# Patient Record
Sex: Female | Born: 1945 | Race: White | Hispanic: No | Marital: Married | Smoking: Current every day smoker
Health system: Southern US, Community
[De-identification: ages and names within clinical notes are randomized; demographics above are authoritative.]

## PROBLEM LIST (undated history)

## (undated) DIAGNOSIS — T7840XA Allergy, unspecified, initial encounter: Secondary | ICD-10-CM

## (undated) DIAGNOSIS — J45909 Unspecified asthma, uncomplicated: Secondary | ICD-10-CM

## (undated) DIAGNOSIS — E079 Disorder of thyroid, unspecified: Secondary | ICD-10-CM

## (undated) DIAGNOSIS — M1990 Unspecified osteoarthritis, unspecified site: Secondary | ICD-10-CM

## (undated) HISTORY — PX: OTHER SURGICAL HISTORY: SHX169

## (undated) HISTORY — PX: BREAST SURGERY: SHX581

## (undated) HISTORY — DX: Unspecified osteoarthritis, unspecified site: M19.90

## (undated) HISTORY — DX: Allergy, unspecified, initial encounter: T78.40XA

## (undated) HISTORY — PX: KNEE SURGERY: SHX244

## (undated) HISTORY — DX: Unspecified asthma, uncomplicated: J45.909

## (undated) HISTORY — DX: Disorder of thyroid, unspecified: E07.9

---

## 1969-06-09 HISTORY — PX: BREAST BIOPSY: SHX20

## 2011-07-24 ENCOUNTER — Ambulatory Visit: Payer: Self-pay

## 2011-09-22 ENCOUNTER — Ambulatory Visit: Payer: Self-pay | Admitting: Family Medicine

## 2011-09-22 LAB — TSH: Thyroid Stimulating Horm: 1.55 u[IU]/mL

## 2011-09-22 LAB — CBC WITH DIFFERENTIAL/PLATELET
Basophil %: 1.1 %
Eosinophil #: 0.2 10*3/uL (ref 0.0–0.7)
Eosinophil %: 3.1 %
HCT: 39.3 % (ref 35.0–47.0)
HGB: 13.1 g/dL (ref 12.0–16.0)
MCH: 31.6 pg (ref 26.0–34.0)
MCHC: 33.3 g/dL (ref 32.0–36.0)
Monocyte %: 6.6 %
Neutrophil #: 3.7 10*3/uL (ref 1.4–6.5)
Neutrophil %: 57.5 %
Platelet: 200 10*3/uL (ref 150–440)
RBC: 4.14 10*6/uL (ref 3.80–5.20)
RDW: 12.5 % (ref 11.5–14.5)
WBC: 6.4 10*3/uL (ref 3.6–11.0)

## 2011-09-22 LAB — COMPREHENSIVE METABOLIC PANEL
BUN: 12 mg/dL (ref 7–18)
Calcium, Total: 9.1 mg/dL (ref 8.5–10.1)
Chloride: 106 mmol/L (ref 98–107)
Co2: 28 mmol/L (ref 21–32)
Creatinine: 0.73 mg/dL (ref 0.60–1.30)
EGFR (African American): >60
Glucose: 100 mg/dL — ABNORMAL HIGH (ref 65–99)
Osmolality: 287 (ref 275–301)
Potassium: 3.9 mmol/L (ref 3.5–5.1)
SGOT(AST): 23 U/L (ref 15–37)
Total Protein: 6.8 g/dL (ref 6.4–8.2)

## 2011-09-22 LAB — LIPID PANEL
Cholesterol: 183 mg/dL (ref 0–200)
HDL Cholesterol: 63 mg/dL — ABNORMAL HIGH (ref 40–60)
Ldl Cholesterol, Calc: 102 mg/dL — ABNORMAL HIGH (ref 0–100)
Triglycerides: 90 mg/dL (ref 0–200)
VLDL Cholesterol, Calc: 18 mg/dL (ref 5–40)

## 2012-03-18 ENCOUNTER — Ambulatory Visit: Payer: Self-pay | Admitting: Unknown Physician Specialty

## 2012-03-18 LAB — CBC WITH DIFFERENTIAL/PLATELET
Eosinophil %: 0.6 %
HGB: 7.7 g/dL — ABNORMAL LOW (ref 12.0–16.0)
Lymphocyte %: 20.1 %
MCHC: 33.6 g/dL (ref 32.0–36.0)
MCV: 97 fL (ref 80–100)
Monocyte %: 6 %
Platelet: 387 10*3/uL (ref 150–440)
RDW: 15.7 % — ABNORMAL HIGH (ref 11.5–14.5)
WBC: 7.3 10*3/uL (ref 3.6–11.0)

## 2012-03-19 ENCOUNTER — Ambulatory Visit: Payer: Self-pay | Admitting: Unknown Physician Specialty

## 2012-03-19 HISTORY — PX: COLONOSCOPY: SHX174

## 2012-03-24 LAB — PATHOLOGY REPORT

## 2012-05-03 ENCOUNTER — Ambulatory Visit: Payer: Self-pay | Admitting: Family Medicine

## 2012-05-03 LAB — CBC WITH DIFFERENTIAL/PLATELET
Eosinophil %: 1.6 %
HCT: 36.9 % (ref 35.0–47.0)
Lymphocyte %: 18 %
MCH: 29.6 pg (ref 26.0–34.0)
MCHC: 32.8 g/dL (ref 32.0–36.0)
Monocyte #: 0.6 x10 3/mm (ref 0.2–0.9)
Monocyte %: 6.5 %
Neutrophil %: 73 %
Platelet: 225 10*3/uL (ref 150–440)
RBC: 4.09 10*6/uL (ref 3.80–5.20)
RDW: 16.4 % — ABNORMAL HIGH (ref 11.5–14.5)

## 2012-09-20 ENCOUNTER — Ambulatory Visit: Payer: Self-pay | Admitting: Family Medicine

## 2012-09-20 LAB — CBC WITH DIFFERENTIAL/PLATELET
Basophil #: 0.1 10*3/uL (ref 0.0–0.1)
Basophil %: 0.6 %
Eosinophil #: 0.1 10*3/uL (ref 0.0–0.7)
Eosinophil %: 1.1 %
HCT: 37.9 % (ref 35.0–47.0)
Lymphocyte %: 23.9 %
MCH: 32.4 pg (ref 26.0–34.0)
MCV: 96 fL (ref 80–100)
Monocyte #: 0.6 x10 3/mm (ref 0.2–0.9)
Monocyte %: 6.8 %
Neutrophil #: 6.2 10*3/uL (ref 1.4–6.5)
Neutrophil %: 67.6 %
Platelet: 233 10*3/uL (ref 150–440)
RBC: 3.95 10*6/uL (ref 3.80–5.20)

## 2012-09-20 LAB — TSH: Thyroid Stimulating Horm: 0.798 u[IU]/mL

## 2013-04-11 ENCOUNTER — Ambulatory Visit: Payer: Self-pay | Admitting: Family Medicine

## 2013-04-11 LAB — TSH: Thyroid Stimulating Horm: 1.27 u[IU]/mL

## 2013-04-13 ENCOUNTER — Ambulatory Visit: Payer: Self-pay | Admitting: Family Medicine

## 2014-03-08 ENCOUNTER — Ambulatory Visit: Payer: Self-pay | Admitting: Family Medicine

## 2014-03-08 LAB — CBC WITH DIFFERENTIAL/PLATELET
BASOS PCT: 1.4 %
Basophil #: 0.2 10*3/uL — ABNORMAL HIGH (ref 0.0–0.1)
EOS ABS: 0.1 10*3/uL (ref 0.0–0.7)
Eosinophil %: 0.8 %
HCT: 43.9 % (ref 35.0–47.0)
HGB: 14.6 g/dL (ref 12.0–16.0)
LYMPHS PCT: 26.5 %
Lymphocyte #: 3.5 10*3/uL (ref 1.0–3.6)
MCH: 32.4 pg (ref 26.0–34.0)
MCHC: 33.3 g/dL (ref 32.0–36.0)
MCV: 97 fL (ref 80–100)
Monocyte #: 1 x10 3/mm — ABNORMAL HIGH (ref 0.2–0.9)
Monocyte %: 7.6 %
Neutrophil #: 8.3 10*3/uL — ABNORMAL HIGH (ref 1.4–6.5)
Neutrophil %: 63.7 %
Platelet: 246 10*3/uL (ref 150–440)
RBC: 4.51 10*6/uL (ref 3.80–5.20)
RDW: 12.1 % (ref 11.5–14.5)
WBC: 13.1 10*3/uL — ABNORMAL HIGH (ref 3.6–11.0)

## 2014-03-08 LAB — COMPREHENSIVE METABOLIC PANEL
ALK PHOS: 69 U/L
ALT: 19 U/L
ANION GAP: 9 (ref 7–16)
AST: 20 U/L (ref 15–37)
Albumin: 3.5 g/dL (ref 3.4–5.0)
BUN: 17 mg/dL (ref 7–18)
Bilirubin,Total: 0.3 mg/dL (ref 0.2–1.0)
CALCIUM: 9.6 mg/dL (ref 8.5–10.1)
Chloride: 104 mmol/L (ref 98–107)
Co2: 26 mmol/L (ref 21–32)
Creatinine: 0.8 mg/dL (ref 0.60–1.30)
EGFR (African American): >60
Glucose: 106 mg/dL — ABNORMAL HIGH (ref 65–99)
Osmolality: 280 (ref 275–301)
Potassium: 3.9 mmol/L (ref 3.5–5.1)
Sodium: 139 mmol/L (ref 136–145)
TOTAL PROTEIN: 6.7 g/dL (ref 6.4–8.2)

## 2014-03-08 LAB — LIPID PANEL
Cholesterol: 201 mg/dL — ABNORMAL HIGH (ref 0–200)
HDL Cholesterol: 67 mg/dL — ABNORMAL HIGH (ref 40–60)
Ldl Cholesterol, Calc: 106 mg/dL — ABNORMAL HIGH (ref 0–100)
TRIGLYCERIDES: 140 mg/dL (ref 0–200)
VLDL Cholesterol, Calc: 28 mg/dL (ref 5–40)

## 2014-03-08 LAB — TSH: Thyroid Stimulating Horm: 1.11 u[IU]/mL

## 2014-03-27 ENCOUNTER — Ambulatory Visit: Payer: Self-pay | Admitting: Family Medicine

## 2014-03-27 LAB — CBC WITH DIFFERENTIAL/PLATELET
Basophil #: 0.1 10*3/uL (ref 0.0–0.1)
Basophil %: 0.5 %
Eosinophil #: 0.1 10*3/uL (ref 0.0–0.7)
Eosinophil %: 1 %
HCT: 42.4 % (ref 35.0–47.0)
HGB: 14.2 g/dL (ref 12.0–16.0)
Lymphocyte #: 2.8 10*3/uL (ref 1.0–3.6)
Lymphocyte %: 26.8 %
MCH: 32.4 pg (ref 26.0–34.0)
MCHC: 33.5 g/dL (ref 32.0–36.0)
MCV: 97 fL (ref 80–100)
MONOS PCT: 5.5 %
Monocyte #: 0.6 x10 3/mm (ref 0.2–0.9)
NEUTROS ABS: 6.8 10*3/uL — AB (ref 1.4–6.5)
NEUTROS PCT: 66.2 %
Platelet: 236 10*3/uL (ref 150–440)
RBC: 4.39 10*6/uL (ref 3.80–5.20)
RDW: 12.1 % (ref 11.5–14.5)
WBC: 10.3 10*3/uL (ref 3.6–11.0)

## 2014-03-31 ENCOUNTER — Ambulatory Visit: Payer: Self-pay | Admitting: Family Medicine

## 2014-10-03 ENCOUNTER — Ambulatory Visit
Admit: 2014-10-03 | Disposition: A | Discharge status: Home / Self Care | Payer: Self-pay | Attending: Family Medicine | Admitting: Family Medicine

## 2014-10-03 LAB — CBC WITH DIFFERENTIAL/PLATELET
BASOS PCT: 0.6 %
Basophil #: 0.1 10*3/uL (ref 0.0–0.1)
Eosinophil #: 0.1 10*3/uL (ref 0.0–0.7)
Eosinophil %: 0.7 %
HCT: 41 % (ref 35.0–47.0)
HGB: 13.7 g/dL (ref 12.0–16.0)
LYMPHS ABS: 2.7 10*3/uL (ref 1.0–3.6)
Lymphocyte %: 24.1 %
MCH: 31.7 pg (ref 26.0–34.0)
MCHC: 33.3 g/dL (ref 32.0–36.0)
MCV: 95 fL (ref 80–100)
MONOS PCT: 5.4 %
Monocyte #: 0.6 x10 3/mm (ref 0.2–0.9)
Neutrophil #: 7.8 10*3/uL — ABNORMAL HIGH (ref 1.4–6.5)
Neutrophil %: 69.2 %
Platelet: 251 10*3/uL (ref 150–440)
RBC: 4.31 10*6/uL (ref 3.80–5.20)
RDW: 12.4 % (ref 11.5–14.5)
WBC: 11.2 10*3/uL — AB (ref 3.6–11.0)

## 2014-10-03 LAB — HEMOGLOBIN A1C: HEMOGLOBIN A1C: 5.3 %

## 2014-11-08 ENCOUNTER — Telehealth: Payer: Self-pay

## 2014-11-08 NOTE — Telephone Encounter (Signed)
Go ahead and refer her to [facility] for her neck pain.  Let me know if any problems with this referral.-jh

## 2014-11-08 NOTE — Telephone Encounter (Signed)
Patient called on 11/07/2014 requesting call back regarding neck pain. Patient states that she discussed this in April with Dr. Luan Pulling and was told to call back for referral if neck pain does not improve. I asked her if it was discussed where she would be sent and she said she was not sure but is willing to go wherever we decide to send her. I advised her that I will only call back if doctor requires me to schedule appt or give medical advise otherwise she will hear from specialist after referral is sent. Patient was also advised of EPIC changes and she will call us if she hears nothing for more than a week.

## 2014-11-10 ENCOUNTER — Other Ambulatory Visit: Payer: Self-pay | Admitting: Family Medicine

## 2014-11-10 ENCOUNTER — Other Ambulatory Visit: Payer: Self-pay

## 2014-11-10 DIAGNOSIS — M542 Cervicalgia: Secondary | ICD-10-CM

## 2014-11-17 ENCOUNTER — Telehealth: Payer: Self-pay | Admitting: Family Medicine

## 2014-11-17 NOTE — Telephone Encounter (Signed)
Pt, called  Wanting to  Know  About referral  For  Neck  Pain . PT call  Back # is  [phone].  Thanks

## 2014-11-20 NOTE — Telephone Encounter (Signed)
What I treated her for in April 2016 was muscular pain in neck.  There was no mention by me of any vascular problems.  There was no vascular abnormalities on her exam.  I am unsure of what she speaks.There was no indication that she had any carotid problems. I don't have anything documented that would justify to insurance a referral to Card. Or Vascular.  If she is having more problems, I probably need to see her again to discuss and evaluate her neck again.

## 2014-11-20 NOTE — Telephone Encounter (Signed)
Patient called about wanting referral that you discussed at April visit. She then called to check on this ref that I sent for neck pain on 11/10/2014. When I explained Ortho is working on this she said you told her it is Carotid pain and this should not be ortho. Advised to come in and she refused stating this was discussed at last OV.  I can cancel Ortho if need to but do we send to Cardio?

## 2014-11-21 NOTE — Telephone Encounter (Signed)
Left detailed message to call and schedule appt if she feels this is new symptom otherwise all we can do is send her to Ortho. [facility]

## 2014-11-24 ENCOUNTER — Ambulatory Visit: Payer: Self-pay | Admitting: Family Medicine

## 2014-11-28 ENCOUNTER — Encounter: Payer: Self-pay | Admitting: Family Medicine

## 2014-11-28 ENCOUNTER — Ambulatory Visit (INDEPENDENT_AMBULATORY_CARE_PROVIDER_SITE_OTHER): Payer: Medicare Other | Admitting: Family Medicine

## 2014-11-28 VITALS — BP 97/67 | HR 76 | Resp 16 | Ht 64.0 in | Wt 119.4 lb

## 2014-11-28 DIAGNOSIS — E039 Hypothyroidism, unspecified: Secondary | ICD-10-CM

## 2014-11-28 DIAGNOSIS — J4520 Mild intermittent asthma, uncomplicated: Secondary | ICD-10-CM | POA: Diagnosis not present

## 2014-11-28 DIAGNOSIS — R591 Generalized enlarged lymph nodes: Secondary | ICD-10-CM | POA: Diagnosis not present

## 2014-11-28 DIAGNOSIS — M542 Cervicalgia: Secondary | ICD-10-CM

## 2014-11-28 DIAGNOSIS — J45909 Unspecified asthma, uncomplicated: Secondary | ICD-10-CM | POA: Insufficient documentation

## 2014-11-28 MED ORDER — ALBUTEROL SULFATE HFA 108 (90 BASE) MCG/ACT IN AERS: 2.0000 | INHALATION_SPRAY | Freq: Four times a day (QID) | RESPIRATORY_TRACT | Status: DC | PRN

## 2014-11-28 NOTE — Progress Notes (Signed)
Name: Lindsey Golden   MRN: [id number]    DOB: [birth year]   Date:11/28/2014       Progress Note  Subjective  Chief Complaint  Chief Complaint  Patient presents with  . Neck Pain    2-3 months off and on.     HPI  [name] neck pain, on and off.  Less than 2 mos. Ago, but persistent.  Pain on awakening, but resolves.  Nothing makes it worse.   Past Medical History  Diagnosis Date  . Asthma     History  Substance Use Topics  . Smoking status: Current Some Day Smoker -- 0.25 packs/day    Types: Cigarettes  . Smokeless tobacco: Never Used  . Alcohol Use: No     Current outpatient prescriptions:  .  levothyroxine (SYNTHROID, LEVOTHROID) 75 MCG tablet, Take 75 mcg by mouth daily before breakfast., Disp: , Rfl:  .  albuterol (PROVENTIL HFA;VENTOLIN HFA) 108 (90 BASE) MCG/ACT inhaler, Inhale 2 puffs into the lungs every 6 (six) hours as needed for wheezing or shortness of breath., Disp: 1 Inhaler, Rfl: 12  Allergies  Allergen Reactions  . Aspirin Hives  . Penicillins Other (See Comments)    Review of Systems  Constitutional: Negative.  Negative for fever, chills and malaise/fatigue.  HENT: Negative.  Negative for congestion and sore throat.   Eyes: Negative.  Negative for blurred vision and double vision.  Respiratory: Negative.  Negative for shortness of breath and wheezing.   Cardiovascular: Negative.  Negative for chest pain and orthopnea.  Gastrointestinal: Negative.  Negative for abdominal pain.  Skin: Negative.  Negative for rash.  Neurological: Negative for weakness and headaches.      Objective  Filed Vitals:   11/28/14 0928  BP: 97/67  Pulse: 76  Resp: 16  Height: 5\' 4"  (1.626 m)  Weight: 119 lb 6.4 oz (54.159 kg)     Physical Exam  Constitutional: She is well-developed, well-nourished, and in no distress. No distress.  HENT:  Right Ear: Hearing, tympanic membrane, external ear and ear canal normal.  Left Ear: Hearing, tympanic  membrane, external ear and ear canal normal.  Nose: Nose normal.  Mouth/Throat: Uvula is midline, oropharynx is clear and moist and mucous membranes are normal.  Lymphadenopathy:       Head (right side): Submandibular (Minimally enlarded compared to [name].  No other masses.) adenopathy present.  Vitals reviewed.     Recent Results (from the past 2160 hour(s))  Hemoglobin A1c     Status: None   Collection Time: 10/03/14  9:37 AM  Result Value Ref Range   Hemoglobin A1C 5.3 %    Comment: 4.0-6.0 NOTE: New Reference Range  08/15/14   CBC with Differential/Platelet     Status: Abnormal   Collection Time: 10/03/14  9:37 AM  Result Value Ref Range   WBC 11.2 (H) 3.6-11.0 x10 3/mm 3   RBC 4.31 3.80-5.20 X10 6/mm 3   HGB 13.7 12.0-16.0 g/dL   HCT 41.0 35.0-47.0 %   MCV 95 80-100 fL   MCH 31.7 26.0-34.0 pg   MCHC 33.3 32.0-36.0 g/dL   RDW 12.4 11.5-14.5 %   Platelet 251 150-440 x10 3/mm 3   Neutrophil % 69.2 %   Lymphocyte % 24.1 %   Monocyte % 5.4 %   Eosinophil % 0.7 %   Basophil % 0.6 %   Neutrophil # 7.8 (H) 1.4-6.5 x10 3/mm 3   Lymphocyte # 2.7 1.0-3.6 x10 3/mm 3  Monocyte # 0.6 0.2-0.9 x10 3/mm    Eosinophil # 0.1 0.0-0.7 x10 3/mm 3   Basophil # 0.1 0.0-0.1 x10 3/mm 3     Assessment & Plan  Neck pain Lymphadenopathy 2. Hypothyroidism, unspecified hypothyroidism type   3. Asthma, mild intermittent, uncomplicated  - albuterol (PROVENTIL HFA;VENTOLIN HFA) 108 (90 BASE) MCG/ACT inhaler; Inhale 2 puffs into the lungs every 6 (six) hours as needed for wheezing or shortness of breath.  Dispense: 1 Inhaler; Refill: 12

## 2014-11-28 NOTE — Patient Instructions (Signed)
Hot wet compresses to neck PRN.  Also Tylenol, 500 mg., 2 up to three times a day PRN.

## 2014-11-29 ENCOUNTER — Telehealth: Payer: Self-pay | Admitting: Family Medicine

## 2014-11-29 DIAGNOSIS — J45909 Unspecified asthma, uncomplicated: Secondary | ICD-10-CM

## 2014-11-29 NOTE — Telephone Encounter (Signed)
Pt states during her 11/28/14 appointment with Dr Luan Pulling they discussed an inhaler that would be covered by Medicare. Pt states she would like for the Medicare covered inhaler to be called into her pharmacy.  [id number] - [place], [address]

## 2014-11-30 NOTE — Telephone Encounter (Signed)
Please review and let me know if she needs appt.[facility]

## 2014-12-01 NOTE — Telephone Encounter (Signed)
She was given a Ventolin inhaler.  I guess change this to a ProAir inhaler to her pharmacy.   Ask if covered better by Medicare.-jh

## 2014-12-07 MED ORDER — ALBUTEROL SULFATE HFA 108 (90 BASE) MCG/ACT IN AERS: 2.0000 | INHALATION_SPRAY | Freq: Four times a day (QID) | RESPIRATORY_TRACT | Status: DC | PRN

## 2014-12-07 NOTE — Telephone Encounter (Signed)
Done. Covered level 8

## 2015-03-06 ENCOUNTER — Other Ambulatory Visit: Payer: Self-pay | Admitting: Family Medicine

## 2015-03-06 DIAGNOSIS — J45909 Unspecified asthma, uncomplicated: Secondary | ICD-10-CM

## 2015-03-06 MED ORDER — ALBUTEROL SULFATE HFA 108 (90 BASE) MCG/ACT IN AERS: 2.0000 | INHALATION_SPRAY | Freq: Four times a day (QID) | RESPIRATORY_TRACT | Status: DC | PRN

## 2015-03-06 NOTE — Telephone Encounter (Signed)
Received refill request from [facility] for [place]. Rx submitted.[place]

## 2015-04-05 ENCOUNTER — Ambulatory Visit: Payer: Medicare Other | Admitting: Family Medicine

## 2015-04-05 ENCOUNTER — Ambulatory Visit (INDEPENDENT_AMBULATORY_CARE_PROVIDER_SITE_OTHER): Payer: Medicare Other

## 2015-04-05 DIAGNOSIS — Z23 Encounter for immunization: Secondary | ICD-10-CM | POA: Diagnosis not present

## 2015-04-05 DIAGNOSIS — E039 Hypothyroidism, unspecified: Secondary | ICD-10-CM

## 2015-04-05 DIAGNOSIS — E785 Hyperlipidemia, unspecified: Secondary | ICD-10-CM

## 2015-04-05 DIAGNOSIS — R739 Hyperglycemia, unspecified: Secondary | ICD-10-CM

## 2015-04-05 DIAGNOSIS — J45909 Unspecified asthma, uncomplicated: Secondary | ICD-10-CM

## 2015-04-09 DIAGNOSIS — J45909 Unspecified asthma, uncomplicated: Secondary | ICD-10-CM | POA: Diagnosis not present

## 2015-04-09 DIAGNOSIS — E039 Hypothyroidism, unspecified: Secondary | ICD-10-CM | POA: Diagnosis not present

## 2015-04-09 DIAGNOSIS — R7309 Other abnormal glucose: Secondary | ICD-10-CM | POA: Diagnosis not present

## 2015-04-09 DIAGNOSIS — E785 Hyperlipidemia, unspecified: Secondary | ICD-10-CM | POA: Diagnosis not present

## 2015-04-10 LAB — CBC WITH DIFFERENTIAL/PLATELET
BASOS: 1 %
Basophils Absolute: 0 10*3/uL (ref 0.0–0.2)
EOS (ABSOLUTE): 0.2 10*3/uL (ref 0.0–0.4)
Eos: 2 %
Hematocrit: 44 % (ref 34.0–46.6)
Hemoglobin: 14.3 g/dL (ref 11.1–15.9)
Immature Grans (Abs): 0 10*3/uL (ref 0.0–0.1)
Immature Granulocytes: 0 %
Lymphocytes Absolute: 2.4 10*3/uL (ref 0.7–3.1)
Lymphs: 32 %
MCH: 31.6 pg (ref 26.6–33.0)
MCHC: 32.5 g/dL (ref 31.5–35.7)
MCV: 97 fL (ref 79–97)
MONOS ABS: 0.4 10*3/uL (ref 0.1–0.9)
Monocytes: 6 %
NEUTROS ABS: 4.4 10*3/uL (ref 1.4–7.0)
Neutrophils: 59 %
PLATELETS: 286 10*3/uL (ref 150–379)
RBC: 4.53 x10E6/uL (ref 3.77–5.28)
RDW: 12.6 % (ref 12.3–15.4)
WBC: 7.4 10*3/uL (ref 3.4–10.8)

## 2015-04-10 LAB — COMPREHENSIVE METABOLIC PANEL
A/G RATIO: 1.6 (ref 1.1–2.5)
ALT: 11 IU/L (ref 0–32)
AST: 23 IU/L (ref 0–40)
Albumin: 4 g/dL (ref 3.6–4.8)
Alkaline Phosphatase: 69 IU/L (ref 39–117)
BUN/Creatinine Ratio: 20 (ref 11–26)
BUN: 13 mg/dL (ref 8–27)
Bilirubin Total: 0.5 mg/dL (ref 0.0–1.2)
CHLORIDE: 101 mmol/L (ref 97–106)
CO2: 25 mmol/L (ref 18–29)
Calcium: 8.9 mg/dL (ref 8.7–10.3)
Creatinine, Ser: 0.65 mg/dL (ref 0.57–1.00)
GFR calc Af Amer: 106 mL/min/{1.73_m2} (ref >59)
GFR calc non Af Amer: 92 mL/min/{1.73_m2} (ref >59)
Globulin, Total: 2.5 g/dL (ref 1.5–4.5)
Glucose: 97 mg/dL (ref 65–99)
Potassium: 4.5 mmol/L (ref 3.5–5.2)
SODIUM: 141 mmol/L (ref 136–144)
TOTAL PROTEIN: 6.5 g/dL (ref 6.0–8.5)

## 2015-04-10 LAB — LIPID PANEL
CHOL/HDL RATIO: 3.2 ratio (ref 0.0–4.4)
Cholesterol, Total: 212 mg/dL — ABNORMAL HIGH (ref 100–199)
HDL: 67 mg/dL (ref >39)
LDL CALC: 115 mg/dL — AB (ref 0–99)
Triglycerides: 150 mg/dL — ABNORMAL HIGH (ref 0–149)
VLDL Cholesterol Cal: 30 mg/dL (ref 5–40)

## 2015-04-10 LAB — HEMOGLOBIN A1C: Hgb A1c MFr Bld: 5.6 % (ref 4.8–5.6)

## 2015-04-10 LAB — TSH: TSH: 1.77 u[IU]/mL (ref 0.450–4.500)

## 2015-06-07 ENCOUNTER — Other Ambulatory Visit: Payer: Self-pay | Admitting: Family Medicine

## 2015-06-08 ENCOUNTER — Telehealth: Payer: Self-pay | Admitting: Family Medicine

## 2015-06-08 MED ORDER — LEVOTHYROXINE SODIUM 75 MCG PO TABS: 75.0000 ug | ORAL_TABLET | Freq: Every day | ORAL | Status: DC

## 2015-06-08 NOTE — Telephone Encounter (Signed)
Pt needs a new prescription for lexothyroxine sent to [facility] in [place].  She will be out of medication tomorrow.  Her call back number is [phone]

## 2015-06-08 NOTE — Telephone Encounter (Signed)
Done

## 2015-08-09 ENCOUNTER — Other Ambulatory Visit: Payer: Self-pay | Admitting: Family Medicine

## 2015-08-09 MED ORDER — LEVOTHYROXINE SODIUM 75 MCG PO TABS: 75.0000 ug | ORAL_TABLET | Freq: Every day | ORAL | Status: DC

## 2015-11-26 ENCOUNTER — Encounter: Payer: Self-pay | Admitting: Family Medicine

## 2015-11-26 ENCOUNTER — Ambulatory Visit (INDEPENDENT_AMBULATORY_CARE_PROVIDER_SITE_OTHER): Payer: Medicare Other | Admitting: Family Medicine

## 2015-11-26 VITALS — BP 113/77 | HR 73 | Temp 98.1°F | Resp 16 | Ht 64.0 in | Wt 121.0 lb

## 2015-11-26 DIAGNOSIS — M1990 Unspecified osteoarthritis, unspecified site: Secondary | ICD-10-CM

## 2015-11-26 DIAGNOSIS — J45909 Unspecified asthma, uncomplicated: Secondary | ICD-10-CM

## 2015-11-26 DIAGNOSIS — E034 Atrophy of thyroid (acquired): Secondary | ICD-10-CM

## 2015-11-26 DIAGNOSIS — E038 Other specified hypothyroidism: Secondary | ICD-10-CM | POA: Diagnosis not present

## 2015-11-26 DIAGNOSIS — M170 Bilateral primary osteoarthritis of knee: Secondary | ICD-10-CM | POA: Insufficient documentation

## 2015-11-26 MED ORDER — LEVOTHYROXINE SODIUM 75 MCG PO TABS: 75.0000 ug | ORAL_TABLET | Freq: Every day | ORAL | Status: DC

## 2015-11-26 MED ORDER — ALBUTEROL SULFATE HFA 108 (90 BASE) MCG/ACT IN AERS: 2.0000 | INHALATION_SPRAY | Freq: Four times a day (QID) | RESPIRATORY_TRACT | Status: DC | PRN

## 2015-11-26 MED ORDER — MELOXICAM 7.5 MG PO TABS: 7.5000 mg | ORAL_TABLET | Freq: Every day | ORAL | Status: DC

## 2015-11-26 NOTE — Patient Instructions (Signed)
Plan CMP, CBC, TSH, Lipid panel on return

## 2015-11-26 NOTE — Progress Notes (Signed)
Name: Lindsey Golden   MRN: [id number]    DOB: 1945-12-24   Date:11/26/2015       Progress Note  Subjective  Chief Complaint  Chief Complaint  Patient presents with  . Hypothyroidism    HPI Here for f/u of hypothyroidism and Asthma.  She is a smoker and has not been able to stop.  She has to use her ProAir about once a day.  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  She has had multiple knee surgeries and needs total knee replacement.  She is having more problems with her knees.  No problem-specific assessment & plan notes found for this encounter.   Past Medical History  Diagnosis Date  . Asthma     History reviewed. No pertinent past surgical history.  Family History  Problem Relation Age of Onset  . Family history unknown: Yes    Social History   Social History  . Marital Status: Married    Spouse Name: N/A  . Number of Children: N/A  . Years of Education: N/A   Occupational History  . Not on file.   Social History Main Topics  . Smoking status: Current Some Day Smoker -- 0.50 packs/day    Types: Cigarettes  . Smokeless tobacco: Never Used  . Alcohol Use: No  . Drug Use: No  . Sexual Activity: No   Other Topics Concern  . Not on file   Social History Narrative     Current outpatient prescriptions:  .  albuterol (PROAIR HFA) 108 (90 Base) MCG/ACT inhaler, Inhale 2 puffs into the lungs every 6 (six) hours as needed for wheezing or shortness of breath. ProAir, Disp: 1 Inhaler, Rfl: 12 .  levothyroxine (SYNTHROID, LEVOTHROID) 75 MCG tablet, Take 1 tablet (75 mcg total) by mouth daily., Disp: 90 tablet, Rfl: 3 .  meloxicam (MOBIC) 7.5 MG tablet, Take 1 tablet (7.5 mg total) by mouth daily. Take with food, Disp: 30 tablet, Rfl: 6  Allergies  Allergen Reactions  . Aspirin Hives  . Penicillins Other (See Comments)     Review of Systems  Constitutional: Negative for fever, chills, weight loss and malaise/fatigue.   HENT: Negative for hearing loss.   Eyes: Negative for blurred vision and double vision.  Respiratory: Positive for cough (occ) and wheezing (with activity and heat). Negative for shortness of breath.   Cardiovascular: Negative for chest pain, palpitations and leg swelling.  Gastrointestinal: Negative for heartburn, abdominal pain and constipation.  Genitourinary: Negative for dysuria, urgency and hematuria.  Musculoskeletal: Positive for joint pain (knees).  Skin: Negative for rash.  Neurological: Negative for dizziness, tremors, weakness and headaches.      Objective  Filed Vitals:   11/26/15 1417  BP: 113/77  Pulse: 73  Temp: 98.1 F (36.7 C)  TempSrc: Oral  Resp: 16  Height: 5\' 4"  (1.626 m)  Weight: 121 lb (54.885 kg)    Physical Exam  Constitutional: She is oriented to person, place, and time and well-developed, well-nourished, and in no distress. No distress.  HENT:  Head: Normocephalic and atraumatic.  Eyes: Conjunctivae and EOM are normal. Pupils are equal, round, and reactive to light. No scleral icterus.  Neck: Normal range of motion. Neck supple. Carotid bruit is not present. No thyromegaly present.  Cardiovascular: Normal rate, regular rhythm and normal heart sounds.  Exam reveals no gallop and no friction rub.   No murmur heard. Pulmonary/Chest: Effort normal and breath sounds normal. No respiratory distress. She  has no wheezes. She has no rales.  Abdominal: Soft. Bowel sounds are normal. She exhibits no distension and no mass. There is no tenderness.  Musculoskeletal: She exhibits no edema.  Bilateral knee pain with ROM  Lymphadenopathy:    She has no cervical adenopathy.  Neurological: She is alert and oriented to person, place, and time.  Vitals reviewed.      No results found for this or any previous visit (from the past 2160 hour(s)).   Assessment & Plan  Problem List Items Addressed This Visit      Respiratory   Asthma - Primary   Relevant  Medications   albuterol (PROAIR HFA) 108 (90 Base) MCG/ACT inhaler     Endocrine   Hypothyroidism   Relevant Medications   levothyroxine (SYNTHROID, LEVOTHROID) 75 MCG tablet     Musculoskeletal and Integument   Arthritis   Relevant Medications   meloxicam (MOBIC) 7.5 MG tablet      Meds ordered this encounter  Medications  . levothyroxine (SYNTHROID, LEVOTHROID) 75 MCG tablet    Sig: Take 1 tablet (75 mcg total) by mouth daily.    Dispense:  90 tablet    Refill:  3  . albuterol (PROAIR HFA) 108 (90 Base) MCG/ACT inhaler    Sig: Inhale 2 puffs into the lungs every 6 (six) hours as needed for wheezing or shortness of breath. ProAir    Dispense:  1 Inhaler    Refill:  12  . meloxicam (MOBIC) 7.5 MG tablet    Sig: Take 1 tablet (7.5 mg total) by mouth daily. Take with food    Dispense:  30 tablet    Refill:  6   1. Asthma, unspecified asthma severity, uncomplicated  - albuterol (PROAIR HFA) 108 (90 Base) MCG/ACT inhaler; Inhale 2 puffs into the lungs every 6 (six) hours as needed for wheezing or shortness of breath. ProAir  Dispense: 1 Inhaler; Refill: 12 Discussed stopping smoking again. 2. Arthritis  - meloxicam (MOBIC) 7.5 MG tablet; Take 1 tablet (7.5 mg total) by mouth daily. Take with food  Dispense: 30 tablet; Refill: 6  3. Hypothyroidism due to acquired atrophy of thyroid  - levothyroxine (SYNTHROID, LEVOTHROID) 75 MCG tablet; Take 1 tablet (75 mcg total) by mouth daily.  Dispense: 90 tablet; Refill: 3

## 2016-02-15 ENCOUNTER — Other Ambulatory Visit: Payer: Self-pay | Admitting: Family Medicine

## 2016-02-15 ENCOUNTER — Other Ambulatory Visit: Payer: Self-pay | Admitting: *Deleted

## 2016-02-15 DIAGNOSIS — E039 Hypothyroidism, unspecified: Secondary | ICD-10-CM

## 2016-02-15 DIAGNOSIS — M542 Cervicalgia: Secondary | ICD-10-CM

## 2016-02-15 DIAGNOSIS — M1990 Unspecified osteoarthritis, unspecified site: Secondary | ICD-10-CM

## 2016-02-18 ENCOUNTER — Other Ambulatory Visit: Payer: Medicare Other

## 2016-02-18 ENCOUNTER — Other Ambulatory Visit: Payer: Self-pay | Admitting: Family Medicine

## 2016-02-18 DIAGNOSIS — M542 Cervicalgia: Secondary | ICD-10-CM | POA: Diagnosis not present

## 2016-02-18 DIAGNOSIS — E039 Hypothyroidism, unspecified: Secondary | ICD-10-CM | POA: Diagnosis not present

## 2016-02-18 DIAGNOSIS — M1990 Unspecified osteoarthritis, unspecified site: Secondary | ICD-10-CM | POA: Diagnosis not present

## 2016-02-18 LAB — CBC WITH DIFFERENTIAL/PLATELET
BASOS PCT: 0 %
Basophils Absolute: 0 cells/uL (ref 0–200)
EOS ABS: 192 {cells}/uL (ref 15–500)
Eosinophils Relative: 2 %
HEMATOCRIT: 40.7 % (ref 35.0–45.0)
HEMOGLOBIN: 13.7 g/dL (ref 11.7–15.5)
LYMPHS ABS: 2880 {cells}/uL (ref 850–3900)
LYMPHS PCT: 30 %
MCH: 31.8 pg (ref 27.0–33.0)
MCHC: 33.7 g/dL (ref 32.0–36.0)
MCV: 94.4 fL (ref 80.0–100.0)
MONO ABS: 672 {cells}/uL (ref 200–950)
MPV: 10.1 fL (ref 7.5–12.5)
Monocytes Relative: 7 %
NEUTROS ABS: 5856 {cells}/uL (ref 1500–7800)
Neutrophils Relative %: 61 %
Platelets: 264 10*3/uL (ref 140–400)
RBC: 4.31 MIL/uL (ref 3.80–5.10)
RDW: 12.3 % (ref 11.0–15.0)
WBC: 9.6 10*3/uL (ref 3.8–10.8)

## 2016-02-18 NOTE — Progress Notes (Signed)
Error

## 2016-02-19 LAB — COMPLETE METABOLIC PANEL WITH GFR
ALT: 10 U/L (ref 6–29)
AST: 19 U/L (ref 10–35)
Albumin: 3.7 g/dL (ref 3.6–5.1)
Alkaline Phosphatase: 50 U/L (ref 33–130)
BUN: 13 mg/dL (ref 7–25)
CALCIUM: 8.9 mg/dL (ref 8.6–10.4)
CHLORIDE: 106 mmol/L (ref 98–110)
CO2: 27 mmol/L (ref 20–31)
Creat: 0.6 mg/dL (ref 0.50–0.99)
GFR, Est Non African American: >89 mL/min (ref >=60)
Glucose, Bld: 108 mg/dL — ABNORMAL HIGH (ref 65–99)
POTASSIUM: 4.2 mmol/L (ref 3.5–5.3)
Sodium: 140 mmol/L (ref 135–146)
Total Bilirubin: 0.5 mg/dL (ref 0.2–1.2)
Total Protein: 5.9 g/dL — ABNORMAL LOW (ref 6.1–8.1)

## 2016-02-19 LAB — LIPID PANEL
CHOL/HDL RATIO: 2.6 ratio (ref <=5.0)
CHOLESTEROL: 180 mg/dL (ref 125–200)
HDL: 69 mg/dL (ref >=46)
LDL Cholesterol: 90 mg/dL (ref <130)
TRIGLYCERIDES: 106 mg/dL (ref <150)
VLDL: 21 mg/dL (ref <30)

## 2016-02-19 LAB — TSH: TSH: 1.06 mIU/L

## 2016-03-18 ENCOUNTER — Ambulatory Visit (INDEPENDENT_AMBULATORY_CARE_PROVIDER_SITE_OTHER): Payer: Medicare Other

## 2016-03-18 DIAGNOSIS — Z23 Encounter for immunization: Secondary | ICD-10-CM | POA: Diagnosis not present

## 2016-05-13 ENCOUNTER — Ambulatory Visit (INDEPENDENT_AMBULATORY_CARE_PROVIDER_SITE_OTHER): Payer: Medicare Other | Admitting: Family Medicine

## 2016-05-13 ENCOUNTER — Encounter: Payer: Self-pay | Admitting: Family Medicine

## 2016-05-13 VITALS — BP 124/78 | HR 81 | Temp 97.8°F | Resp 16 | Ht 64.0 in | Wt 120.0 lb

## 2016-05-13 DIAGNOSIS — J302 Other seasonal allergic rhinitis: Secondary | ICD-10-CM | POA: Diagnosis not present

## 2016-05-13 DIAGNOSIS — J4520 Mild intermittent asthma, uncomplicated: Secondary | ICD-10-CM

## 2016-05-13 DIAGNOSIS — J3089 Other allergic rhinitis: Secondary | ICD-10-CM | POA: Insufficient documentation

## 2016-05-13 MED ORDER — BUDESONIDE-FORMOTEROL FUMARATE 160-4.5 MCG/ACT IN AERO: 2.0000 | INHALATION_SPRAY | Freq: Two times a day (BID) | RESPIRATORY_TRACT | 12 refills | Status: DC

## 2016-05-13 MED ORDER — CETIRIZINE HCL 10 MG PO TABS: 10.0000 mg | ORAL_TABLET | Freq: Every day | ORAL | 11 refills | Status: DC

## 2016-05-13 NOTE — Progress Notes (Signed)
Name: Lindsey Golden   MRN: [id number]    DOB: 03-05-1946   Date:05/13/2016       Progress Note  Subjective  Chief Complaint  Chief Complaint  Patient presents with  . Asthma  . Allergies    HPI Here to discuss asthma.  She has had a little more allergic sx.  She is having to use rescue inhaler 2-3 times a day.  She has had no fever.  Cough not productive.  Rescue inhaler works well for [employer].  Some extra nasal congestion.  No problem-specific Assessment & Plan notes found for this encounter.   Past Medical History:  Diagnosis Date  . Asthma     History reviewed. No pertinent surgical history.  Family History  Problem Relation Age of Onset  . Depression Mother   . Hearing loss Mother   . Mental illness Mother   . Miscarriages / [place] Mother   . Asthma Father   . Kidney disease Father   . Stroke Father   . Varicose Veins Father     Social History   Social History  . Marital status: Married    Spouse name: N/A  . Number of children: N/A  . Years of education: N/A   Occupational History  . Not on file.   Social History Main Topics  . Smoking status: Current Some Day Smoker    Packs/day: 0.50    Types: Cigarettes  . Smokeless tobacco: Never Used  . Alcohol use No  . Drug use: No  . Sexual activity: No   Other Topics Concern  . Not on file   Social History Narrative  . No narrative on file     Current Outpatient Prescriptions:  .  acetaminophen (TYLENOL) 500 MG tablet, Take 500 mg by mouth every 6 (six) hours as needed., Disp: , Rfl:  .  albuterol (PROAIR HFA) 108 (90 Base) MCG/ACT inhaler, Inhale 2 puffs into the lungs every 6 (six) hours as needed for wheezing or shortness of breath. ProAir, Disp: 1 Inhaler, Rfl: 12 .  levothyroxine (SYNTHROID, LEVOTHROID) 75 MCG tablet, Take 1 tablet (75 mcg total) by mouth daily., Disp: 90 tablet, Rfl: 3 .  budesonide-formoterol (SYMBICORT) 160-4.5 MCG/ACT inhaler, Inhale 2 puffs into the lungs 2  (two) times daily., Disp: 1 Inhaler, Rfl: 12 .  cetirizine (ZYRTEC) 10 MG tablet, Take 1 tablet (10 mg total) by mouth daily., Disp: 30 tablet, Rfl: 11  Allergies  Allergen Reactions  . Aspirin Hives  . Penicillins Other (See Comments)     Review of Systems  Constitutional: Negative for chills, fever, malaise/fatigue and weight loss.  HENT: Negative for hearing loss and tinnitus.   Eyes: Negative for blurred vision and double vision.  Respiratory: Positive for cough (mild), shortness of breath (mild) and wheezing. Negative for sputum production.   Cardiovascular: Negative for chest pain, palpitations and leg swelling.  Gastrointestinal: Negative for abdominal pain, blood in stool and heartburn.  Genitourinary: Negative for dysuria, frequency and urgency.  Skin: Negative for rash.  Neurological: Negative for dizziness, tingling, tremors, weakness and headaches.      Objective  Vitals:   05/13/16 0810  BP: 124/78  Pulse: 81  Resp: 16  Temp: 97.8 F (36.6 C)  TempSrc: Oral  Weight: 120 lb (54.4 kg)  Height: 5' 4"  (1.626 m)    Physical Exam  Constitutional: She is oriented to person, place, and time and well-developed, well-nourished, and in no distress. No distress.  HENT:  Head: Normocephalic and  atraumatic.  Eyes: Conjunctivae and EOM are normal. Pupils are equal, round, and reactive to light. No scleral icterus.  Neck: Normal range of motion. Neck supple. Carotid bruit is not present. No thyromegaly present.  Cardiovascular: Normal rate, regular rhythm and normal heart sounds.  Exam reveals no gallop and no friction rub.   No murmur heard. Pulmonary/Chest: Effort normal and breath sounds normal. No respiratory distress. She has no wheezes. She has no rales.  Abdominal: Soft. Bowel sounds are normal. She exhibits no distension and no mass. There is no tenderness.  Musculoskeletal: She exhibits no edema.  Lymphadenopathy:    She has no cervical adenopathy.   Neurological: She is alert and oriented to person, place, and time.  Vitals reviewed.      Recent Results (from the past 2160 hour(s))  CBC with Differential     Status: None   Collection Time: 02/18/16  8:26 AM  Result Value Ref Range   WBC 9.6 3.8 - 10.8 K/uL   RBC 4.31 3.80 - 5.10 MIL/uL   Hemoglobin 13.7 11.7 - 15.5 g/dL   HCT 40.7 35.0 - 45.0 %   MCV 94.4 80.0 - 100.0 fL   MCH 31.8 27.0 - 33.0 pg   MCHC 33.7 32.0 - 36.0 g/dL   RDW 12.3 11.0 - 15.0 %   Platelets 264 140 - 400 K/uL   MPV 10.1 7.5 - 12.5 fL   Neutro Abs 5,856 1,500 - 7,800 cells/uL   Lymphs Abs 2,880 850 - 3,900 cells/uL   Monocytes Absolute 672 200 - 950 cells/uL   Eosinophils Absolute 192 15 - 500 cells/uL   Basophils Absolute 0 0 - 200 cells/uL   Neutrophils Relative % 61 %   Lymphocytes Relative 30 %   Monocytes Relative 7 %   Eosinophils Relative 2 %   Basophils Relative 0 %   Smear Review Criteria for review not met   COMPLETE METABOLIC PANEL WITH GFR     Status: Abnormal   Collection Time: 02/18/16  8:26 AM  Result Value Ref Range   Sodium 140 135 - 146 mmol/L   Potassium 4.2 3.5 - 5.3 mmol/L   Chloride 106 98 - 110 mmol/L   CO2 27 20 - 31 mmol/L   Glucose, Bld 108 (H) 65 - 99 mg/dL   BUN 13 7 - 25 mg/dL   Creat 0.60 0.50 - 0.99 mg/dL    Comment:   For patients > or = 68 years of age: The upper reference limit for Creatinine is approximately 13% higher for people identified as African-American.      Total Bilirubin 0.5 0.2 - 1.2 mg/dL   Alkaline Phosphatase 50 33 - 130 U/L   AST 19 10 - 35 U/L   ALT 10 6 - 29 U/L   Total Protein 5.9 (L) 6.1 - 8.1 g/dL   Albumin 3.7 3.6 - 5.1 g/dL   Calcium 8.9 8.6 - 10.4 mg/dL   GFR, Est African American >89 >=60 mL/min   GFR, Est Non African American >89 >=60 mL/min  Lipid Profile     Status: None   Collection Time: 02/18/16  8:26 AM  Result Value Ref Range   Cholesterol 180 125 - 200 mg/dL   Triglycerides 106 <150 mg/dL   HDL 69 >=46 mg/dL    Total CHOL/HDL Ratio 2.6 <=5.0 Ratio   VLDL 21 <30 mg/dL   LDL Cholesterol 90 <130 mg/dL    Comment:   Total Cholesterol/HDL Ratio:CHD Risk  Coronary Heart Disease Risk Table                                        Men       Women          1/2 Average Risk              3.4        3.3              Average Risk              5.0        4.4           2X Average Risk              9.6        7.1           3X Average Risk             23.4       11.0 Use the calculated Patient Ratio above and the CHD Risk table  to determine the patient's CHD Risk.   TSH     Status: None   Collection Time: 02/18/16  8:26 AM  Result Value Ref Range   TSH 1.06 mIU/L    Comment:   Reference Range   > or = 20 Years  0.40-4.50   Pregnancy Range First trimester  0.26-2.66 Second trimester 0.55-2.73 Third trimester  0.43-2.91        Assessment & Plan  Problem List Items Addressed This Visit      Respiratory   Asthma - Primary   Relevant Medications   budesonide-formoterol (SYMBICORT) 160-4.5 MCG/ACT inhaler   Seasonal allergies   Relevant Medications   cetirizine (ZYRTEC) 10 MG tablet      Meds ordered this encounter  Medications  . acetaminophen (TYLENOL) 500 MG tablet    Sig: Take 500 mg by mouth every 6 (six) hours as needed.  . budesonide-formoterol (SYMBICORT) 160-4.5 MCG/ACT inhaler    Sig: Inhale 2 puffs into the lungs 2 (two) times daily.    Dispense:  1 Inhaler    Refill:  12  . cetirizine (ZYRTEC) 10 MG tablet    Sig: Take 1 tablet (10 mg total) by mouth daily.    Dispense:  30 tablet    Refill:  11   1. Mild intermittent asthma, unspecified whether complicated  - budesonide-formoterol (SYMBICORT) 160-4.5 MCG/ACT inhaler; Inhale 2 puffs into the lungs 2 (two) times daily.  Dispense: 1 Inhaler; Refill: 12  2. Chronic seasonal allergic rhinitis, unspecified trigger  - cetirizine (ZYRTEC) 10 MG tablet; Take 1 tablet (10 mg total) by mouth daily.  Dispense:  30 tablet; Refill: 11

## 2016-09-02 ENCOUNTER — Encounter: Payer: Self-pay | Admitting: Family Medicine

## 2016-09-02 ENCOUNTER — Ambulatory Visit (INDEPENDENT_AMBULATORY_CARE_PROVIDER_SITE_OTHER): Payer: Medicare Other | Admitting: Family Medicine

## 2016-09-02 VITALS — BP 115/64 | HR 70 | Temp 98.0°F | Resp 16 | Ht 64.0 in | Wt 122.0 lb

## 2016-09-02 DIAGNOSIS — J302 Other seasonal allergic rhinitis: Secondary | ICD-10-CM

## 2016-09-02 DIAGNOSIS — J4520 Mild intermittent asthma, uncomplicated: Secondary | ICD-10-CM

## 2016-09-02 DIAGNOSIS — E034 Atrophy of thyroid (acquired): Secondary | ICD-10-CM

## 2016-09-02 DIAGNOSIS — M1990 Unspecified osteoarthritis, unspecified site: Secondary | ICD-10-CM | POA: Diagnosis not present

## 2016-09-02 DIAGNOSIS — R739 Hyperglycemia, unspecified: Secondary | ICD-10-CM

## 2016-09-02 DIAGNOSIS — S99921A Unspecified injury of right foot, initial encounter: Secondary | ICD-10-CM

## 2016-09-02 NOTE — Progress Notes (Signed)
Name: Lindsey Golden   MRN: [id number]    DOB: 1945/06/15   Date:09/02/2016       Progress Note  Subjective  Chief Complaint  Chief Complaint  Patient presents with  . Asthma  . Foot Pain  . Thyroid Problem    HPI Here for f/u of Hypothyroidism.  Also with asthma that is well controlled.  Injured her R toes/foot about 1 week ago.  His 1st an d 2nd toe against door stop.  Still bruised and sore.  No problem-specific Assessment & Plan notes found for this encounter.   Past Medical History:  Diagnosis Date  . Asthma     History reviewed. No pertinent surgical history.  Family History  Problem Relation Age of Onset  . Depression Mother   . Hearing loss Mother   . Mental illness Mother   . Miscarriages / [place] Mother   . Asthma Father   . Kidney disease Father   . Stroke Father   . Varicose Veins Father     Social History   Social History  . Marital status: Married    Spouse name: N/A  . Number of children: N/A  . Years of education: N/A   Occupational History  . Not on file.   Social History Main Topics  . Smoking status: Current Some Day Smoker    Packs/day: 0.50    Types: Cigarettes  . Smokeless tobacco: Never Used  . Alcohol use No  . Drug use: No  . Sexual activity: No   Other Topics Concern  . Not on file   Social History Narrative  . No narrative on file     Current Outpatient Prescriptions:  .  acetaminophen (TYLENOL) 500 MG tablet, Take 500 mg by mouth every 6 (six) hours as needed., Disp: , Rfl:  .  albuterol (PROAIR HFA) 108 (90 Base) MCG/ACT inhaler, Inhale 2 puffs into the lungs every 6 (six) hours as needed for wheezing or shortness of breath. ProAir, Disp: 1 Inhaler, Rfl: 12 .  budesonide-formoterol (SYMBICORT) 160-4.5 MCG/ACT inhaler, Inhale 2 puffs into the lungs 2 (two) times daily., Disp: 1 Inhaler, Rfl: 12 .  cetirizine (ZYRTEC) 10 MG tablet, Take 1 tablet (10 mg total) by mouth daily., Disp: 30 tablet, Rfl: 11 .   levothyroxine (SYNTHROID, LEVOTHROID) 75 MCG tablet, Take 1 tablet (75 mcg total) by mouth daily., Disp: 90 tablet, Rfl: 3  Allergies  Allergen Reactions  . Aspirin Hives  . Penicillins Other (See Comments)     Review of Systems  Constitutional: Negative for chills, fever, malaise/fatigue and weight loss.  HENT: Negative for hearing loss and tinnitus.   Eyes: Negative for blurred vision and double vision.  Respiratory: Negative for cough, shortness of breath and wheezing.   Cardiovascular: Negative for chest pain, palpitations and PND.  Gastrointestinal: Negative for abdominal pain, blood in stool and heartburn.  Genitourinary: Negative for dysuria, frequency and urgency.  Musculoskeletal: Positive for joint pain (R toes). Negative for myalgias.  Skin: Negative for rash.  Neurological: Negative for dizziness, tingling, tremors, weakness and headaches.      Objective  Vitals:   09/02/16 0847  BP: 115/64  Pulse: 70  Resp: 16  Temp: 98 F (36.7 C)  TempSrc: Oral  Weight: 122 lb (55.3 kg)  Height: 5\' 4"  (1.626 m)    Physical Exam  Constitutional: She is well-developed, well-nourished, and in no distress. No distress.  HENT:  Head: Normocephalic and atraumatic.  Eyes: Conjunctivae and EOM are normal.  Pupils are equal, round, and reactive to light. No scleral icterus.  Neck: Normal range of motion. Neck supple. Carotid bruit is not present. No thyromegaly present.  Cardiovascular: Normal rate and regular rhythm.  Exam reveals no gallop and no friction rub.   No murmur heard. Pulmonary/Chest: Effort normal and breath sounds normal. No respiratory distress. She has no wheezes. She has no rales.  Musculoskeletal: She exhibits no edema.  R 1st and 2nd toes swollen with bruising at bases.  Some tenderness at base of 2nd toe.  No abnormal bone palpation.  Lymphadenopathy:    She has no cervical adenopathy.  Neurological: She is alert.  Vitals reviewed.      No results  found for this or any previous visit (from the past 2160 hour(s)).   Assessment & Plan  Problem List Items Addressed This Visit      Respiratory   Asthma   Seasonal allergies     Endocrine   Hypothyroidism - Primary   Relevant Orders   COMPLETE METABOLIC PANEL WITH GFR   CBC with Differential   Lipid Profile   TSH     Musculoskeletal and Integument   Arthritis    Other Visit Diagnoses    Injury of right foot, initial encounter       Elevated blood sugar       Relevant Orders   HgB A1c      No orders of the defined types were placed in this encounter.  1. Hypothyroidism due to acquired atrophy of thyroid  - COMPLETE METABOLIC PANEL WITH GFR - CBC with Differential - Lipid Profile - TSH Cont Levothyroxine 2. Mild intermittent asthma, unspecified whether complicated Cont inhalers  3. Chronic seasonal allergic rhinitis, unspecified trigger   4. Arthritis   5. Injury of right foot, initial encounter Rest foot.  Use orthotic shoe.  Buddy tape  6. Elevated blood sugar  - HgB A1c

## 2016-09-03 ENCOUNTER — Other Ambulatory Visit: Payer: Medicare Other

## 2016-09-03 DIAGNOSIS — R739 Hyperglycemia, unspecified: Secondary | ICD-10-CM | POA: Diagnosis not present

## 2016-09-03 DIAGNOSIS — E034 Atrophy of thyroid (acquired): Secondary | ICD-10-CM | POA: Diagnosis not present

## 2016-09-03 LAB — CBC WITH DIFFERENTIAL/PLATELET
BASOS ABS: 94 {cells}/uL (ref 0–200)
Basophils Relative: 1 %
Eosinophils Absolute: 94 cells/uL (ref 15–500)
Eosinophils Relative: 1 %
HEMATOCRIT: 43 % (ref 35.0–45.0)
HEMOGLOBIN: 14.1 g/dL (ref 11.7–15.5)
LYMPHS ABS: 2914 {cells}/uL (ref 850–3900)
LYMPHS PCT: 31 %
MCH: 31.6 pg (ref 27.0–33.0)
MCHC: 32.8 g/dL (ref 32.0–36.0)
MCV: 96.4 fL (ref 80.0–100.0)
MONO ABS: 564 {cells}/uL (ref 200–950)
MPV: 10.1 fL (ref 7.5–12.5)
Monocytes Relative: 6 %
NEUTROS PCT: 61 %
Neutro Abs: 5734 cells/uL (ref 1500–7800)
Platelets: 279 10*3/uL (ref 140–400)
RBC: 4.46 MIL/uL (ref 3.80–5.10)
RDW: 12.8 % (ref 11.0–15.0)
WBC: 9.4 10*3/uL (ref 3.8–10.8)

## 2016-09-04 ENCOUNTER — Encounter: Payer: Self-pay | Admitting: *Deleted

## 2016-09-04 LAB — COMPLETE METABOLIC PANEL WITH GFR
ALBUMIN: 3.7 g/dL (ref 3.6–5.1)
ALT: 12 U/L (ref 6–29)
AST: 24 U/L (ref 10–35)
Alkaline Phosphatase: 54 U/L (ref 33–130)
BUN: 14 mg/dL (ref 7–25)
CALCIUM: 9 mg/dL (ref 8.6–10.4)
CHLORIDE: 106 mmol/L (ref 98–110)
CO2: 23 mmol/L (ref 20–31)
CREATININE: 0.65 mg/dL (ref 0.50–0.99)
GFR, Est African American: >89 mL/min (ref >=60)
GLUCOSE: 100 mg/dL — AB (ref 65–99)
POTASSIUM: 4.4 mmol/L (ref 3.5–5.3)
Sodium: 141 mmol/L (ref 135–146)
TOTAL PROTEIN: 6.4 g/dL (ref 6.1–8.1)
Total Bilirubin: 0.5 mg/dL (ref 0.2–1.2)

## 2016-09-04 LAB — HEMOGLOBIN A1C
HEMOGLOBIN A1C: 5 % (ref <5.7)
MEAN PLASMA GLUCOSE: 97 mg/dL

## 2016-09-04 LAB — LIPID PANEL
Cholesterol: 186 mg/dL (ref <200)
HDL: 70 mg/dL (ref >50)
LDL CALC: 89 mg/dL (ref <100)
Total CHOL/HDL Ratio: 2.7 Ratio (ref <5.0)
Triglycerides: 133 mg/dL (ref <150)
VLDL: 27 mg/dL (ref <30)

## 2016-09-04 LAB — TSH: TSH: 1.07 mIU/L

## 2016-12-19 DIAGNOSIS — H25013 Cortical age-related cataract, bilateral: Secondary | ICD-10-CM | POA: Diagnosis not present

## 2017-02-16 ENCOUNTER — Other Ambulatory Visit: Payer: Self-pay

## 2017-02-16 ENCOUNTER — Telehealth: Payer: Self-pay

## 2017-02-16 DIAGNOSIS — E034 Atrophy of thyroid (acquired): Secondary | ICD-10-CM

## 2017-02-16 MED ORDER — LEVOTHYROXINE SODIUM 75 MCG PO TABS: 75.0000 ug | ORAL_TABLET | Freq: Every day | ORAL | 0 refills | Status: DC

## 2017-02-16 NOTE — Telephone Encounter (Signed)
Patient called requesting a refill of her levothyroxine.  I told patient she might need an appointment.  However, due to her weather she would like a refill now and make appointment later.

## 2017-03-06 DIAGNOSIS — Z23 Encounter for immunization: Secondary | ICD-10-CM | POA: Diagnosis not present

## 2017-03-20 ENCOUNTER — Encounter: Payer: Self-pay | Admitting: Internal Medicine

## 2017-03-20 ENCOUNTER — Ambulatory Visit (INDEPENDENT_AMBULATORY_CARE_PROVIDER_SITE_OTHER): Payer: Medicare Other | Admitting: Internal Medicine

## 2017-03-20 VITALS — BP 120/70 | HR 82 | Ht 64.0 in | Wt 122.0 lb

## 2017-03-20 DIAGNOSIS — J302 Other seasonal allergic rhinitis: Secondary | ICD-10-CM | POA: Diagnosis not present

## 2017-03-20 DIAGNOSIS — K5520 Angiodysplasia of colon without hemorrhage: Secondary | ICD-10-CM | POA: Insufficient documentation

## 2017-03-20 DIAGNOSIS — K558 Other vascular disorders of intestine: Secondary | ICD-10-CM

## 2017-03-20 DIAGNOSIS — E034 Atrophy of thyroid (acquired): Secondary | ICD-10-CM | POA: Diagnosis not present

## 2017-03-20 DIAGNOSIS — J4520 Mild intermittent asthma, uncomplicated: Secondary | ICD-10-CM | POA: Diagnosis not present

## 2017-03-20 DIAGNOSIS — M172 Bilateral post-traumatic osteoarthritis of knee: Secondary | ICD-10-CM | POA: Diagnosis not present

## 2017-03-20 MED ORDER — LEVOTHYROXINE SODIUM 75 MCG PO TABS: 75.0000 ug | ORAL_TABLET | Freq: Every day | ORAL | 5 refills | Status: DC

## 2017-03-20 NOTE — Progress Notes (Signed)
Date:  03/20/2017   Name:  Lindsey Golden   DOB:  11-17-1945   MRN:  [id number]   Chief Complaint: Establish Care (Was seeing Dr Luan Pulling at [facility]. ) and Hypothyroidism (Refill on thyroid medication. )  Asthma  She complains of chest tightness and wheezing. There is no shortness of breath. This is a recurrent problem. The problem occurs intermittently. Pertinent negatives include no chest pain, fever, headaches, sore throat or trouble swallowing. Her symptoms are aggravated by nothing. Her symptoms are alleviated by steroid inhaler and beta-agonist. She reports significant improvement on treatment. Risk factors for lung disease include smoking/tobacco exposure. Her past medical history is significant for asthma.  Thyroid Problem  Presents for follow-up visit. Patient reports no constipation, diarrhea, fatigue or palpitations. The symptoms have been stable.  Knee Pain   Injury mechanism: several injuries over the years. The pain is present in the left knee and right knee. The quality of the pain is described as aching. The pain is moderate. The pain has been fluctuating since onset. She has tried NSAIDs (arthroscopic surgeries) for the symptoms.  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  Bleeding stopped and she has been stable since.   Workup included colonoscopy and EGD and finally capsule endoscopy of SB.   Review of Systems  Constitutional: Negative for chills, fatigue and fever.  HENT: Negative for sinus pressure, sore throat and trouble swallowing.   Eyes: Negative for visual disturbance.  Respiratory: Positive for wheezing. Negative for chest tightness and shortness of breath.   Cardiovascular: Negative for chest pain and palpitations.  Gastrointestinal: Negative for abdominal pain, blood in stool, constipation and diarrhea.  Genitourinary: Negative for dysuria.  Musculoskeletal: Positive for arthralgias (both knees ).  Skin: Negative  for rash.  Allergic/Immunologic: Positive for environmental allergies.  Neurological: Negative for dizziness and headaches.  Hematological: Negative for adenopathy.  Psychiatric/Behavioral: Negative for sleep disturbance.    Patient Active Problem List   Diagnosis Date Noted  . Seasonal allergies 05/13/2016  . Arthritis 11/26/2015  . Hypothyroidism 11/28/2014  . Asthma 11/28/2014    Prior to Admission medications   Medication Sig Start Date End Date Taking? Authorizing Provider  albuterol (PROAIR HFA) 108 (90 Base) MCG/ACT inhaler Inhale 2 puffs into the lungs every 6 (six) hours as needed for wheezing or shortness of breath. ProAir 11/26/15  Yes Arlis Porta., MD  budesonide-formoterol [facility]) 160-4.5 MCG/ACT inhaler Inhale 2 puffs into the lungs 2 (two) times daily. 05/13/16  Yes Arlis Porta., MD  cetirizine (ZYRTEC) 10 MG tablet Take 1 tablet (10 mg total) by mouth daily. 05/13/16  Yes Arlis Porta., MD  ferrous sulfate 220 (44 Fe) MG/5ML solution Take 220 mg by mouth daily.   Yes [provider]  levothyroxine (SYNTHROID, LEVOTHROID) 75 MCG tablet Take 1 tablet (75 mcg total) by mouth daily. 02/16/17  Yes Mikey College, NP  Multiple Vitamins-Minerals (MULTIPLE VITAMINS/WOMENS PO) Take by mouth.   Yes [provider]    Allergies  Allergen Reactions  . Aspirin Hives  . Penicillins Other (See Comments)    Past Surgical History:  Procedure Laterality Date  . 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 COLONOSCOPY  2013   every 5 years  . KNEE SURGERY     five different surgery    Social History  Substance Use Topics  . Smoking status: Current  Some Day Smoker    Packs/day: 0.25    Types: Cigarettes  . Smokeless tobacco: Never Used     Comment: 4-5  cigerettes daily  . Alcohol use No     Medication list has been reviewed and updated.  PHQ 2/9 Scores 09/02/2016 05/13/2016 11/26/2015 11/28/2014  PHQ - 2 Score 0 0 0 0     Physical Exam  Constitutional: She is oriented to person, place, and time. She appears well-developed. No distress.  HENT:  Head: Normocephalic and atraumatic.  Neck: Normal range of motion. Neck supple. No thyromegaly present.  Cardiovascular: Normal rate, regular rhythm and normal heart sounds.   Pulmonary/Chest: Effort normal and breath sounds normal. No respiratory distress. She has no wheezes. She has no rales.  Musculoskeletal: She exhibits no edema.       Right knee: She exhibits decreased range of motion. She exhibits no swelling and no effusion.       Left knee: She exhibits decreased range of motion. She exhibits no swelling and no effusion.  Neurological: She is alert and oriented to person, place, and time.  Skin: Skin is warm and dry. No rash noted.  Psychiatric: She has a normal mood and affect. Her speech is normal and behavior is normal. Thought content normal.  Nursing note and vitals reviewed.   BP 120/70   Pulse 82   Ht 5\' 4"  (1.626 m)   Wt 122 lb (55.3 kg)   SpO2 97%   BMI 20.94 kg/m   Assessment and Plan: 1. Mild intermittent asthma, unspecified whether complicated Continue inhalers Needs to quit smoking - CBC with Differential/Platelet  2. Hypothyroidism due to acquired atrophy of thyroid Supplemented - check labs - levothyroxine (SYNTHROID, LEVOTHROID) 75 MCG tablet; Take 1 tablet (75 mcg total) by mouth daily.  Dispense: 30 tablet; Refill: 5 - TSH  3. Seasonal allergies controlled  4. Post-traumatic osteoarthritis of both knees Follow up with Ortho as needed  5. AVM (arteriovenous malformation) of small bowel, acquired (HCC) Stable, monitor CBC   Meds ordered this encounter  Medications  . levothyroxine (SYNTHROID, LEVOTHROID) 75 MCG tablet    Sig: Take 1 tablet (75 mcg total) by mouth daily.    Dispense:  30 tablet    Refill:  5    Partially dictated using Editor, commissioning. Any errors are unintentional.  Halina Maidens, MD [facility]  03/20/2017

## 2017-03-22 LAB — CBC WITH DIFFERENTIAL/PLATELET
Basophils Absolute: 0.1 10*3/uL (ref 0.0–0.2)
Basos: 1 %
EOS (ABSOLUTE): 0.1 10*3/uL (ref 0.0–0.4)
EOS: 1 %
HEMATOCRIT: 41.4 % (ref 34.0–46.6)
Hemoglobin: 14 g/dL (ref 11.1–15.9)
Immature Grans (Abs): 0 10*3/uL (ref 0.0–0.1)
Immature Granulocytes: 0 %
LYMPHS ABS: 3.3 10*3/uL — AB (ref 0.7–3.1)
Lymphs: 31 %
MCH: 32 pg (ref 26.6–33.0)
MCHC: 33.8 g/dL (ref 31.5–35.7)
MCV: 95 fL (ref 79–97)
MONOS ABS: 0.7 10*3/uL (ref 0.1–0.9)
Monocytes: 7 %
Neutrophils Absolute: 6.2 10*3/uL (ref 1.4–7.0)
Neutrophils: 60 %
Platelets: 275 10*3/uL (ref 150–379)
RBC: 4.38 x10E6/uL (ref 3.77–5.28)
RDW: 12.8 % (ref 12.3–15.4)
WBC: 10.4 10*3/uL (ref 3.4–10.8)

## 2017-03-22 LAB — TSH: TSH: 0.914 u[IU]/mL (ref 0.450–4.500)

## 2017-03-30 ENCOUNTER — Ambulatory Visit: Payer: Medicare Other | Admitting: Internal Medicine

## 2017-04-02 ENCOUNTER — Ambulatory Visit (INDEPENDENT_AMBULATORY_CARE_PROVIDER_SITE_OTHER): Payer: Medicare Other

## 2017-04-02 VITALS — BP 104/70 | HR 78 | Temp 98.1°F | Resp 16 | Ht 64.0 in | Wt 120.8 lb

## 2017-04-02 DIAGNOSIS — Z0000 Encounter for general adult medical examination without abnormal findings: Secondary | ICD-10-CM | POA: Diagnosis not present

## 2017-04-02 DIAGNOSIS — Z9189 Other specified personal risk factors, not elsewhere classified: Secondary | ICD-10-CM

## 2017-04-02 DIAGNOSIS — Z1159 Encounter for screening for other viral diseases: Secondary | ICD-10-CM | POA: Diagnosis not present

## 2017-04-02 NOTE — Patient Instructions (Signed)
Lindsey Golden , Thank you for taking time to come for your Medicare Wellness Visit. I appreciate your ongoing commitment to your health goals. Please review the following plan we discussed and let me know if I can assist you in the future.   Screening recommendations/referrals: Colonoscopy: Completed 04/10/07 Mammogram: Declined. Requesting to discuss with Dr. Ladonna Snide at next OV Bone Density: Completed 04/10/09 Recommended yearly ophthalmology/optometry visit for glaucoma screening and checkup Recommended yearly dental visit for hygiene and checkup  Vaccinations: Influenza vaccine: Completed 03/09/17 Pneumococcal vaccine: Completed series Tdap vaccine: Declined. Declined. Please call your insurance company to determine your out of pocket expense. Shingles vaccine: Declined. Declined. Please call your insurance company to determine your out of pocket expense.    Advanced directives: Please bring a copy of your POA (Power of Attorney) and/or Living Will to your next appointment.   Conditions/risks identified: Smoking cessation discussed; Fall risk prevention discussed  Next appointment: Scheduled to see Dr. Army Melia on 10/07/17 10:30am. Follow up in one year for your annual wellness exam.   Preventive Care 65 Years and Older, Female Preventive care refers to lifestyle choices and visits with your health care provider that can promote health and wellness. What does preventive care include?  A yearly physical exam. This is also called an annual well check.  Dental exams once or twice a year.  Routine eye exams. Ask your health care provider how often you should have your eyes checked.  Personal lifestyle choices, including:  Daily care of your teeth and gums.  Regular physical activity.  Eating a healthy diet.  Avoiding tobacco and drug use.  Limiting alcohol use.  Practicing safe sex.  Taking low-dose aspirin every day.  Taking vitamin and mineral supplements as recommended  by your health care provider. What happens during an annual well check? The services and screenings done by your health care provider during your annual well check will depend on your age, overall health, lifestyle risk factors, and family history of disease. Counseling  Your health care provider may ask you questions about your:  Alcohol use.  Tobacco use.  Drug use.  Emotional well-being.  Home and relationship well-being.  Sexual activity.  Eating habits.  History of falls.  Memory and ability to understand (cognition).  Work and work Statistician.  Reproductive health. Screening  You may have the following tests or measurements:  Height, weight, and BMI.  Blood pressure.  Lipid and cholesterol levels. These may be checked every 5 years, or more frequently if you are over 77 years old.  Skin check.  Lung cancer screening. You may have this screening every year starting at age 58 if you have a 30-pack-year history of smoking and currently smoke or have quit within the past 15 years.  Fecal occult blood test (FOBT) of the stool. You may have this test every year starting at age 40.  Flexible sigmoidoscopy or colonoscopy. You may have a sigmoidoscopy every 5 years or a colonoscopy every 10 years starting at age 41.  Hepatitis C blood test.  Hepatitis B blood test.  Sexually transmitted disease (STD) testing.  Diabetes screening. This is done by checking your blood sugar (glucose) after you have not eaten for a while (fasting). You may have this done every 1-3 years.  Bone density scan. This is done to screen for osteoporosis. You may have this done starting at age 56.  Mammogram. This may be done every 1-2 years. Talk to your health care provider about how often you  should have regular mammograms. Talk with your health care provider about your test results, treatment options, and if necessary, the need for more tests. Vaccines  Your health care provider may  recommend certain vaccines, such as:  Influenza vaccine. This is recommended every year.  Tetanus, diphtheria, and acellular pertussis (Tdap, Td) vaccine. You may need a Td booster every 10 years.  Zoster vaccine. You may need this after age 57.  Pneumococcal 13-valent conjugate (PCV13) vaccine. One dose is recommended after age 26.  Pneumococcal polysaccharide (PPSV23) vaccine. One dose is recommended after age 64. Talk to your health care provider about which screenings and vaccines you need and how often you need them. This information is not intended to replace advice given to you by your health care provider. Make sure you discuss any questions you have with your health care provider. Document Released: 06/22/2015 Document Revised: 02/13/2016 Document Reviewed: 03/27/2015 Elsevier Interactive Patient Education  2017 [place] Prevention in the Home Falls can cause injuries. They can happen to people of all ages. There are many things you can do to make your home safe and to help prevent falls. What can I do on the outside of my home?  Regularly fix the edges of walkways and driveways and fix any cracks.  Remove anything that might make you trip as you walk through a door, such as a raised step or threshold.  Trim any bushes or trees on the path to your home.  Use bright outdoor lighting.  Clear any walking paths of anything that might make someone trip, such as rocks or tools.  Regularly check to see if handrails are loose or broken. Make sure that both sides of any steps have handrails.  Any raised decks and porches should have guardrails on the edges.  Have any leaves, snow, or ice cleared regularly.  Use sand or salt on walking paths during winter.  Clean up any spills in your garage right away. This includes oil or grease spills. What can I do in the bathroom?  Use night lights.  Install grab bars by the toilet and in the tub and shower. Do not use towel  bars as grab bars.  Use non-skid mats or decals in the tub or shower.  If you need to sit down in the shower, use a plastic, non-slip stool.  Keep the floor dry. Clean up any water that spills on the floor as soon as it happens.  Remove soap buildup in the tub or shower regularly.  Attach bath mats securely with double-sided non-slip rug tape.  Do not have throw rugs and other things on the floor that can make you trip. What can I do in the bedroom?  Use night lights.  Make sure that you have a light by your bed that is easy to reach.  Do not use any sheets or blankets that are too big for your bed. They should not hang down onto the floor.  Have a firm chair that has side arms. You can use this for support while you get dressed.  Do not have throw rugs and other things on the floor that can make you trip. What can I do in the kitchen?  Clean up any spills right away.  Avoid walking on wet floors.  Keep items that you use a lot in easy-to-reach places.  If you need to reach something above you, use a strong step stool that has a grab bar.  Keep electrical cords out  of the way.  Do not use floor polish or wax that makes floors slippery. If you must use wax, use non-skid floor wax.  Do not have throw rugs and other things on the floor that can make you trip. What can I do with my stairs?  Do not leave any items on the stairs.  Make sure that there are handrails on both sides of the stairs and use them. Fix handrails that are broken or loose. Make sure that handrails are as long as the stairways.  Check any carpeting to make sure that it is firmly attached to the stairs. Fix any carpet that is loose or worn.  Avoid having throw rugs at the top or bottom of the stairs. If you do have throw rugs, attach them to the floor with carpet tape.  Make sure that you have a light switch at the top of the stairs and the bottom of the stairs. If you do not have them, ask someone to  add them for you. What else can I do to help prevent falls?  Wear shoes that:  Do not have high heels.  Have rubber bottoms.  Are comfortable and fit you well.  Are closed at the toe. Do not wear sandals.  If you use a stepladder:  Make sure that it is fully opened. Do not climb a closed stepladder.  Make sure that both sides of the stepladder are locked into place.  Ask someone to hold it for you, if possible.  Clearly mark and make sure that you can see:  Any grab bars or handrails.  First and last steps.  Where the edge of each step is.  Use tools that help you move around (mobility aids) if they are needed. These include:  Canes.  Walkers.  Scooters.  Crutches.  Turn on the lights when you go into a dark area. Replace any light bulbs as soon as they burn out.  Set up your furniture so you have a clear path. Avoid moving your furniture around.  If any of your floors are uneven, fix them.  If there are any pets around you, be aware of where they are.  Review your medicines with your doctor. Some medicines can make you feel dizzy. This can increase your chance of falling. Ask your doctor what other things that you can do to help prevent falls. This information is not intended to replace advice given to you by your health care provider. Make sure you discuss any questions you have with your health care provider. Document Released: 03/22/2009 Document Revised: 11/01/2015 Document Reviewed: 06/30/2014 Elsevier Interactive Patient Education  2017 [employer].

## 2017-04-02 NOTE — Progress Notes (Signed)
Subjective:   Lindsey Golden is a 71 y.o. female who presents for Medicare Annual (Subsequent) preventive examination.  Review of Systems:  N/A Cardiac Risk Factors include: advanced age (>36men, >74 women);smoking/ tobacco exposure     Objective:     Vitals: BP 104/70 (BP Location: Left Arm, Patient Position: Sitting, Cuff Size: Normal)   Pulse 78   Temp 98.1 F (36.7 C) (Oral)   Resp 16   Ht 5\' 4"  (1.626 m)   Wt 120 lb 12.8 oz (54.8 kg)   BMI 20.74 kg/m   Body mass index is 20.74 kg/m.   Tobacco History  Smoking Status  . Current Some Day Smoker  . Packs/day: 0.25  . Types: Cigarettes  Smokeless Tobacco  . Never Used    Comment: 4-5  cigerettes daily     Ready to quit: No Counseling given: No   Past Medical History:  Diagnosis Date  . Allergy   . Asthma    mild intermittent due to allergies.  . Osteoarthritis   . Thyroid disease    Past Surgical History:  Procedure Laterality Date  . 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 BREAST SURGERY    . COLONOSCOPY  2013   every 5 years  . KNEE SURGERY     five different surgery   Family History  Problem Relation Age of Onset  . Depression Mother   . Hearing loss Mother   . Mental illness Mother   . Miscarriages / [place] Mother   . Asthma Father   . Kidney disease Father   . Stroke Father   . Varicose Veins Father    History  Sexual Activity  . Sexual activity: No    Outpatient Encounter Prescriptions as of 04/02/2017  Medication Sig  . albuterol (PROAIR HFA) 108 (90 Base) MCG/ACT inhaler Inhale 2 puffs into the lungs every 6 (six) hours as needed for wheezing or shortness of breath. ProAir  . budesonide-formoterol (SYMBICORT) 160-4.5 MCG/ACT inhaler Inhale 2 puffs into the lungs 2 (two) times daily.  . cetirizine (ZYRTEC) 10 MG tablet Take 1 tablet (10 mg total) by mouth daily.  . ferrous sulfate 220 (44 Fe) MG/5ML solution Take 220 mg by mouth daily.  Marland Kitchen levothyroxine  (SYNTHROID, LEVOTHROID) 75 MCG tablet Take 1 tablet (75 mcg total) by mouth daily.  . Multiple Vitamins-Minerals (MULTIPLE VITAMINS/WOMENS PO) Take by mouth.   No facility-administered encounter medications on file as of 04/02/2017.     Activities of Daily Living In your present state of health, do you have any difficulty performing the following activities: 04/02/2017 09/02/2016  Hearing? N N  Vision? N N  Difficulty concentrating or making decisions? N N  Walking or climbing stairs? N N  Dressing or bathing? N N  Doing errands, shopping? N N  Preparing Food and eating ? N -  Using the Toilet? N -  In the past six months, have you accidently leaked urine? N -  Do you have problems with loss of bowel control? N -  Managing your Medications? N -  Managing your Finances? N -  Housekeeping or managing your Housekeeping? N -  Some recent data might be hidden    Patient Care Team: Glean Hess, MD as PCP - General (Internal Medicine)    Assessment:     Exercise Activities and Dietary recommendations Current Exercise Habits: Structured exercise class, Type of exercise: strength training/weights;stretching, Time (Minutes): 20, Frequency (Times/Week): 7, Weekly  Exercise (Minutes/Week): 140, Intensity: Mild, Exercise limited by: None identified  Goals    . Quit smoking / using tobacco          Recommend to attend smoking cessation classes or contact [email or web address]      Fall Risk Fall Risk  04/02/2017 09/02/2016 05/13/2016 11/26/2015 11/28/2014  Falls in the past year? No No No No No   Depression Screen PHQ 2/9 Scores 04/02/2017 09/02/2016 05/13/2016 11/26/2015  PHQ - 2 Score 0 0 0 0     Cognitive Function     6CIT Screen 04/02/2017  What Year? 0 points  What month? 0 points  What time? 0 points  Count back from 20 0 points  Months in reverse 2 points  Repeat phrase 0 points  Total Score 2    Immunization History  Administered Date(s) Administered  . Influenza, High  Dose Seasonal PF 04/05/2015, 03/18/2016   Screening Tests Health Maintenance  Topic Date Due  . Hepatitis C Screening  27-Sep-1945  . MAMMOGRAM  08/30/1965  . TETANUS/TDAP  08/31/1966  . COLONOSCOPY  04/09/2017  . INFLUENZA VACCINE  Completed  . DEXA SCAN  Completed  . PNA vac Low Risk Adult  Completed      Plan:    I have personally reviewed and addressed the Medicare Annual Wellness questionnaire and have noted the following in the patient's chart:  A. Medical and social history B. Use of alcohol, tobacco or illicit drugs  C. Current medications and supplements D. Functional ability and status E.  Nutritional status F.  Physical activity G. Advance directives H. List of other physicians I.  Hospitalizations, surgeries, and ER visits in previous 12 months J.  [place] such as hearing and vision if needed, cognitive and depression L. Referrals and appointments - none  In addition, I have reviewed and discussed with patient certain preventive protocols, quality metrics, and best practice recommendations. A written personalized care plan for preventive services as well as general preventive health recommendations were provided to patient.  See attached scanned questionnaire for additional information.   Signed,  Aleatha Borer, LPN Nurse Health Advisor  MD Recommendations: Due for mammogram. Pt declined orders today. States the exam is very painful and would like to discuss need for further testing at her next OV.  Declined Tetanus and Shingles vaccines. Will check with insurance or obtain through pharmacy or Health Dept.  Due for Hep C screening. Labs ordered today

## 2017-05-08 DIAGNOSIS — Z1159 Encounter for screening for other viral diseases: Secondary | ICD-10-CM | POA: Diagnosis not present

## 2017-05-09 LAB — HEPATITIS C ANTIBODY

## 2017-06-08 ENCOUNTER — Other Ambulatory Visit: Payer: Self-pay

## 2017-06-08 DIAGNOSIS — J4520 Mild intermittent asthma, uncomplicated: Secondary | ICD-10-CM

## 2017-06-08 MED ORDER — BUDESONIDE-FORMOTEROL FUMARATE 160-4.5 MCG/ACT IN AERO: 2.0000 | INHALATION_SPRAY | Freq: Two times a day (BID) | RESPIRATORY_TRACT | 12 refills | Status: DC

## 2017-07-06 ENCOUNTER — Other Ambulatory Visit: Payer: Self-pay

## 2017-07-06 DIAGNOSIS — J302 Other seasonal allergic rhinitis: Secondary | ICD-10-CM

## 2017-07-06 MED ORDER — CETIRIZINE HCL 10 MG PO TABS: 10.0000 mg | ORAL_TABLET | Freq: Every day | ORAL | 11 refills | Status: DC

## 2017-07-18 ENCOUNTER — Other Ambulatory Visit: Payer: Self-pay | Admitting: Internal Medicine

## 2017-07-18 DIAGNOSIS — E034 Atrophy of thyroid (acquired): Secondary | ICD-10-CM

## 2017-07-21 ENCOUNTER — Other Ambulatory Visit: Payer: Self-pay

## 2017-07-21 MED ORDER — VENTOLIN HFA 108 (90 BASE) MCG/ACT IN AERS: 2.0000 | INHALATION_SPRAY | Freq: Four times a day (QID) | RESPIRATORY_TRACT | 5 refills | Status: DC | PRN

## 2017-08-31 ENCOUNTER — Telehealth: Payer: Self-pay

## 2017-08-31 NOTE — Telephone Encounter (Signed)
Did patient PA through covermymeds on Symbicort inhaler. Awaiting outcome through fax.

## 2017-10-07 ENCOUNTER — Ambulatory Visit (INDEPENDENT_AMBULATORY_CARE_PROVIDER_SITE_OTHER): Payer: Medicare Other | Admitting: Internal Medicine

## 2017-10-07 ENCOUNTER — Encounter: Payer: Self-pay | Admitting: Internal Medicine

## 2017-10-07 VITALS — BP 112/72 | HR 86 | Resp 16 | Ht 64.0 in | Wt 119.0 lb

## 2017-10-07 DIAGNOSIS — K5520 Angiodysplasia of colon without hemorrhage: Secondary | ICD-10-CM

## 2017-10-07 DIAGNOSIS — J4520 Mild intermittent asthma, uncomplicated: Secondary | ICD-10-CM

## 2017-10-07 DIAGNOSIS — D126 Benign neoplasm of colon, unspecified: Secondary | ICD-10-CM

## 2017-10-07 DIAGNOSIS — E034 Atrophy of thyroid (acquired): Secondary | ICD-10-CM | POA: Diagnosis not present

## 2017-10-07 DIAGNOSIS — R350 Frequency of micturition: Secondary | ICD-10-CM | POA: Diagnosis not present

## 2017-10-07 DIAGNOSIS — Z1231 Encounter for screening mammogram for malignant neoplasm of breast: Secondary | ICD-10-CM

## 2017-10-07 DIAGNOSIS — Z1211 Encounter for screening for malignant neoplasm of colon: Secondary | ICD-10-CM | POA: Diagnosis not present

## 2017-10-07 DIAGNOSIS — Z720 Tobacco use: Secondary | ICD-10-CM

## 2017-10-07 LAB — POCT URINALYSIS DIPSTICK
Bilirubin, UA: NEGATIVE
Blood, UA: NEGATIVE
GLUCOSE UA: NEGATIVE
Ketones, UA: NEGATIVE
LEUKOCYTES UA: NEGATIVE
Nitrite, UA: NEGATIVE
Protein, UA: NEGATIVE
SPEC GRAV UA: 1.02 (ref 1.010–1.025)
Urobilinogen, UA: 0.2 E.U./dL
pH, UA: 6 (ref 5.0–8.0)

## 2017-10-07 NOTE — Progress Notes (Signed)
Date:  10/07/2017   Name:  Lindsey Golden   DOB:  Aug 13, 1945   MRN:  [id number]   Chief Complaint: Annual Exam Medicare yearly check up. No recent mammogram done - pt has declined them.  Last colonoscopy was possibly in 2013 with plan for 5 yr follow up? (from [facility] but no procedure note or path report found). It was done by Dr. Vira [facility]ar at [facility].  She has had pneumonia vaccines from Dr. Luan Pulling.  Asthma  There is no cough, shortness of breath or wheezing. Pertinent negatives include no chest pain, fever, headaches or trouble swallowing. Her symptoms are alleviated by rest and beta-agonist. She reports significant improvement on treatment. Risk factors for lung disease include smoking/tobacco exposure. Her past medical history is significant for asthma.  Thyroid Problem  Presents for follow-up visit. Patient reports no anxiety, constipation, diarrhea, fatigue, palpitations or tremors.  Nicotine Dependence  Presents for follow-up visit. Symptoms are negative for fatigue. Her urge triggers include company of smokers. The symptoms have been stable. She smokes < 1/2 a pack of cigarettes per day.   Lab Results  Component Value Date   TSH 0.914 03/20/2017   Lab Results  Component Value Date   CREATININE 0.65 09/02/2016   BUN 14 09/02/2016   NA 141 09/02/2016   K 4.4 09/02/2016   CL 106 09/02/2016   CO2 23 09/02/2016   Lab Results  Component Value Date   WBC 10.4 03/20/2017   HGB 14.0 03/20/2017   HCT 41.4 03/20/2017   MCV 95 03/20/2017   PLT 275 03/20/2017      Review of Systems  Constitutional: Negative for chills, fatigue and fever.  HENT: Negative for congestion, hearing loss, tinnitus, trouble swallowing and voice change.   Eyes: Negative for visual disturbance.  Respiratory: Negative for cough, chest tightness, shortness of breath and wheezing.   Cardiovascular: Negative for chest pain, palpitations and leg swelling.  Gastrointestinal: Negative for  abdominal pain, constipation, diarrhea and vomiting.  Endocrine: Negative for polydipsia and polyuria.  Genitourinary: Negative for dysuria, frequency, genital sores, vaginal bleeding and vaginal discharge.  Musculoskeletal: Positive for arthralgias (knee OA). Negative for gait problem and joint swelling.  Skin: Negative for color change and rash.  Allergic/Immunologic: Positive for environmental allergies.  Neurological: Negative for dizziness, tremors, light-headedness and headaches.  Hematological: Negative for adenopathy. Does not bruise/bleed easily.  Psychiatric/Behavioral: Negative for dysphoric mood and sleep disturbance. The patient is not nervous/anxious.     Patient Active Problem List   Diagnosis Date Noted  . 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 Seasonal allergies 05/13/2016  . Degenerative arthritis of knee, bilateral 11/26/2015  . Hypothyroidism 11/28/2014  . Asthma 11/28/2014    Prior to Admission medications   Medication Sig Start Date End Date Taking? Authorizing Provider  albuterol (PROAIR HFA) 108 (90 Base) MCG/ACT inhaler Inhale 2 puffs into the lungs every 6 (six) hours as needed for wheezing or shortness of breath. ProAir 11/26/15  Yes Arlis Porta., MD  budesonide-formoterol [facility]) 160-4.5 MCG/ACT inhaler Inhale 2 puffs into the lungs 2 (two) times daily. 06/08/17  Yes Glean Hess, MD  cetirizine (ZYRTEC) 10 MG tablet Take 1 tablet (10 mg total) by mouth daily. 07/06/17  Yes Glean Hess, MD  ferrous sulfate 220 (44 Fe) MG/5ML solution Take 220 mg by mouth daily.   Yes [provider]  levothyroxine (SYNTHROID, LEVOTHROID) 75 MCG tablet TAKE 1 TABLET BY MOUTH EVERY  DAY 07/19/17  Yes Glean Hess, MD  Multiple Vitamins-Minerals (MULTIPLE VITAMINS/WOMENS PO) Take by mouth.   Yes [provider]  VENTOLIN HFA 108 (90 Base) MCG/ACT inhaler Inhale 2 puffs into the lungs every 6 (six) hours as  needed for wheezing or shortness of breath. 07/21/17  Yes Glean Hess, MD    Allergies  Allergen Reactions  . Aspirin Hives  . Penicillins Other (See Comments)  . Dairy Aid [Lactase] Diarrhea    Past Surgical History:  Procedure Laterality Date  . 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 BREAST SURGERY    . COLONOSCOPY  2013   every 5 years  . KNEE SURGERY     five different surgery    Social History   Tobacco Use  . Smoking status: Current Some Day Smoker    Packs/day: 0.25    Types: Cigarettes  . Smokeless tobacco: Never Used  . Tobacco comment: 4-5  cigerettes daily  Substance Use Topics  . Alcohol use: No  . Drug use: No     Medication list has been reviewed and updated.  PHQ 2/9 Scores 10/07/2017 04/02/2017 09/02/2016 05/13/2016  PHQ - 2 Score 0 0 0 0    Physical Exam  Constitutional: She is oriented to person, place, and time. She appears well-developed and well-nourished. No distress.  HENT:  Head: Normocephalic and atraumatic.  Right Ear: Tympanic membrane and ear canal normal.  Left Ear: Tympanic membrane and ear canal normal.  Nose: Right sinus exhibits no maxillary sinus tenderness. Left sinus exhibits no maxillary sinus tenderness.  Mouth/Throat: Uvula is midline and oropharynx is clear and moist.  Eyes: Conjunctivae and EOM are normal. Right eye exhibits no discharge. Left eye exhibits no discharge. No scleral icterus.  Neck: Normal range of motion. Carotid bruit is not present. No erythema present. No thyromegaly present.  Cardiovascular: Normal rate, regular rhythm, normal heart sounds and normal pulses.  Pulmonary/Chest: Effort normal. No respiratory distress. She has no wheezes. Right breast exhibits no mass, no nipple discharge, no skin change and no tenderness. Left breast exhibits no mass, no nipple discharge, no skin change and no tenderness.  Abdominal: Soft. Bowel sounds are normal. There is no hepatosplenomegaly. There is no  tenderness. There is no CVA tenderness.  Musculoskeletal: Normal range of motion.  Lymphadenopathy:    She has no cervical adenopathy.    She has no axillary adenopathy.  Neurological: She is alert and oriented to person, place, and time. She has normal reflexes. No cranial nerve deficit or sensory deficit.  Skin: Skin is warm, dry and intact. No rash noted.  Psychiatric: She has a normal mood and affect. Her speech is normal and behavior is normal. Thought content normal.  Nursing note and vitals reviewed.   BP 112/72   Pulse 86   Resp 16   Ht 5\' 4"  (1.626 m)   Wt 119 lb (54 kg)   SpO2 97%   BMI 20.43 kg/m   Assessment and Plan: 1. Mild intermittent asthma, unspecified whether complicated Stable Encourage pt to attempt to quit smoking - Comprehensive metabolic panel  2. 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 Tobacco use - Nurse to provide smoking / tobacco cessation education  4. Encounter for screening mammogram for breast cancer Pt declines mammograms Educated on self breast awarement  5. AVM (arteriovenous malformation) of small bowel, acquired - CBC with Differential/Platelet  6. Colon  cancer screening Due for 5 yr colonoscopy  7. Urinary frequency - POCT urinalysis dipstick   No orders of the defined types were placed in this encounter.   Partially dictated using Editor, commissioning. Any errors are unintentional.  Halina Maidens, MD [facility]  10/07/2017

## 2017-10-07 NOTE — Patient Instructions (Addendum)
Imodium 2 mg tablets - over the counter to take as needed for diarrha  Breast Self-Awareness Breast self-awareness means being familiar with how your breasts look and feel. It involves checking your breasts regularly and reporting any changes to your health care provider. Practicing breast self-awareness is important. A change in your breasts can be a sign of a serious medical problem. Being familiar with how your breasts look and feel allows you to find any problems early, when treatment is more likely to be successful. All women should practice breast self-awareness, including women who have had breast implants. How to do a breast self-exam One way to learn what is normal for your breasts and whether your breasts are changing is to do a breast self-exam. To do a breast self-exam: Look for Changes  1. Remove all the clothing above your waist. 2. Stand in front of a mirror in a room with good lighting. 3. Put your hands on your hips. 4. Push your hands firmly downward. 5. Compare your breasts in the mirror. Look for differences between them (asymmetry), such as: ? Differences in shape. ? Differences in size. ? Puckers, dips, and bumps in one breast and not the other. 6. Look at each breast for changes in your skin, such as: ? Redness. ? Scaly areas. 7. Look for changes in your nipples, such as: ? Discharge. ? Bleeding. ? Dimpling. ? Redness. ? A change in position. Feel for Changes  Carefully feel your breasts for lumps and changes. It is best to do this while lying on your back on the floor and again while sitting or standing in the shower or tub with soapy water on your skin. Feel each breast in the following way:  Place the arm on the side of the breast you are examining above your head.  Feel your breast with the other hand.  Start in the nipple area and make  inch (2 cm) overlapping circles to feel your breast. Use the pads of your three middle fingers to do this. Apply light  pressure, then medium pressure, then firm pressure. The light pressure will allow you to feel the tissue closest to the skin. The medium pressure will allow you to feel the tissue that is a little deeper. The firm pressure will allow you to feel the tissue close to the ribs.  Continue the overlapping circles, moving downward over the breast until you feel your ribs below your breast.  Move one finger-width toward the center of the body. Continue to use the  inch (2 cm) overlapping circles to feel your breast as you move slowly up toward your collarbone.  Continue the up and down exam using all three pressures until you reach your armpit.  Write Down What You Find  Write down what is normal for each breast and any changes that you find. Keep a written record with breast changes or normal findings for each breast. By writing this information down, you do not need to depend only on memory for size, tenderness, or location. Write down where you are in your menstrual cycle, if you are still menstruating. If you are having trouble noticing differences in your breasts, do not get discouraged. With time you will become more familiar with the variations in your breasts and more comfortable with the exam. How often should I examine my breasts? Examine your breasts every month. If you are breastfeeding, the best time to examine your breasts is after a feeding or after using a breast pump.  If you menstruate, the best time to examine your breasts is 5-7 days after your period is over. During your period, your breasts are lumpier, and it may be more difficult to notice changes. When should I see my health care provider? See your health care provider if you notice:  A change in shape or size of your breasts or nipples.  A change in the skin of your breast or nipples, such as a reddened or scaly area.  Unusual discharge from your nipples.  A lump or thick area that was not there before.  Pain in your  breasts.  Anything that concerns you.  This information is not intended to replace advice given to you by your health care provider. Make sure you discuss any questions you have with your health care provider. Document Released: 05/26/2005 Document Revised: 11/01/2015 Document Reviewed: 04/15/2015 Elsevier Interactive Patient Education  [employer].

## 2017-10-08 LAB — COMPREHENSIVE METABOLIC PANEL
ALT: 11 IU/L (ref 0–32)
AST: 24 IU/L (ref 0–40)
Albumin/Globulin Ratio: 1.8 (ref 1.2–2.2)
Albumin: 4.4 g/dL (ref 3.5–4.8)
Alkaline Phosphatase: 68 IU/L (ref 39–117)
BUN/Creatinine Ratio: 20 (ref 12–28)
BUN: 14 mg/dL (ref 8–27)
Bilirubin Total: 0.4 mg/dL (ref 0.0–1.2)
CALCIUM: 9.6 mg/dL (ref 8.7–10.3)
CO2: 25 mmol/L (ref 20–29)
Chloride: 104 mmol/L (ref 96–106)
Creatinine, Ser: 0.71 mg/dL (ref 0.57–1.00)
GFR, EST AFRICAN AMERICAN: 100 mL/min/{1.73_m2} (ref >59)
GFR, EST NON AFRICAN AMERICAN: 87 mL/min/{1.73_m2} (ref >59)
GLOBULIN, TOTAL: 2.5 g/dL (ref 1.5–4.5)
Glucose: 83 mg/dL (ref 65–99)
Potassium: 4.3 mmol/L (ref 3.5–5.2)
SODIUM: 144 mmol/L (ref 134–144)
TOTAL PROTEIN: 6.9 g/dL (ref 6.0–8.5)

## 2017-10-08 LAB — CBC WITH DIFFERENTIAL/PLATELET
BASOS: 0 %
Basophils Absolute: 0 10*3/uL (ref 0.0–0.2)
EOS (ABSOLUTE): 0 10*3/uL (ref 0.0–0.4)
EOS: 0 %
HEMATOCRIT: 44.6 % (ref 34.0–46.6)
Hemoglobin: 14.8 g/dL (ref 11.1–15.9)
IMMATURE GRANS (ABS): 0 10*3/uL (ref 0.0–0.1)
Immature Granulocytes: 0 %
LYMPHS: 27 %
Lymphocytes Absolute: 2.8 10*3/uL (ref 0.7–3.1)
MCH: 32.3 pg (ref 26.6–33.0)
MCHC: 33.2 g/dL (ref 31.5–35.7)
MCV: 97 fL (ref 79–97)
MONOCYTES: 5 %
Monocytes Absolute: 0.5 10*3/uL (ref 0.1–0.9)
Neutrophils Absolute: 6.7 10*3/uL (ref 1.4–7.0)
Neutrophils: 68 %
Platelets: 289 10*3/uL (ref 150–379)
RBC: 4.58 x10E6/uL (ref 3.77–5.28)
RDW: 12.3 % (ref 12.3–15.4)
WBC: 10.1 10*3/uL (ref 3.4–10.8)

## 2017-10-08 LAB — LIPID PANEL
CHOL/HDL RATIO: 2.2 ratio (ref 0.0–4.4)
Cholesterol, Total: 197 mg/dL (ref 100–199)
HDL: 89 mg/dL (ref >39)
LDL CALC: 87 mg/dL (ref 0–99)
TRIGLYCERIDES: 105 mg/dL (ref 0–149)
VLDL Cholesterol Cal: 21 mg/dL (ref 5–40)

## 2017-10-08 LAB — TSH: TSH: 0.457 u[IU]/mL (ref 0.450–4.500)

## 2017-10-28 ENCOUNTER — Telehealth: Payer: Self-pay

## 2017-10-28 NOTE — Telephone Encounter (Signed)
Received fax from patient insurance stating PA is not required for Symbicort inhaler. Its on the list for covered drugs. Placed fax in scan pile to be scanned into patients chart.

## 2017-10-28 NOTE — Telephone Encounter (Signed)
Prior Authorization done for patient medication: Symbicort 160-4.5MCG/ACT Aerosol. Sent to plan. Response for outcome will be given within 72 hours via FAX. Awaiting fax for outcome for patient.

## 2017-11-27 DIAGNOSIS — Z86010 Personal history of colonic polyps: Secondary | ICD-10-CM | POA: Diagnosis not present

## 2018-01-10 ENCOUNTER — Other Ambulatory Visit: Payer: Self-pay | Admitting: Internal Medicine

## 2018-01-11 ENCOUNTER — Telehealth: Payer: Self-pay | Admitting: Internal Medicine

## 2018-01-11 NOTE — Telephone Encounter (Signed)
Needs pharmacy changed to [facility] in [place]

## 2018-01-19 DIAGNOSIS — D124 Benign neoplasm of descending colon: Secondary | ICD-10-CM | POA: Diagnosis not present

## 2018-01-19 DIAGNOSIS — K5730 Diverticulosis of large intestine without perforation or abscess without bleeding: Secondary | ICD-10-CM | POA: Diagnosis not present

## 2018-01-19 DIAGNOSIS — D123 Benign neoplasm of transverse colon: Secondary | ICD-10-CM | POA: Diagnosis not present

## 2018-01-19 DIAGNOSIS — Z86010 Personal history of colonic polyps: Secondary | ICD-10-CM | POA: Diagnosis not present

## 2018-01-19 DIAGNOSIS — K635 Polyp of colon: Secondary | ICD-10-CM | POA: Diagnosis not present

## 2018-01-19 DIAGNOSIS — D126 Benign neoplasm of colon, unspecified: Secondary | ICD-10-CM | POA: Diagnosis not present

## 2018-01-19 LAB — HM COLONOSCOPY

## 2018-02-02 ENCOUNTER — Encounter: Payer: Self-pay | Admitting: Internal Medicine

## 2018-02-02 ENCOUNTER — Ambulatory Visit (INDEPENDENT_AMBULATORY_CARE_PROVIDER_SITE_OTHER): Payer: Medicare Other | Admitting: Internal Medicine

## 2018-02-02 VITALS — BP 112/70 | HR 71 | Ht 64.0 in | Wt 118.0 lb

## 2018-02-02 DIAGNOSIS — D126 Benign neoplasm of colon, unspecified: Secondary | ICD-10-CM

## 2018-02-02 DIAGNOSIS — M791 Myalgia, unspecified site: Secondary | ICD-10-CM | POA: Diagnosis not present

## 2018-02-02 NOTE — Progress Notes (Signed)
Date:  02/02/2018   Name:  Lindsey Golden   DOB:  1945-10-24   MRN:  [id number]   Chief Complaint: Tingling (Tingling and numbness in hands and feet. Legs feel like they are burning and tingling. Started 01/20/2018. Progressively getting worse but on and off. )  Started the day after her colonoscopy after she had held her supplements for a week. Started back on supplements and seemed to feel better.  Legs have been burning - sometimes in feet and sometimes in the whole leg.  Tylenol has helped. Sx are intermittent and not related to any position or movement. 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 She has has sclerotherapy.  Arm sx started at the same time.  Her left arm had entrapment surgery so she has issues anyway. She has tingling in both hands intermittently but no pain or burning.  Overall she is feeling better but wanted to be reassured.   Review of Systems  Constitutional: Negative for chills, fatigue and fever.  Eyes: Negative for visual disturbance.  Respiratory: Negative for chest tightness, shortness of breath and wheezing.   Cardiovascular: Negative for chest pain, palpitations and leg swelling.  Musculoskeletal: Positive for arthralgias and myalgias.  Skin: Positive for rash (on right heel - ). Negative for color change.  Neurological: Positive for weakness (chronic) and numbness. Negative for dizziness, tremors and headaches.  Psychiatric/Behavioral: Negative for sleep disturbance.    Patient Active Problem List   Diagnosis Date Noted  . Adenomatous colon polyp 10/07/2017  . 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 Seasonal allergies 05/13/2016  . Degenerative arthritis of knee, bilateral 11/26/2015  . Hypothyroidism 11/28/2014  . Asthma 11/28/2014    Allergies  Allergen Reactions  . Aspirin Hives  . Penicillins Other (See Comments)  . Dairy Aid [Lactase] Diarrhea    Past Surgical History:    Procedure Laterality Date  . 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 BREAST SURGERY    . COLONOSCOPY  03/19/2012   tubular adenoma - repeat 5 years Dr. Vira Agar  . KNEE SURGERY     five different surgery    Social History   Tobacco Use  . Smoking status: Current Some Day Smoker    Packs/day: 0.15    Types: Cigarettes  . Smokeless tobacco: Never Used  . Tobacco comment: 2-3 cigarettes daily  Substance Use Topics  . Alcohol use: No  . Drug use: No     Medication list has been reviewed and updated.  Current Meds  Medication Sig  . budesonide-formoterol (SYMBICORT) 160-4.5 MCG/ACT inhaler Inhale 2 puffs into the lungs 2 (two) times daily.  . cetirizine (ZYRTEC) 10 MG tablet Take 1 tablet (10 mg total) by mouth daily.  . ferrous sulfate 220 (44 Fe) MG/5ML solution Take 220 mg by mouth daily.  Marland Kitchen levothyroxine (SYNTHROID, LEVOTHROID) 75 MCG tablet TAKE 1 TABLET BY MOUTH EVERY DAY  . Multiple Vitamins-Minerals (MULTIPLE VITAMINS/WOMENS PO) Take by mouth.  . VENTOLIN HFA 108 (90 Base) MCG/ACT inhaler INHALE 2 PUFFS INTO THE LUNGS EVERY 6 HOURS AS NEEDED FOR WHEEZING OR SHORTNESS OF BREATH    PHQ 2/9 Scores 10/07/2017 04/02/2017 09/02/2016 05/13/2016  PHQ - 2 Score 0 0 0 0    Physical Exam  Constitutional: She is oriented to person, place, and time. She appears well-developed. No distress.  HENT:  Head: Normocephalic and atraumatic.  Neck: Normal range of motion.  Cardiovascular: Normal rate, regular rhythm and normal heart sounds.  Pulmonary/Chest: Effort normal and breath sounds normal. No respiratory distress.  Musculoskeletal: Normal range of motion.  LEs - no edema, pulses intact, no tenderness and no inflamed varicose veins, sensation normal Reflexes intact  UEs - grip weakness bilaterally, reflexes intact, pulses intact, sensation normal  Neurological: She is alert and oriented to person, place, and time.  Skin: Skin is warm and dry. No rash noted.      Psychiatric: She has a normal mood and affect. Her behavior is normal. Thought content normal.  Nursing note and vitals reviewed.   BP 112/70 (BP Location: Right Arm, Patient Position: Sitting, Cuff Size: Normal)   Pulse 71   Ht 5\' 4"  (1.626 m)   Wt 118 lb (53.5 kg)   SpO2 99%   BMI 20.25 kg/m   Assessment and Plan: 1. Myalgia Likely due to electrolyte and mineral depletion from colon prep Continue supplements and return if needed  2. Adenomatous polyp of colon, unspecified part of colon Recently completed - return in 5 years   No orders of the defined types were placed in this encounter.   Partially dictated using Editor, commissioning. Any errors are unintentional.  Halina Maidens, MD [facility]  02/02/2018

## 2018-02-16 ENCOUNTER — Ambulatory Visit (INDEPENDENT_AMBULATORY_CARE_PROVIDER_SITE_OTHER): Payer: Medicare Other | Admitting: Internal Medicine

## 2018-02-16 ENCOUNTER — Encounter: Payer: Self-pay | Admitting: Internal Medicine

## 2018-02-16 VITALS — BP 102/64 | HR 72 | Ht 64.0 in | Wt 118.0 lb

## 2018-02-16 DIAGNOSIS — E559 Vitamin D deficiency, unspecified: Secondary | ICD-10-CM | POA: Insufficient documentation

## 2018-02-16 DIAGNOSIS — R202 Paresthesia of skin: Secondary | ICD-10-CM

## 2018-02-16 DIAGNOSIS — E034 Atrophy of thyroid (acquired): Secondary | ICD-10-CM

## 2018-02-16 NOTE — Progress Notes (Signed)
Date:  02/16/2018   Name:  Lindsey Golden   DOB:  [birth year]   MRN:  [id number]   Chief Complaint: Numbness (Still taking supplements as discussed 2 weeks ago. Still having numbness and tingling in hands and legs.) HPI Complaint from last visit felt to be due to depletion from colonoscopy prep: "Legs have been burning - sometimes in feet and sometimes in the whole leg.  Tylenol has helped. Sx are intermittent and not related to any position or movement. 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 She has has sclerotherapy.  Arm sx started at the same time.  Her left arm had entrapment surgery so she has issues anyway. She has tingling in both hands intermittently but no pain or burning."  Now she complains of persistent tingling in hands and legs. She continues on her supplements.  Some days are better than others.  Review of Systems  Constitutional: Negative for chills, fatigue and fever.  Eyes: Negative for visual disturbance.  Respiratory: Negative for chest tightness, shortness of breath and wheezing.   Cardiovascular: Negative for chest pain, palpitations and leg swelling.  Musculoskeletal: Positive for arthralgias and myalgias.  Skin: Negative for color change and rash.  Neurological: Positive for weakness (chronic) and numbness. Negative for dizziness, tremors and headaches.  Psychiatric/Behavioral: Negative for sleep disturbance.    Patient Active Problem List   Diagnosis Date Noted  . Myalgia 02/02/2018  . Adenomatous colon polyp 10/07/2017  . 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 Seasonal allergies 05/13/2016  . Degenerative arthritis of knee, bilateral 11/26/2015  . Hypothyroidism 11/28/2014  . Asthma 11/28/2014    Allergies  Allergen Reactions  . Aspirin Hives  . Penicillins Other (See Comments)  . Dairy Aid [Lactase] Diarrhea    Past Surgical History:  Procedure Laterality Date  . 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 BREAST SURGERY    . COLONOSCOPY  03/19/2012   tubular adenoma - repeat 5 years Dr. Vira Agar  . KNEE SURGERY     five different surgery    Social History   Tobacco Use  . Smoking status: Current Some Day Smoker    Packs/day: 0.15    Types: Cigarettes  . Smokeless tobacco: Never Used  . Tobacco comment: 2-3 cigarettes daily  Substance Use Topics  . Alcohol use: No  . Drug use: No     Medication list has been reviewed and updated.  Current Meds  Medication Sig  . budesonide-formoterol (SYMBICORT) 160-4.5 MCG/ACT inhaler Inhale 2 puffs into the lungs 2 (two) times daily.  . cetirizine (ZYRTEC) 10 MG tablet Take 1 tablet (10 mg total) by mouth daily.  . ferrous sulfate 220 (44 Fe) MG/5ML solution Take 220 mg by mouth daily.  Marland Kitchen levothyroxine (SYNTHROID, LEVOTHROID) 75 MCG tablet TAKE 1 TABLET BY MOUTH EVERY DAY  . Multiple Vitamins-Minerals (MULTIPLE VITAMINS/WOMENS PO) Take by mouth.  . VENTOLIN HFA 108 (90 Base) MCG/ACT inhaler INHALE 2 PUFFS INTO THE LUNGS EVERY 6 HOURS AS NEEDED FOR WHEEZING OR SHORTNESS OF BREATH    PHQ 2/9 Scores 10/07/2017 04/02/2017 09/02/2016 05/13/2016  PHQ - 2 Score 0 0 0 0    Physical Exam  Constitutional: She is oriented to person, place, and time. She appears well-developed. No distress.  HENT:  Head: Normocephalic and atraumatic.  Neck: Normal range of motion. Neck supple.  Cardiovascular: Normal rate, regular rhythm and normal heart sounds.  Pulmonary/Chest: Effort normal and breath sounds normal. No respiratory distress.  Musculoskeletal: She exhibits no edema.  Neurological: She is alert and oriented to person, place, and time.  Skin: Skin is warm and dry. No rash noted.  Psychiatric: She has a normal mood and affect. Her behavior is normal. Thought content normal.  Nursing note and vitals reviewed.   BP 102/64   Pulse 72   Ht 5\' 4"  (1.626 m)   Wt 118 lb (53.5 kg)   SpO2 98%   BMI 20.25 kg/m    Assessment and Plan: 1. Hypothyroidism due to acquired atrophy of thyroid Check labs - TSH  2. Tingling in extremities Uncertain cause - will rule out common metabolic problems - Comprehensive metabolic panel - Magnesium - Vitamin B12  3. Vitamin D deficiency - VITAMIN D 25 Hydroxy (Vit-D Deficiency, Fractures)   No orders of the defined types were placed in this encounter.   Partially dictated using Editor, commissioning. Any errors are unintentional.  Halina Maidens, MD [facility]  02/16/2018

## 2018-02-17 ENCOUNTER — Other Ambulatory Visit: Payer: Self-pay | Admitting: Internal Medicine

## 2018-02-17 DIAGNOSIS — M791 Myalgia, unspecified site: Secondary | ICD-10-CM

## 2018-02-17 DIAGNOSIS — R202 Paresthesia of skin: Secondary | ICD-10-CM

## 2018-02-17 LAB — COMPREHENSIVE METABOLIC PANEL
ALBUMIN: 4.3 g/dL (ref 3.5–4.8)
ALT: 7 IU/L (ref 0–32)
AST: 18 IU/L (ref 0–40)
Albumin/Globulin Ratio: 2 (ref 1.2–2.2)
Alkaline Phosphatase: 65 IU/L (ref 39–117)
BILIRUBIN TOTAL: 0.4 mg/dL (ref 0.0–1.2)
BUN / CREAT RATIO: 22 (ref 12–28)
BUN: 16 mg/dL (ref 8–27)
CALCIUM: 8.9 mg/dL (ref 8.7–10.3)
CHLORIDE: 102 mmol/L (ref 96–106)
CO2: 21 mmol/L (ref 20–29)
CREATININE: 0.72 mg/dL (ref 0.57–1.00)
GFR, EST AFRICAN AMERICAN: 98 mL/min/{1.73_m2} (ref >59)
GFR, EST NON AFRICAN AMERICAN: 85 mL/min/{1.73_m2} (ref >59)
GLUCOSE: 87 mg/dL (ref 65–99)
Globulin, Total: 2.1 g/dL (ref 1.5–4.5)
Potassium: 4.4 mmol/L (ref 3.5–5.2)
Sodium: 142 mmol/L (ref 134–144)
Total Protein: 6.4 g/dL (ref 6.0–8.5)

## 2018-02-17 LAB — TSH: TSH: 0.775 u[IU]/mL (ref 0.450–4.500)

## 2018-02-17 LAB — VITAMIN D 25 HYDROXY (VIT D DEFICIENCY, FRACTURES): VIT D 25 HYDROXY: 62.3 ng/mL (ref 30.0–100.0)

## 2018-02-17 LAB — MAGNESIUM: Magnesium: 2 mg/dL (ref 1.6–2.3)

## 2018-02-17 LAB — VITAMIN B12: Vitamin B-12: 603 pg/mL (ref 232–1245)

## 2018-02-17 NOTE — Progress Notes (Signed)
Patient informed of labs.Would like referral to Neuro for further testing. Would like to go to [place] area.

## 2018-02-18 ENCOUNTER — Other Ambulatory Visit: Payer: Self-pay | Admitting: Internal Medicine

## 2018-03-22 ENCOUNTER — Other Ambulatory Visit: Payer: Self-pay | Admitting: Internal Medicine

## 2018-03-25 DIAGNOSIS — R202 Paresthesia of skin: Secondary | ICD-10-CM | POA: Diagnosis not present

## 2018-03-27 DIAGNOSIS — Z23 Encounter for immunization: Secondary | ICD-10-CM | POA: Diagnosis not present

## 2018-03-31 ENCOUNTER — Telehealth: Payer: Self-pay | Admitting: Internal Medicine

## 2018-03-31 NOTE — Telephone Encounter (Signed)
Called to schedule Medicare Annual Wellness Visit with the Nurse Health Advisor.   If patient returns call, please note: their last AWV was on 04/02/17 please schedule AWV with NHA any date AFTER 04/02/2018  Thank you! For any questions please contact: Jill Alexanders [phone] or Skype at: [email or web address]

## 2018-04-05 ENCOUNTER — Ambulatory Visit: Payer: Medicare Other

## 2018-04-21 ENCOUNTER — Ambulatory Visit (INDEPENDENT_AMBULATORY_CARE_PROVIDER_SITE_OTHER): Payer: Medicare Other

## 2018-04-21 VITALS — BP 102/66 | HR 64 | Temp 97.9°F | Resp 16 | Ht 64.0 in | Wt 119.0 lb

## 2018-04-21 DIAGNOSIS — Z0000 Encounter for general adult medical examination without abnormal findings: Secondary | ICD-10-CM

## 2018-04-21 NOTE — Patient Instructions (Signed)
Lindsey Golden , Thank you for taking time to come for your Medicare Wellness Visit. I appreciate your ongoing commitment to your health goals. Please review the following plan we discussed and let me know if I can assist you in the future.   Screening recommendations/referrals: Colonoscopy: completed 01/19/18 Mammogram: postponed Bone Density: completed 04/10/2009 Recommended yearly ophthalmology/optometry visit for glaucoma screening and checkup Recommended yearly dental visit for hygiene and checkup  Vaccinations: Influenza vaccine: done at [facility] - we will request a copy Pneumococcal vaccine: done 03/08/2014 due next year Tdap vaccine: done 09/29/14 Shingles vaccine: Shingrix discussed    Advanced directives: Please bring a copy of your health care power of attorney and living will to the office at your convenience.  Conditions/risks identified: Recommend quitting smoking. If you wish to quit smoking, help is available. For free tobacco cessation program offerings call the [facility] at 863-708-5522 or Live Well Line at (340)431-1191. You may also visit [email or web address] or email [email or web address] [email or web address] for more information on other programs.   1-800- [place]  Next appointment: Please follow up in one year for your Medicare Annual Wellness visit.     Preventive Care 70 Years and Older, Female Preventive care refers to lifestyle choices and visits with your health care provider that can promote health and wellness. What does preventive care include?  A yearly physical exam. This is also called an annual well check.  Dental exams once or twice a year.  Routine eye exams. Ask your health care provider how often you should have your eyes checked.  Personal lifestyle choices, including:  Daily care of your teeth and gums.  Regular physical activity.  Eating a healthy diet.  Avoiding tobacco and drug use.  Limiting alcohol use.  Practicing  safe sex.  Taking low-dose aspirin every day.  Taking vitamin and mineral supplements as recommended by your health care provider. What happens during an annual well check? The services and screenings done by your health care provider during your annual well check will depend on your age, overall health, lifestyle risk factors, and family history of disease. Counseling  Your health care provider may ask you questions about your:  Alcohol use.  Tobacco use.  Drug use.  Emotional well-being.  Home and relationship well-being.  Sexual activity.  Eating habits.  History of falls.  Memory and ability to understand (cognition).  Work and work Statistician.  Reproductive health. Screening  You may have the following tests or measurements:  Height, weight, and BMI.  Blood pressure.  Lipid and cholesterol levels. These may be checked every 5 years, or more frequently if you are over 41 years old.  Skin check.  Lung cancer screening. You may have this screening every year starting at age 70 if you have a 30-pack-year history of smoking and currently smoke or have quit within the past 15 years.  Fecal occult blood test (FOBT) of the stool. You may have this test every year starting at age 70.  Flexible sigmoidoscopy or colonoscopy. You may have a sigmoidoscopy every 5 years or a colonoscopy every 10 years starting at age 65.  Hepatitis C blood test.  Hepatitis B blood test.  Sexually transmitted disease (STD) testing.  Diabetes screening. This is done by checking your blood sugar (glucose) after you have not eaten for a while (fasting). You may have this done every 1-3 years.  Bone density scan. This is done to screen for osteoporosis. You may have this  done starting at age 34.  Mammogram. This may be done every 1-2 years. Talk to your health care provider about how often you should have regular mammograms. Talk with your health care provider about your test results,  treatment options, and if necessary, the need for more tests. Vaccines  Your health care provider may recommend certain vaccines, such as:  Influenza vaccine. This is recommended every year.  Tetanus, diphtheria, and acellular pertussis (Tdap, Td) vaccine. You may need a Td booster every 10 years.  Zoster vaccine. You may need this after age 70.  Pneumococcal 13-valent conjugate (PCV13) vaccine. One dose is recommended after age 70.  Pneumococcal polysaccharide (PPSV23) vaccine. One dose is recommended after age 70. Talk to your health care provider about which screenings and vaccines you need and how often you need them. This information is not intended to replace advice given to you by your health care provider. Make sure you discuss any questions you have with your health care provider. Document Released: 06/22/2015 Document Revised: 02/13/2016 Document Reviewed: 03/27/2015 Elsevier Interactive Patient Education  2017 [place] Prevention in the Home Falls can cause injuries. They can happen to people of all ages. There are many things you can do to make your home safe and to help prevent falls. What can I do on the outside of my home?  Regularly fix the edges of walkways and driveways and fix any cracks.  Remove anything that might make you trip as you walk through a door, such as a raised step or threshold.  Trim any bushes or trees on the path to your home.  Use bright outdoor lighting.  Clear any walking paths of anything that might make someone trip, such as rocks or tools.  Regularly check to see if handrails are loose or broken. Make sure that both sides of any steps have handrails.  Any raised decks and porches should have guardrails on the edges.  Have any leaves, snow, or ice cleared regularly.  Use sand or salt on walking paths during winter.  Clean up any spills in your garage right away. This includes oil or grease spills. What can I do in the  bathroom?  Use night lights.  Install grab bars by the toilet and in the tub and shower. Do not use towel bars as grab bars.  Use non-skid mats or decals in the tub or shower.  If you need to sit down in the shower, use a plastic, non-slip stool.  Keep the floor dry. Clean up any water that spills on the floor as soon as it happens.  Remove soap buildup in the tub or shower regularly.  Attach bath mats securely with double-sided non-slip rug tape.  Do not have throw rugs and other things on the floor that can make you trip. What can I do in the bedroom?  Use night lights.  Make sure that you have a light by your bed that is easy to reach.  Do not use any sheets or blankets that are too big for your bed. They should not hang down onto the floor.  Have a firm chair that has side arms. You can use this for support while you get dressed.  Do not have throw rugs and other things on the floor that can make you trip. What can I do in the kitchen?  Clean up any spills right away.  Avoid walking on wet floors.  Keep items that you use a lot in easy-to-reach places.  If you need to reach something above you, use a strong step stool that has a grab bar.  Keep electrical cords out of the way.  Do not use floor polish or wax that makes floors slippery. If you must use wax, use non-skid floor wax.  Do not have throw rugs and other things on the floor that can make you trip. What can I do with my stairs?  Do not leave any items on the stairs.  Make sure that there are handrails on both sides of the stairs and use them. Fix handrails that are broken or loose. Make sure that handrails are as long as the stairways.  Check any carpeting to make sure that it is firmly attached to the stairs. Fix any carpet that is loose or worn.  Avoid having throw rugs at the top or bottom of the stairs. If you do have throw rugs, attach them to the floor with carpet tape.  Make sure that you have a  light switch at the top of the stairs and the bottom of the stairs. If you do not have them, ask someone to add them for you. What else can I do to help prevent falls?  Wear shoes that:  Do not have high heels.  Have rubber bottoms.  Are comfortable and fit you well.  Are closed at the toe. Do not wear sandals.  If you use a stepladder:  Make sure that it is fully opened. Do not climb a closed stepladder.  Make sure that both sides of the stepladder are locked into place.  Ask someone to hold it for you, if possible.  Clearly mark and make sure that you can see:  Any grab bars or handrails.  First and last steps.  Where the edge of each step is.  Use tools that help you move around (mobility aids) if they are needed. These include:  Canes.  Walkers.  Scooters.  Crutches.  Turn on the lights when you go into a dark area. Replace any light bulbs as soon as they burn out.  Set up your furniture so you have a clear path. Avoid moving your furniture around.  If any of your floors are uneven, fix them.  If there are any pets around you, be aware of where they are.  Review your medicines with your doctor. Some medicines can make you feel dizzy. This can increase your chance of falling. Ask your doctor what other things that you can do to help prevent falls. This information is not intended to replace advice given to you by your health care provider. Make sure you discuss any questions you have with your health care provider. Document Released: 03/22/2009 Document Revised: 11/01/2015 Document Reviewed: 06/30/2014 Elsevier Interactive Patient Education  2017 [employer].

## 2018-04-21 NOTE — Progress Notes (Signed)
Subjective:   Lindsey Golden is a 72 y.o. female who presents for Medicare Annual (Subsequent) preventive examination.  Review of Systems:   Cardiac Risk Factors include: advanced age (>12men, >26 women);smoking/ tobacco exposure     Objective:     Vitals: BP 102/66 (BP Location: Left Arm, Patient Position: Standing, Cuff Size: Normal)   Pulse 64   Temp 97.9 F (36.6 C) (Oral)   Resp 16   Ht 5\' 4"  (1.626 m)   Wt 119 lb (54 kg)   BMI 20.43 kg/m   Body mass index is 20.43 kg/m.  Advanced Directives 04/21/2018 04/02/2017  Does Patient Have a Medical Advance Directive? Yes Yes  Type of Paramedic of [place];Living will [place];Living will  Copy of [place] in Chart? No - copy requested No - copy requested    Tobacco Social History   Tobacco Use  Smoking Status Current Some Day Smoker  . Packs/day: 0.15  . Types: Cigarettes  Smokeless Tobacco Never Used  Tobacco Comment   2-3 cigarettes daily     Ready to quit: Not Answered Counseling given: Not Answered Comment: 2-3 cigarettes daily   Clinical Intake:  Pre-visit preparation completed: Yes  Pain : No/denies pain     Nutritional Status: BMI of 19-24  Normal Diabetes: No  How often do you need to have someone help you when you read instructions, pamphlets, or other written materials from your doctor or pharmacy?: 1 - Never What is the last grade level you completed in school?: some college  Interpreter Needed?: No  Information entered by :: Clemetine Marker LPN  Past Medical History:  Diagnosis Date  . Allergy   . Asthma    mild intermittent due to allergies.  . Osteoarthritis   . Thyroid disease    Past Surgical History:  Procedure Laterality Date  . 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 BREAST SURGERY    . COLONOSCOPY  03/19/2012   tubular adenoma - repeat 5 years Dr. Vira Agar  . KNEE SURGERY     five different  surgery   Family History  Problem Relation Age of Onset  . Depression Mother   . Hearing loss Mother   . Mental illness Mother   . Miscarriages / [place] Mother   . Asthma Father   . Kidney disease Father   . Stroke Father   . Varicose Veins Father    Social History   Socioeconomic History  . Marital status: Married    Spouse name: Not on file  . Number of children: 0  . Years of education: Not on file  . Highest education level: Some college, no degree  Occupational History  . Occupation: retired  Scientific laboratory technician  . Financial resource strain: Not hard at all  . Food insecurity:    Worry: Never true    Inability: Never true  . Transportation needs:    Medical: No    Non-medical: No  Tobacco Use  . Smoking status: Current Some Day Smoker    Packs/day: 0.15    Types: Cigarettes  . Smokeless tobacco: Never Used  . Tobacco comment: 2-3 cigarettes daily  Substance and Sexual Activity  . Alcohol use: No  . Drug use: No  . Sexual activity: Never  Lifestyle  . Physical activity:    Days per week: 0 days    Minutes per session: 0 min  . Stress: Patient refused  Relationships  . Social connections:    Talks on phone: More than three times a week    Gets together: More than three times a week    Attends religious service: Never    Active member of club or organization: No    Attends meetings of clubs or organizations: Never    Relationship status: Married  Other Topics Concern  . Not on file  Social History Narrative  . Not on file    Outpatient Encounter Medications as of 04/21/2018  Medication Sig  . budesonide-formoterol (SYMBICORT) 160-4.5 MCG/ACT inhaler Inhale 2 puffs into the lungs 2 (two) times daily.  . cetirizine (ZYRTEC) 10 MG tablet Take 1 tablet (10 mg total) by mouth daily.  . ferrous sulfate 325 (65 FE) MG tablet Take 325 mg by mouth daily with breakfast.  . levothyroxine (SYNTHROID, LEVOTHROID) 75 MCG tablet TAKE 1 TABLET BY MOUTH EVERY DAY  .  Multiple Vitamins-Minerals (MULTIPLE VITAMINS/WOMENS PO) Take by mouth.  . VENTOLIN HFA 108 (90 Base) MCG/ACT inhaler INHALE 2 PUFFS BY MOUTH EVERY 6 HOURS ASNEEDED SHORTNESS OF BREATH  . ferrous sulfate 220 (44 Fe) MG/5ML solution Take 220 mg by mouth daily.   No facility-administered encounter medications on file as of 04/21/2018.     Activities of Daily Living In your present state of health, do you have any difficulty performing the following activities: 04/21/2018  Hearing? N  Comment declines hearing aids  Vision? N  Comment wears glasses  Difficulty concentrating or making decisions? N  Walking or climbing stairs? N  Dressing or bathing? N  Doing errands, shopping? N  Preparing Food and eating ? N  Using the Toilet? N  In the past six months, have you accidently leaked urine? N  Do you have problems with loss of bowel control? N  Managing your Medications? N  Managing your Finances? N  Housekeeping or managing your Housekeeping? N  Some recent data might be hidden    Patient Care Team: Glean Hess, MD as PCP - General (Internal Medicine)    Assessment:   This is a routine wellness examination for [place].  Exercise Activities and Dietary recommendations Current Exercise Habits: Home exercise routine, Type of exercise: stretching, Time (Minutes): 20, Frequency (Times/Week): 7, Weekly Exercise (Minutes/Week): 140, Exercise limited by: None identified  Goals    . Quit Smoking     Has tried to stop and has days where she is rarely smoking at all.     . Quit smoking / using tobacco     Recommend to attend smoking cessation classes or contact 1-800-QUIT-NOW       Fall Risk Fall Risk  04/21/2018 10/07/2017 04/02/2017 09/02/2016 05/13/2016  Falls in the past year? 0 No No No No  Number falls in past yr: 0 - - - -   FALL RISK PREVENTION PERTAINING TO THE HOME:  Any stairs in or around the home WITH handrails? Yes  Home free of loose throw rugs in walkways, pet  beds, electrical cords, etc? Yes  Adequate lighting in your home to reduce risk of falls? Yes   ASSISTIVE DEVICES UTILIZED TO PREVENT FALLS:  Life alert? No  Use of a cane, walker or w/c? No  Grab bars in the bathroom? Yes  Shower chair or bench in shower? No  Elevated toilet seat or a handicapped toilet? No   DME ORDERS:  DME order needed?  No   TIMED UP AND GO:  Was the test performed? Yes .  Length of time to ambulate 10 feet: 6 sec.   GAIT:  Appearance of gait: Gait stead-fast and without the use of an assistive device.   Education: Fall risk prevention has been discussed.  Intervention(s) required? No   Depression Screen PHQ 2/9 Scores 04/21/2018 10/07/2017 04/02/2017 09/02/2016  PHQ - 2 Score 0 0 0 0     Cognitive Function     6CIT Screen 04/21/2018 04/02/2017  What Year? 0 points 0 points  What month? 0 points 0 points  What time? 0 points 0 points  Count back from 20 0 points 0 points  Months in reverse 0 points 2 points  Repeat phrase 0 points 0 points  Total Score 0 2    Immunization History  Administered Date(s) Administered  . Influenza, High Dose Seasonal PF 04/05/2015, 03/18/2016, 03/06/2017  . Pneumococcal Conjugate-13 03/08/2014  . Pneumococcal Polysaccharide-23 09/17/2012    Qualifies for Shingles Vaccine? Yes  Zostavax completed per patient but not on file. Due for Shingrix. Education has been provided regarding the importance of this vaccine. Pt has been advised to call insurance company to determine out of pocket expense. Advised may also receive vaccine at local pharmacy or Health Dept. Verbalized acceptance and understanding.  Tdap: Up to date  Flu Vaccine: done at [facility] 03/27/18; verified date with pharmacy and requested immunization record.  Pneumococcal Vaccine: due in 2020  Screening Tests Health Maintenance  Topic Date Due  . MAMMOGRAM  10/08/2018 (Originally 10/08/2015)  . COLONOSCOPY  01/20/2023  . TETANUS/TDAP   09/28/2024  . INFLUENZA VACCINE  Completed  . DEXA SCAN  Completed  . Hepatitis C Screening  Completed  . PNA vac Low Risk Adult  Completed    Cancer Screenings:  Colorectal Screening: Completed 01/19/18. Repeat every 3 years per pt. Result attachment unavailable in care everywhere, per last note with Dr. Army Melia pt to repeat every 5 years.   Mammogram: Completed 10/08/14. Pt declines future mammograms due to exam being painful and states no family history. Advised pt to continue SBE.  Bone Density: Completed 03/10/09. Results reflect NORMAL,   Lung Cancer Screening: (Low Dose CT Chest recommended if Age 5-80 years, 30 pack-year currently smoking OR have quit w/in 15years.) does not qualify.    Additional Screening:  Hepatitis C Screening: does qualify; Completed 05/08/17  Vision Screening: Recommended annual ophthalmology exams for early detection of glaucoma and other disorders of the eye. Is the patient up to date with their annual eye exam?  Yes  Who is the provider or what is the name of the office in which the pt attends annual eye exams? Dr. Wyatt Portela  Dental Screening: Recommended annual dental exams for proper oral hygiene  Community Resource Referral:  CRR required this visit?  No      Plan:  I have personally reviewed and addressed the Medicare Annual Wellness questionnaire and have noted the following in the patient's chart:  A. Medical and social history B. Use of alcohol, tobacco or illicit drugs  C. Current medications and supplements D. Functional ability and status E.  Nutritional status F.  Physical activity G. Advance directives H. List of other physicians I.  Hospitalizations, surgeries, and ER visits in previous 12 months J.  [place] such as hearing and vision if needed, cognitive and depression L. Referrals and appointments   In addition, I have reviewed and discussed with patient certain preventive protocols, quality metrics, and best  practice recommendations. A written personalized care plan for  preventive services as well as general preventive health recommendations were provided to patient.   Signed,  Clemetine Marker, LPN Nurse Health Advisor   Nurse Notes: none; requesting flu shot record from [facility]

## 2018-04-22 ENCOUNTER — Other Ambulatory Visit: Payer: Self-pay | Admitting: Internal Medicine

## 2018-05-05 ENCOUNTER — Ambulatory Visit (INDEPENDENT_AMBULATORY_CARE_PROVIDER_SITE_OTHER): Payer: Medicare Other | Admitting: Internal Medicine

## 2018-05-05 ENCOUNTER — Encounter: Payer: Self-pay | Admitting: Internal Medicine

## 2018-05-05 ENCOUNTER — Ambulatory Visit
Admission: RE | Admit: 2018-05-05 | Discharge: 2018-05-05 | Disposition: A | Discharge status: Home / Self Care | Payer: Medicare Other | Source: Ambulatory Visit | Attending: Internal Medicine | Admitting: Internal Medicine

## 2018-05-05 ENCOUNTER — Other Ambulatory Visit
Admission: RE | Admit: 2018-05-05 | Discharge: 2018-05-05 | Disposition: A | Discharge status: Home / Self Care | Payer: Medicare Other | Source: Ambulatory Visit | Attending: Internal Medicine | Admitting: Internal Medicine

## 2018-05-05 VITALS — BP 124/78 | HR 84 | Ht 64.0 in | Wt 118.0 lb

## 2018-05-05 DIAGNOSIS — R0781 Pleurodynia: Secondary | ICD-10-CM

## 2018-05-05 DIAGNOSIS — Z1231 Encounter for screening mammogram for malignant neoplasm of breast: Secondary | ICD-10-CM

## 2018-05-05 DIAGNOSIS — R1011 Right upper quadrant pain: Secondary | ICD-10-CM

## 2018-05-05 LAB — CBC WITH DIFFERENTIAL/PLATELET
Abs Immature Granulocytes: 0.03 10*3/uL (ref 0.00–0.07)
BASOS ABS: 0.1 10*3/uL (ref 0.0–0.1)
BASOS PCT: 1 %
EOS ABS: 0.1 10*3/uL (ref 0.0–0.5)
EOS PCT: 1 %
HCT: 42.5 % (ref 36.0–46.0)
Hemoglobin: 13.8 g/dL (ref 12.0–15.0)
IMMATURE GRANULOCYTES: 0 %
Lymphocytes Relative: 25 %
Lymphs Abs: 2.5 10*3/uL (ref 0.7–4.0)
MCH: 31.9 pg (ref 26.0–34.0)
MCHC: 32.5 g/dL (ref 30.0–36.0)
MCV: 98.4 fL (ref 80.0–100.0)
MONOS PCT: 7 %
Monocytes Absolute: 0.7 10*3/uL (ref 0.1–1.0)
NEUTROS PCT: 66 %
NRBC: 0 % (ref 0.0–0.2)
Neutro Abs: 6.6 10*3/uL (ref 1.7–7.7)
PLATELETS: 264 10*3/uL (ref 150–400)
RBC: 4.32 MIL/uL (ref 3.87–5.11)
RDW: 11.8 % (ref 11.5–15.5)
WBC: 10 10*3/uL (ref 4.0–10.5)

## 2018-05-05 LAB — COMPREHENSIVE METABOLIC PANEL
ALK PHOS: 66 U/L (ref 38–126)
ALT: 13 U/L (ref 0–44)
ANION GAP: 8 (ref 5–15)
AST: 25 U/L (ref 15–41)
Albumin: 4.2 g/dL (ref 3.5–5.0)
BILIRUBIN TOTAL: 0.8 mg/dL (ref 0.3–1.2)
BUN: 14 mg/dL (ref 8–23)
CALCIUM: 8.9 mg/dL (ref 8.9–10.3)
CO2: 25 mmol/L (ref 22–32)
CREATININE: 0.61 mg/dL (ref 0.44–1.00)
Chloride: 104 mmol/L (ref 98–111)
GFR calc non Af Amer: >60 mL/min (ref >60)
Glucose, Bld: 107 mg/dL — ABNORMAL HIGH (ref 70–99)
Potassium: 4.1 mmol/L (ref 3.5–5.1)
Sodium: 137 mmol/L (ref 135–145)
Total Protein: 7.3 g/dL (ref 6.5–8.1)

## 2018-05-05 NOTE — Progress Notes (Signed)
Date:  05/05/2018   Name:  Aniylah Avans Golden   DOB:  04-29-1946   MRN:  [id number]   Chief Complaint: Abdominal Pain (X 10 days. Pain is located on Rt ride right under rib right of belly button. Pain feels like 6-7 when worst. At a 2 now. )  Abdominal Pain  This is a new problem. The current episode started 1 to 4 weeks ago. The onset quality is gradual. The problem occurs daily. The problem has been gradually worsening. The pain is located in the RUQ. The pain is mild. The quality of the pain is aching and cramping. The abdominal pain does not radiate. Pertinent negatives include no arthralgias, constipation, diarrhea, fever, headaches, melena, nausea or vomiting. Nothing aggravates the pain. The pain is relieved by nothing.    Review of Systems  Constitutional: Negative for fever.  Respiratory: Negative for cough, shortness of breath and wheezing.   Cardiovascular: Negative for chest pain and palpitations.  Gastrointestinal: Positive for abdominal pain. Negative for constipation, diarrhea, melena, nausea and vomiting.  Musculoskeletal: Negative for arthralgias.  Neurological: Negative for dizziness and headaches.    Patient Active Problem List   Diagnosis Date Noted  . Tingling 03/25/2018  . Vitamin D deficiency 02/16/2018  . Tingling in extremities 02/16/2018  . Myalgia 02/02/2018  . Adenomatous colon polyp 10/07/2017  . 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 Seasonal allergies 05/13/2016  . Degenerative arthritis of knee, bilateral 11/26/2015  . Hypothyroidism 11/28/2014  . Asthma 11/28/2014    Allergies  Allergen Reactions  . Aspirin Hives  . Penicillins Other (See Comments)  . Dairy Aid [Lactase] Diarrhea    Past Surgical History:  Procedure Laterality Date  . 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 BREAST SURGERY    . COLONOSCOPY  03/19/2012   tubular adenoma - repeat 5 years Dr. Vira Agar  . KNEE SURGERY     five  different surgery    Social History   Tobacco Use  . Smoking status: Current Some Day Smoker    Packs/day: 0.15    Types: Cigarettes  . Smokeless tobacco: Never Used  . Tobacco comment: 2-3 cigarettes daily  Substance Use Topics  . Alcohol use: No  . Drug use: No     Medication list has been reviewed and updated.  Current Meds  Medication Sig  . budesonide-formoterol (SYMBICORT) 160-4.5 MCG/ACT inhaler Inhale 2 puffs into the lungs 2 (two) times daily.  . cetirizine (ZYRTEC) 10 MG tablet Take 1 tablet (10 mg total) by mouth daily.  . ferrous sulfate 325 (65 FE) MG tablet Take 325 mg by mouth daily with breakfast.  . levothyroxine (SYNTHROID, LEVOTHROID) 75 MCG tablet TAKE 1 TABLET BY MOUTH EVERY DAY  . Multiple Vitamins-Minerals (MULTIPLE VITAMINS/WOMENS PO) Take by mouth.  . VENTOLIN HFA 108 (90 Base) MCG/ACT inhaler INHALE 2 PUFFS BY MOUTH EVERY 6 HOURS ASNEEDED SHORTNESS OF BREATH    PHQ 2/9 Scores 04/21/2018 10/07/2017 04/02/2017 09/02/2016  PHQ - 2 Score 0 0 0 0    Physical Exam  Constitutional: She is oriented to person, place, and time. She appears well-developed. No distress.  HENT:  Head: Normocephalic and atraumatic.  Cardiovascular: Normal rate and regular rhythm.  Pulmonary/Chest: Effort normal. No respiratory distress. She exhibits tenderness (tender over right lower anterior and lateral ribs).  Abdominal: Soft. Normal appearance. She exhibits no distension, no fluid wave and no mass. Bowel sounds are decreased. There is  tenderness in the right upper quadrant. There is no rebound, no guarding, no CVA tenderness and no tenderness at McBurney's point. No hernia.  Musculoskeletal: Normal range of motion.  Neurological: She is alert and oriented to person, place, and time.  Skin: Skin is warm and dry. No rash noted.  Psychiatric: She has a normal mood and affect. Her behavior is normal. Thought content normal.  Nursing note and vitals reviewed.   BP 124/78 (BP  Location: Right Arm, Patient Position: Sitting, Cuff Size: Normal)   Pulse 84   Ht 5\' 4"  (1.626 m)   Wt 118 lb (53.5 kg)   SpO2 98%   BMI 20.25 kg/m   Assessment and Plan: 1. RUQ abdominal pain Suspect gall bladder - Comprehensive metabolic panel - CBC with Differential/Platelet - US Abdomen Limited RUQ; Future  2. Rib pain on right side - DG Chest 2 View; Future  3. Encounter for screening mammogram for breast cancer - MM 3D SCREEN BREAST BILATERAL; Future   Partially dictated using Editor, commissioning. Any errors are unintentional.  Halina Maidens, MD [facility]  05/05/2018

## 2018-05-10 ENCOUNTER — Other Ambulatory Visit: Payer: Self-pay | Admitting: Internal Medicine

## 2018-05-10 ENCOUNTER — Ambulatory Visit: Payer: Medicare Other | Admitting: Internal Medicine

## 2018-05-10 ENCOUNTER — Ambulatory Visit
Admission: RE | Admit: 2018-05-10 | Discharge: 2018-05-10 | Disposition: A | Discharge status: Home / Self Care | Payer: Medicare Other | Source: Ambulatory Visit | Attending: Internal Medicine | Admitting: Internal Medicine

## 2018-05-10 DIAGNOSIS — R1011 Right upper quadrant pain: Secondary | ICD-10-CM | POA: Diagnosis not present

## 2018-05-10 NOTE — Progress Notes (Signed)
Patient informed. Would like referral to see GI. Agrees to being seen here in [place]. Said she is "worrying to death" and pain is still present and no better so would like appt as soon as can get in.   Please Advise.

## 2018-05-11 ENCOUNTER — Telehealth: Payer: Self-pay

## 2018-05-11 NOTE — Telephone Encounter (Signed)
Patient called stating she has an appt with Dr Allen Norris on Jan 6th from referral we sent for her. She stated she does not believe can wait this long as she is uncomfortable and her anxiety is bad since she does not know what is causing her problem. Wants to know if we can send her to somewhere else.  Please Advise.

## 2018-05-11 NOTE — Telephone Encounter (Signed)
Patient informed. Told to call our office if need to be seen before appt with Dr Allen Norris.

## 2018-05-11 NOTE — Telephone Encounter (Signed)
She can come back to see me.

## 2018-05-12 ENCOUNTER — Ambulatory Visit: Payer: Medicare Other | Admitting: Internal Medicine

## 2018-05-26 ENCOUNTER — Other Ambulatory Visit: Payer: Self-pay | Admitting: Internal Medicine

## 2018-05-26 ENCOUNTER — Ambulatory Visit
Admission: RE | Admit: 2018-05-26 | Discharge: 2018-05-26 | Disposition: A | Discharge status: Home / Self Care | Payer: Medicare Other | Source: Ambulatory Visit | Attending: Internal Medicine | Admitting: Internal Medicine

## 2018-05-26 ENCOUNTER — Encounter: Payer: Self-pay | Admitting: Radiology

## 2018-05-26 DIAGNOSIS — Z1231 Encounter for screening mammogram for malignant neoplasm of breast: Secondary | ICD-10-CM | POA: Diagnosis not present

## 2018-05-26 DIAGNOSIS — J4520 Mild intermittent asthma, uncomplicated: Secondary | ICD-10-CM

## 2018-05-27 ENCOUNTER — Encounter: Payer: Self-pay | Admitting: Gastroenterology

## 2018-05-27 ENCOUNTER — Ambulatory Visit (INDEPENDENT_AMBULATORY_CARE_PROVIDER_SITE_OTHER): Payer: Medicare Other | Admitting: Gastroenterology

## 2018-05-27 VITALS — BP 115/76 | HR 88 | Ht 64.0 in | Wt 121.8 lb

## 2018-05-27 DIAGNOSIS — R1011 Right upper quadrant pain: Secondary | ICD-10-CM

## 2018-05-27 DIAGNOSIS — G8929 Other chronic pain: Secondary | ICD-10-CM

## 2018-05-27 MED ORDER — CYCLOBENZAPRINE HCL 5 MG PO TABS: 5.0000 mg | ORAL_TABLET | Freq: Three times a day (TID) | ORAL | 0 refills | Status: DC | PRN

## 2018-05-27 NOTE — Progress Notes (Signed)
Gastroenterology Consultation  Referring Provider:     Glean Hess, MD Primary Care Physician:  Glean Hess, MD Primary Gastroenterologist:  Dr. Allen Norris     Reason for Consultation:     Right-sided abdominal pain        HPI:   Lindsey Golden is a 72 y.o. y/o female referred for consultation & management of right-sided abdominal pain by Dr. Army Melia, Jesse Sans, MD.  This patient comes in today for right side abdominal pain that has been going on for the last few weeks.  The patient had normal blood work and a normal right upper quadrant ultrasound for the abdominal pain.  The patient had a normal colonoscopy done by Dr. Vira Agar approximately 3 months ago for a history of colon polyps.  The patient's previous colonoscopy was in 2016.  The patient is also been seen at Dr. Percell Boston office by his nurse practitioner a few months ago.  She now comes in today because she is concerned about her abdominal pain and she states she is going on a cruise and does not want to get sick on the cruise if there is something wrong with her abdomen.  The patient reports that the abdominal pain is not made better or worse with anything.  There is no associated GI symptoms such as nausea vomiting black stools or bloody stools.  She also denies any change in bowel habits.  The abdominal pain is not made better or worse with eating or drinking.  She can go multiple days without having the pain and then all of a sudden the pain will come out of nowhere.  She states that the pain goes from under her rib downward towards her pelvis.  Past Medical History:  Diagnosis Date  . Allergy   . Asthma    mild intermittent due to allergies.  . Osteoarthritis   . Thyroid disease     Past Surgical History:  Procedure Laterality Date  . 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 BREAST BIOPSY Bilateral 1971   benign  . BREAST SURGERY    . COLONOSCOPY  03/19/2012   tubular adenoma - repeat 5 years Dr.  Vira Agar  . KNEE SURGERY     five different surgery    Prior to Admission medications   Medication Sig Start Date End Date Taking? Authorizing Provider  cetirizine (ZYRTEC) 10 MG tablet Take 1 tablet (10 mg total) by mouth daily. 07/06/17  Yes Glean Hess, MD  ferrous sulfate 325 (65 FE) MG tablet Take 325 mg by mouth daily with breakfast.   Yes [provider]  levothyroxine (SYNTHROID, LEVOTHROID) 75 MCG tablet TAKE 1 TABLET BY MOUTH EVERY DAY 07/19/17  Yes Glean Hess, MD  Multiple Vitamins-Minerals (MULTIPLE VITAMINS/WOMENS PO) Take by mouth.   Yes [provider]  SYMBICORT 160-4.5 MCG/ACT inhaler INHALE 2 PUFFS INTO THE LUNGS TWICE DAILY 05/26/18  Yes Glean Hess, MD  VENTOLIN HFA 108 (90 Base) MCG/ACT inhaler INHALE 2 PUFFS BY MOUTH EVERY 6 HOURS ASNEEDED SHORTNESS OF BREATH 05/26/18  Yes Glean Hess, MD  cyclobenzaprine (FLEXERIL) 5 MG tablet Take 1 tablet (5 mg total) by mouth 3 (three) times daily as needed for muscle spasms. 05/27/18   [name], MD    Family History  Problem Relation Age of Onset  . Depression Mother   . Hearing loss Mother   . Mental illness Mother   . Miscarriages / [place]  Mother   . Asthma Father   . Kidney disease Father   . Stroke Father   . Varicose Veins Father   . Breast cancer Neg Hx      Social History   Tobacco Use  . Smoking status: Current Some Day Smoker    Packs/day: 0.15    Types: Cigarettes  . Smokeless tobacco: Never Used  . Tobacco comment: 2-3 cigarettes daily  Substance Use Topics  . Alcohol use: No  . Drug use: No    Allergies as of 05/27/2018 - Review Complete 05/27/2018  Allergen Reaction Noted  . Aspirin Hives 11/28/2014  . Penicillins Other (See Comments) 11/28/2014  . Dairy aid [lactase] Diarrhea 04/02/2017    Review of Systems:    All systems reviewed and negative except where noted in HPI.   Physical Exam:  BP 115/76   Pulse 88   Ht 5\' 4"  (1.626 m)   Wt 121  lb 12.8 oz (55.2 kg)   BMI 20.91 kg/m  No LMP recorded. Patient is postmenopausal. General:   Alert,  Well-developed, well-nourished, pleasant and cooperative in NAD Head:  Normocephalic and atraumatic. Eyes:  Sclera clear, no icterus.   Conjunctiva pink. Ears:  Normal auditory acuity. Nose:  No deformity, discharge, or lesions. Mouth:  No deformity or lesions,oropharynx pink & moist. Neck:  Supple; no masses or thyromegaly. Lungs:  Respirations even and unlabored.  Clear throughout to auscultation.   No wheezes, crackles, or rhonchi. No acute distress. Heart:  Regular rate and rhythm; no murmurs, clicks, rubs, or gallops. Abdomen:  Normal bowel sounds.  No bruits.  Soft,  non-distended without masses, hepatosplenomegaly or hernias noted.  No guarding or rebound tenderness.  Positive Carnett sign.  There is clear exacerbation of the abdominal wall pain with flexing the abdominal wall muscles.  The patient reports that this reproducing of the pain with flexing the abdominal wall muscles is the reason she is here today. Rectal:  Deferred.  Msk:  Symmetrical without gross deformities.  Good, equal movement & strength bilaterally. Pulses:  Normal pulses noted. Extremities:  No clubbing or edema.  No cyanosis. Neurologic:  Alert and oriented x3;  grossly normal neurologically. Skin:  Intact without significant lesions or rashes.  No jaundice. Lymph Nodes:  No significant cervical adenopathy. Psych:  Alert and cooperative. Normal mood and affect.  Imaging Studies: Dg Chest 2 View  Result Date: 05/05/2018 CLINICAL DATA:  Rib cage pain for 10 days. EXAM: CHEST - 2 VIEW COMPARISON:  None. FINDINGS: The heart size and mediastinal contours are within normal limits. Both lungs are clear although somewhat hyperinflated. No effusions. Slight thoracolumbar scoliosis. No visible rib fractures. IMPRESSION: 1. No active cardiopulmonary disease. 2. No acute rib fracture or bone destruction. Electronically  Signed   By: Lorriane Shire M.D.   On: 05/05/2018 14:40   Mm 3d Screen Breast Bilateral  Result Date: 05/26/2018 CLINICAL DATA:  Screening. EXAM: DIGITAL SCREENING BILATERAL MAMMOGRAM WITH TOMO AND CAD COMPARISON:  Previous exam(s). ACR Breast Density Category c: The breast tissue is heterogeneously dense, which may obscure small masses. FINDINGS: There are no findings suspicious for malignancy. Images were processed with CAD. IMPRESSION: No mammographic evidence of malignancy. A result letter of this screening mammogram will be mailed directly to the patient. RECOMMENDATION: Screening mammogram in one year. (Code:SM-B-01Y) BI-RADS CATEGORY  1: Negative. Electronically Signed   By: Lajean Manes M.D.   On: 05/26/2018 10:13   US Abdomen Limited Ruq  Result Date: 05/10/2018 CLINICAL  DATA:  Right upper quadrant pain for several weeks. EXAM: ULTRASOUND ABDOMEN LIMITED RIGHT UPPER QUADRANT COMPARISON:  None. FINDINGS: Gallbladder: No gallstones or wall thickening visualized. No sonographic Murphy sign noted by sonographer. Common bile duct: Diameter: 2 mm, within normal limits. Liver: No focal lesion identified. Within normal limits in parenchymal echogenicity. Portal vein is patent on color Doppler imaging with normal direction of blood flow towards the liver. IMPRESSION: Negative.  No hepatobiliary abnormality identified. Electronically Signed   By: Earle Gell M.D.   On: 05/10/2018 08:59    Assessment and Plan:   Lindsey Golden is a 72 y.o. y/o female with clear musculoskeletal pain that is over the rectus abdominis muscle with a positive Carnett sign.  The patient states she is not able to take NSAIDs but does take Tylenol.  The report of the abdominal pain is not associated with any GI function such as eating drinking or moving her bowels.  The patient was clearly shown that her palpating her own abdominal wall while flexing the abdominal wall muscles reproduce the pain.  She has been told to  use warm compresses on the abdominal wall muscle and will be given a prescription for Flexeril.  The patient has been told that it can cause drowsiness.  The patient will follow-up as needed.  [name], MD. [facility]    Note: This dictation was prepared with Dragon dictation along with smaller phrase technology. Any transcriptional errors that result from this process are unintentional.

## 2018-06-11 ENCOUNTER — Telehealth: Payer: Self-pay

## 2018-06-11 ENCOUNTER — Other Ambulatory Visit: Payer: Self-pay | Admitting: Internal Medicine

## 2018-06-11 DIAGNOSIS — J4520 Mild intermittent asthma, uncomplicated: Secondary | ICD-10-CM

## 2018-06-11 DIAGNOSIS — E034 Atrophy of thyroid (acquired): Secondary | ICD-10-CM

## 2018-06-11 MED ORDER — LEVOTHYROXINE SODIUM 75 MCG PO TABS: 75.0000 ug | ORAL_TABLET | Freq: Every day | ORAL | 1 refills | Status: DC

## 2018-06-11 MED ORDER — ALBUTEROL SULFATE HFA 108 (90 BASE) MCG/ACT IN AERS: 2.0000 | INHALATION_SPRAY | Freq: Four times a day (QID) | RESPIRATORY_TRACT | 2 refills | Status: DC | PRN

## 2018-06-11 NOTE — Telephone Encounter (Signed)
Left VM needing refills though I do not see an asthma follow up for her in past 12 months or Thyroid visit. She wants Symbicort, Ventolin refill and Levothyroxine refill. [facility].Marland KitchenMarland KitchenMarland KitchenWas not sure if she needs OV or refills at this point given many visits are urgent care like visits .Marland Kitchen

## 2018-06-11 NOTE — Telephone Encounter (Signed)
She has refills on symbicort.  I will send refills on thyroid medication and albuterol inhaler.

## 2018-06-14 ENCOUNTER — Ambulatory Visit: Payer: Medicare Other | Admitting: Gastroenterology

## 2018-10-04 ENCOUNTER — Other Ambulatory Visit: Payer: Self-pay | Admitting: Internal Medicine

## 2018-10-04 DIAGNOSIS — J4520 Mild intermittent asthma, uncomplicated: Secondary | ICD-10-CM

## 2018-10-27 ENCOUNTER — Telehealth: Payer: Self-pay

## 2018-10-27 NOTE — Telephone Encounter (Signed)
Patient called requested for [place] to RF her Gabapentin because she said Dr. Melrose Golden would not RF her medicine without seeing her.  Called and informed patient because Dr. Melrose Golden is prescribing this, she will need to have this followed by him. If he wants her to come in, I told her she may have to otherwise she could try and request a telephone visit if they are doing this.  She verbalized understanding of this.

## 2018-11-02 ENCOUNTER — Ambulatory Visit (INDEPENDENT_AMBULATORY_CARE_PROVIDER_SITE_OTHER): Payer: Medicare Other | Admitting: Internal Medicine

## 2018-11-02 ENCOUNTER — Encounter: Payer: Self-pay | Admitting: Internal Medicine

## 2018-11-02 ENCOUNTER — Other Ambulatory Visit: Payer: Self-pay

## 2018-11-02 VITALS — BP 118/78 | HR 73 | Ht 64.0 in | Wt 117.0 lb

## 2018-11-02 DIAGNOSIS — E034 Atrophy of thyroid (acquired): Secondary | ICD-10-CM | POA: Insufficient documentation

## 2018-11-02 DIAGNOSIS — R600 Localized edema: Secondary | ICD-10-CM | POA: Diagnosis not present

## 2018-11-02 DIAGNOSIS — R202 Paresthesia of skin: Secondary | ICD-10-CM

## 2018-11-02 MED ORDER — HYDROCHLOROTHIAZIDE 25 MG PO TABS: 12.5000 mg | ORAL_TABLET | Freq: Every day | ORAL | 0 refills | Status: DC | PRN

## 2018-11-02 MED ORDER — GABAPENTIN 100 MG PO CAPS: 200.0000 mg | ORAL_CAPSULE | Freq: Two times a day (BID) | ORAL | 3 refills | Status: DC

## 2018-11-02 NOTE — Progress Notes (Signed)
Date:  11/02/2018   Name:  Lindsey Golden   DOB:  1945/07/08   MRN:  [id number]   Chief Complaint: Numbness (In hands and feet. Recurrent. Last seen Dr. Melrose Nakayama in October for this. Says she is no longer his doctor. Been off it for a week. Was on hold for 25 mins and could not get through. Gabapentin helped plenty. Does not want to see him again.  ); Tinnitus (X 2-3 months. Ringing in ears. ); and Edema (Years ago in [place] she was diagnosed with heat edema. Was given medication- fluid pill- to help get it off when its hot outside. Her feet and fingers get swollen. )  Tingling - in arms and legs that started after electrolyte abnormality from colon prep.  She was started on gabapentin twice a day and it has helped.  She does not want to go back to Neurology - she can never get through for an appointment and they wouldn't refill her medication.  Edema - she tends to get swelling in the heat - in both feet and fingers.  She has had diuretics as needed in the past.  This past week with the heat, she noticed some mild puffiness in her feet and her rings were tight.    Tinnitus - in both ears without hearing loss.  The sound is not bothersome but sounds like crickets.  She is not interested in an ENT evaluation at this time.  Thyroid - supplemented.  Lab Results  Component Value Date   TSH 0.775 02/16/2018    Review of Systems  Constitutional: Negative for chills, fatigue and fever.  HENT: Positive for tinnitus. Negative for hearing loss and trouble swallowing.   Respiratory: Negative for chest tightness, shortness of breath and wheezing.   Cardiovascular: Positive for leg swelling. Negative for chest pain and palpitations.  Genitourinary: Negative for difficulty urinating.  Musculoskeletal: Negative for gait problem and joint swelling.  Skin: Negative for rash.  Allergic/Immunologic: Negative for environmental allergies.  Neurological: Positive for numbness. Negative for  dizziness, weakness, light-headedness and headaches.  Psychiatric/Behavioral: Negative for dysphoric mood and sleep disturbance. The patient is not nervous/anxious.     Patient Active Problem List   Diagnosis Date Noted  . Tingling 03/25/2018  . Vitamin D deficiency 02/16/2018  . Tingling in extremities 02/16/2018  . Myalgia 02/02/2018  . Adenomatous colon polyp 10/07/2017  . 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 Seasonal allergies 05/13/2016  . Degenerative arthritis of knee, bilateral 11/26/2015  . Hypothyroidism 11/28/2014  . Asthma 11/28/2014    Allergies  Allergen Reactions  . Aspirin Hives  . Penicillins Other (See Comments)  . Dairy Aid [Lactase] Diarrhea    Past Surgical History:  Procedure Laterality Date  . 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 BREAST BIOPSY Bilateral 1971   benign  . BREAST SURGERY    . COLONOSCOPY  03/19/2012   tubular adenoma - repeat 5 years Dr. Vira Agar  . KNEE SURGERY     five different surgery    Social History   Tobacco Use  . Smoking status: Current Some Day Smoker    Packs/day: 0.15    Years: 40.00    Pack years: 6.00    Types: Cigarettes  . Smokeless tobacco: Never Used  . Tobacco comment: 2-3 cigarettes daily  Substance Use Topics  . Alcohol use: No  . Drug use: No     Medication list has  been reviewed and updated.  Current Meds  Medication Sig  . cetirizine (ZYRTEC) 10 MG tablet Take 1 tablet (10 mg total) by mouth daily.  . cyclobenzaprine (FLEXERIL) 5 MG tablet Take 1 tablet (5 mg total) by mouth 3 (three) times daily as needed for muscle spasms.  . ferrous sulfate 325 (65 FE) MG tablet Take 325 mg by mouth daily with breakfast.  . gabapentin (NEURONTIN) 100 MG capsule Take 200 capsules by mouth 2 (two) times daily. Dr. Melrose Nakayama- Neuro  . levothyroxine (SYNTHROID, LEVOTHROID) 75 MCG tablet Take 1 tablet (75 mcg total) by mouth daily.  . Multiple Vitamins-Minerals (MULTIPLE  VITAMINS/WOMENS PO) Take by mouth.  . SYMBICORT 160-4.5 MCG/ACT inhaler INHALE 2 PUFFS INTO THE LUNGS TWICE DAILY  . VENTOLIN HFA 108 (90 Base) MCG/ACT inhaler INHALE 2 PUFFS BY MOUTH EVERY 6 HOURS ASNEEDED WHEEZING/ SHORTNESS OFBREATH    PHQ 2/9 Scores 11/02/2018 04/21/2018 10/07/2017 04/02/2017  PHQ - 2 Score 0 0 0 0    BP Readings from Last 3 Encounters:  11/02/18 118/78  05/27/18 115/76  05/05/18 124/78    Physical Exam Vitals signs and nursing note reviewed.  Constitutional:      General: She is not in acute distress.    Appearance: She is well-developed.  HENT:     Head: Normocephalic and atraumatic.     Right Ear: Tympanic membrane, ear canal and external ear normal.     Left Ear: External ear normal. There is impacted cerumen.  Eyes:     Pupils: Pupils are equal, round, and reactive to light.  Neck:     Musculoskeletal: Normal range of motion and neck supple.  Cardiovascular:     Rate and Rhythm: Normal rate and regular rhythm.     Pulses:          Radial pulses are 2+ on the right side and 2+ on the left side.       Dorsalis pedis pulses are 2+ on the right side.       Posterior tibial pulses are 2+ on the right side and 2+ on the left side.     Heart sounds: Normal heart sounds.  Pulmonary:     Effort: Pulmonary effort is normal. No respiratory distress.     Breath sounds: Normal breath sounds.  Musculoskeletal: Normal range of motion.     Right lower leg: No edema.     Left lower leg: No edema.  Lymphadenopathy:     Cervical: No cervical adenopathy.  Skin:    General: Skin is warm and dry.     Findings: No rash.  Neurological:     Mental Status: She is alert and oriented to person, place, and time.     Sensory: Sensation is intact.     Motor: Motor function is intact.     Coordination: Coordination is intact.     Gait: Gait is intact.  Psychiatric:        Behavior: Behavior normal.        Thought Content: Thought content normal.     Wt Readings from  Last 3 Encounters:  11/02/18 117 lb (53.1 kg)  05/27/18 121 lb 12.8 oz (55.2 kg)  05/05/18 118 lb (53.5 kg)    BP 118/78   Pulse 73   Ht 5\' 4"  (1.626 m)   Wt 117 lb (53.1 kg)   SpO2 97%   BMI 20.08 kg/m   Assessment and Plan: 1. Tingling in extremities Continue gabapentin - gabapentin (NEURONTIN) 100 MG  capsule; Take 2 capsules (200 mg total) by mouth 2 (two) times daily. Dr. Melrose Nakayama- Neuro  Dispense: 360 capsule; Refill: 3  2. Localized edema Use HCTZ as needed only - hydrochlorothiazide (HYDRODIURIL) 25 MG tablet; Take 0.5 tablets (12.5 mg total) by mouth daily as needed.  Dispense: 30 tablet; Refill: 0  3. Hypothyroidism due to acquired atrophy of thyroid supplemented   Partially dictated using Editor, commissioning. Any errors are unintentional.  Halina Maidens, MD [facility]  11/02/2018

## 2018-12-01 DIAGNOSIS — L570 Actinic keratosis: Secondary | ICD-10-CM | POA: Diagnosis not present

## 2018-12-01 DIAGNOSIS — D3610 Benign neoplasm of peripheral nerves and autonomic nervous system, unspecified: Secondary | ICD-10-CM | POA: Diagnosis not present

## 2018-12-01 DIAGNOSIS — L814 Other melanin hyperpigmentation: Secondary | ICD-10-CM | POA: Diagnosis not present

## 2018-12-01 DIAGNOSIS — L821 Other seborrheic keratosis: Secondary | ICD-10-CM | POA: Diagnosis not present

## 2018-12-31 ENCOUNTER — Other Ambulatory Visit: Payer: Self-pay | Admitting: Internal Medicine

## 2018-12-31 DIAGNOSIS — J4520 Mild intermittent asthma, uncomplicated: Secondary | ICD-10-CM

## 2018-12-31 DIAGNOSIS — R600 Localized edema: Secondary | ICD-10-CM

## 2019-01-09 IMAGING — US US ABDOMEN LIMITED
1 series · 14 of 25 positions shown · non-contrast
Comparison: None.

CLINICAL DATA: Right upper quadrant pain for several weeks.

EXAM:
ULTRASOUND ABDOMEN LIMITED RIGHT UPPER QUADRANT

[Series 1: us abdomen limited · 0.17mm/px · 14 of 54 slices shown]
[im 1/54]
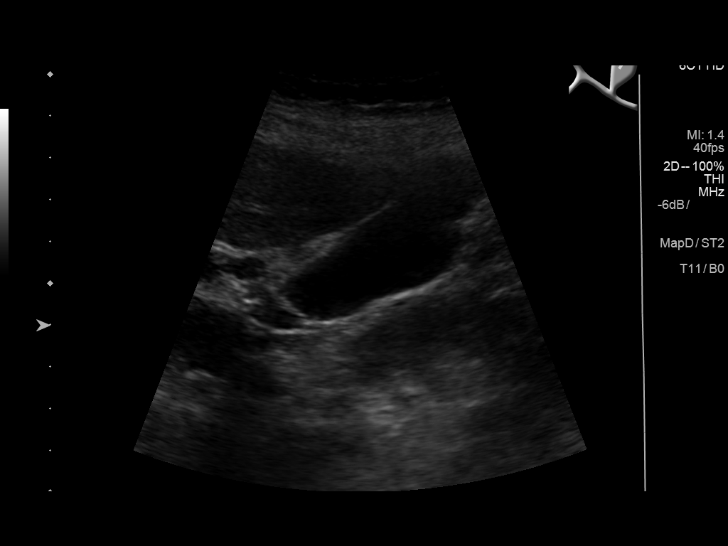
[im 5/54]
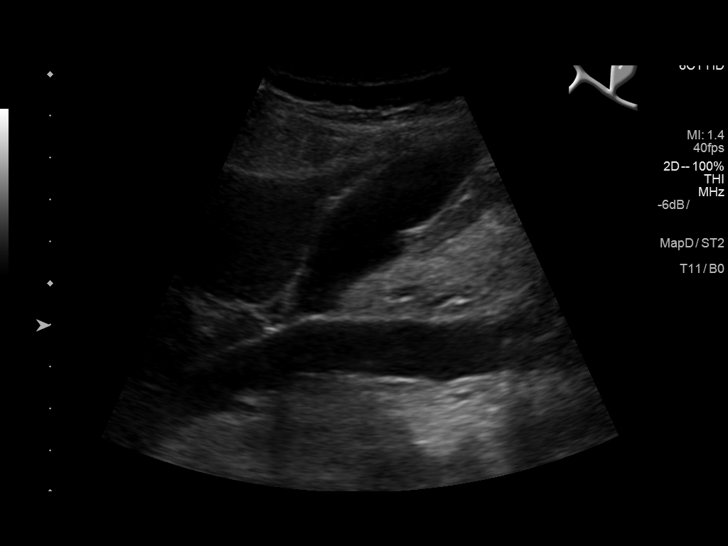
[im 9/54]
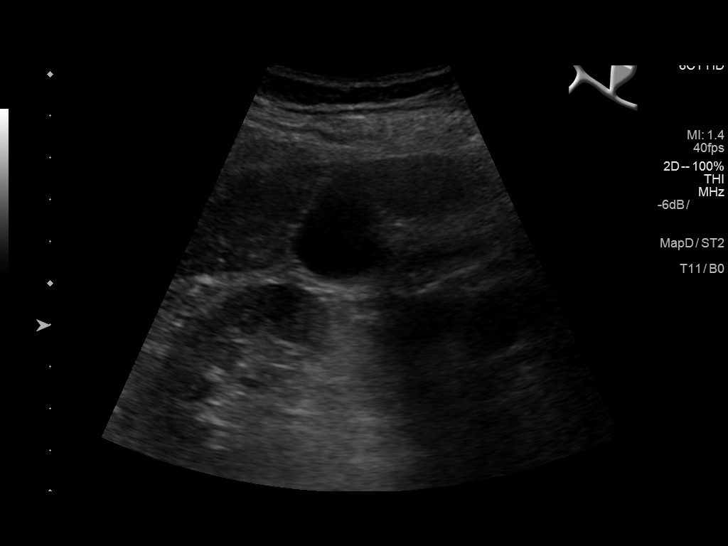
[im 14/54]
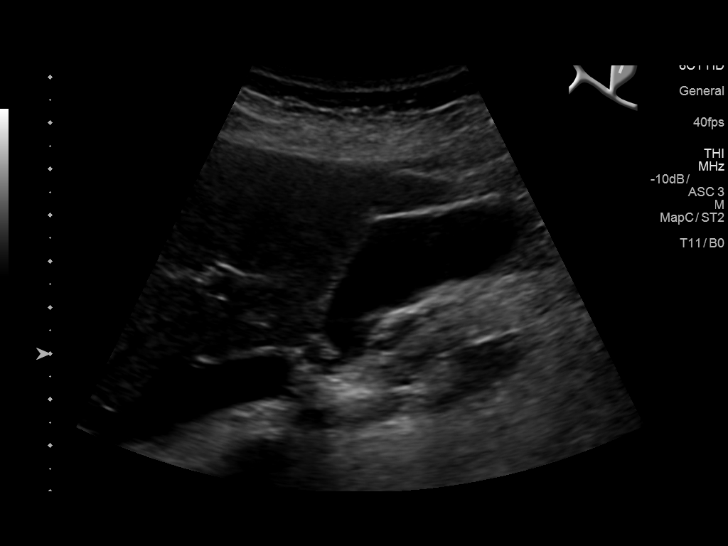
[im 18/54]
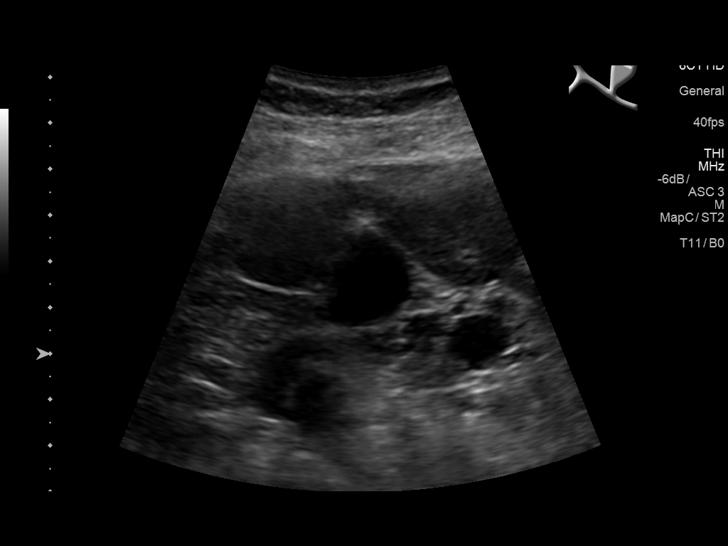
[im 20/54]
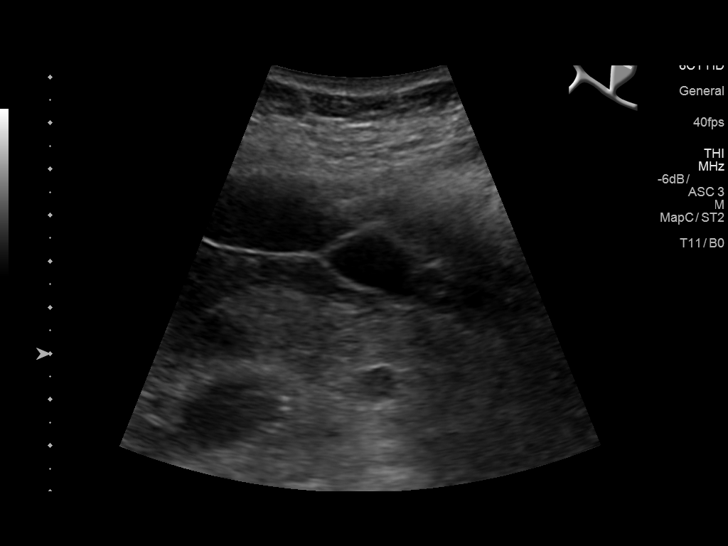
[im 25/54]
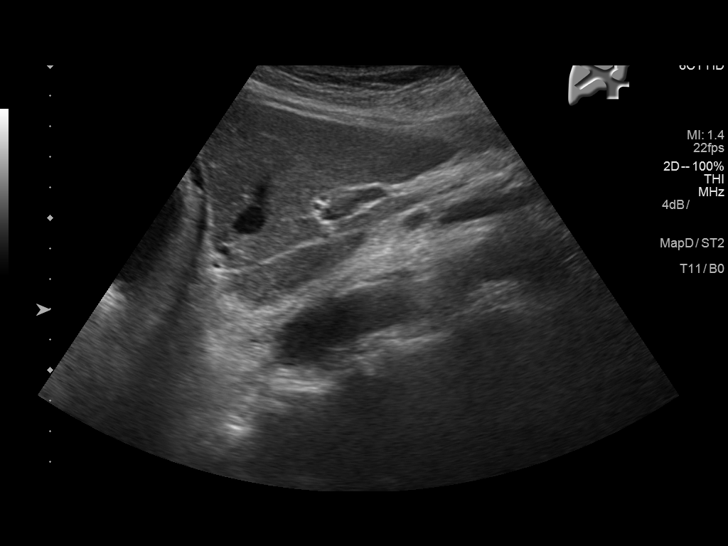
[im 29/54]
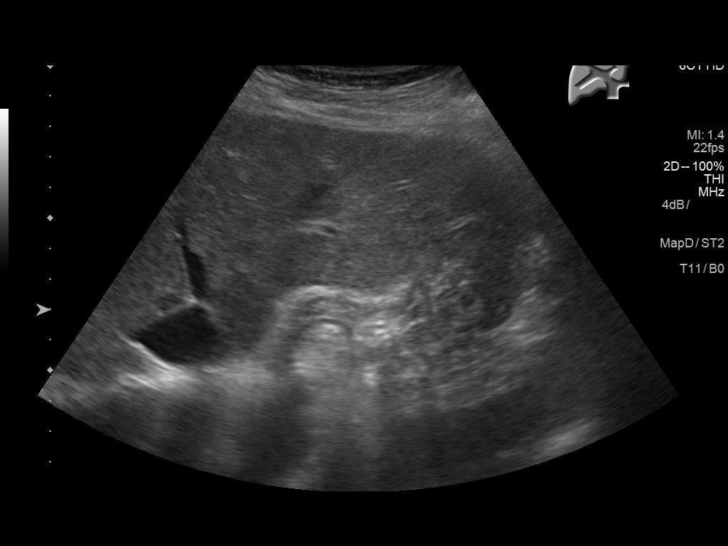
[im 34/54]
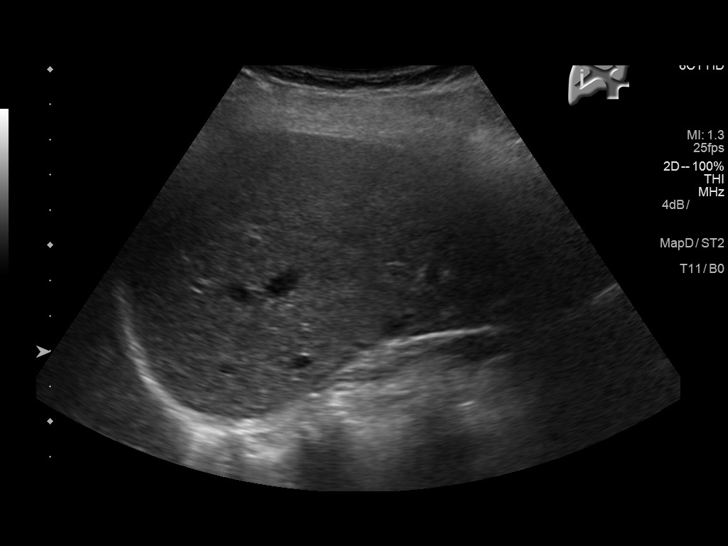
[im 36/54]
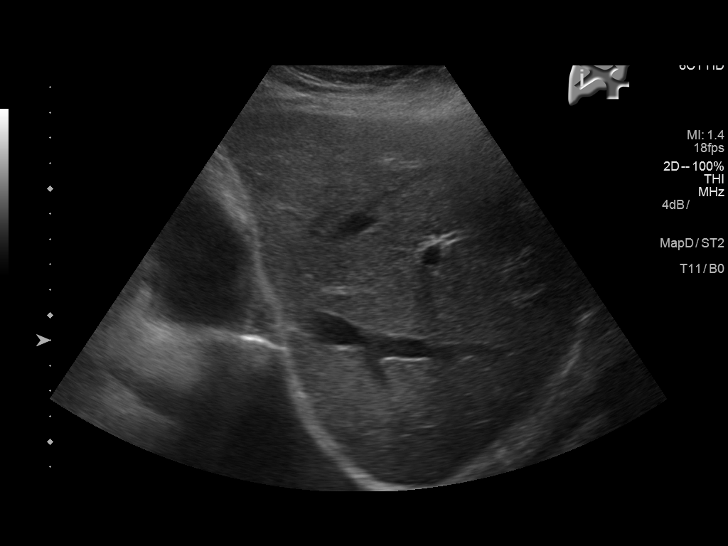
[im 40/54]
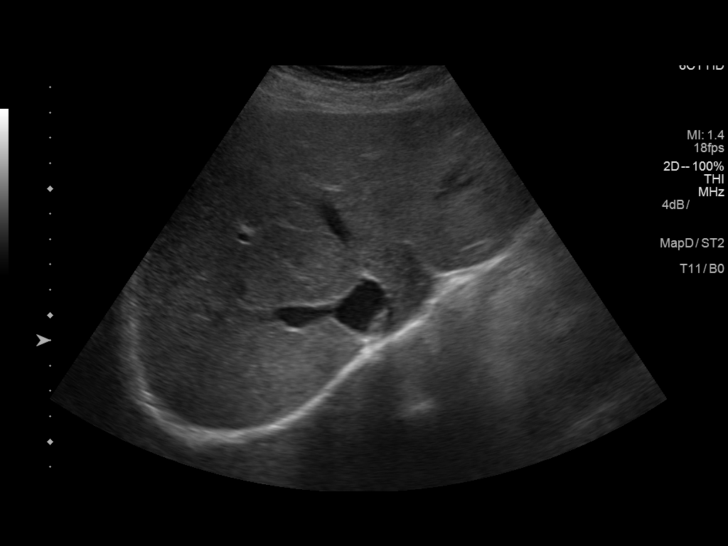
[im 45/54]
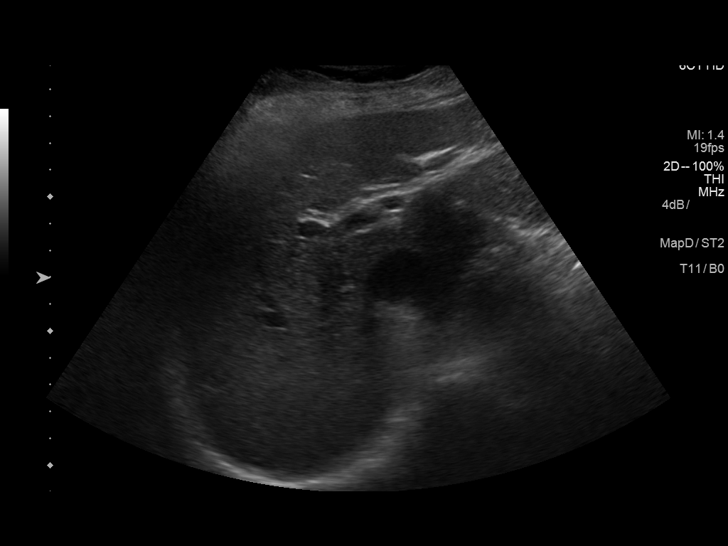
[im 49/54]
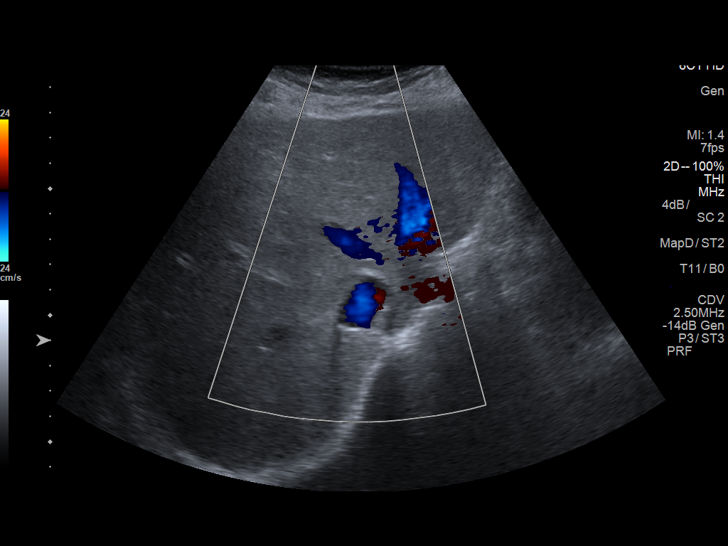
[im 54/54]
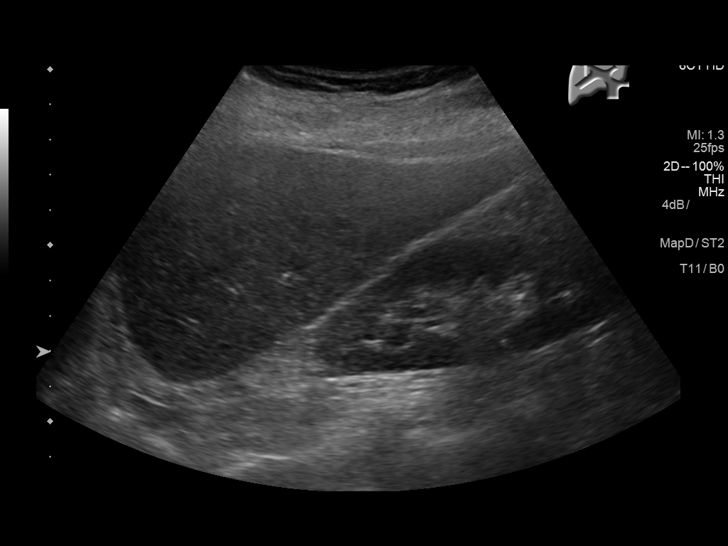

[14 of 25 positions shown; findings below may reference images not displayed]

FINDINGS: Gallbladder:

No gallstones or wall thickening visualized. No sonographic Murphy
sign noted by sonographer.

Common bile duct:

Diameter: 2 mm, within normal limits.

Liver:

No focal lesion identified. Within normal limits in parenchymal
echogenicity. Portal vein is patent on color Doppler imaging with
normal direction of blood flow towards the liver.
IMPRESSION: Negative.  No hepatobiliary abnormality identified.

## 2019-01-23 ENCOUNTER — Other Ambulatory Visit: Payer: Self-pay | Admitting: Internal Medicine

## 2019-01-23 DIAGNOSIS — E034 Atrophy of thyroid (acquired): Secondary | ICD-10-CM

## 2019-02-18 ENCOUNTER — Other Ambulatory Visit: Payer: Self-pay | Admitting: Internal Medicine

## 2019-02-18 DIAGNOSIS — J4520 Mild intermittent asthma, uncomplicated: Secondary | ICD-10-CM

## 2019-03-02 ENCOUNTER — Encounter: Payer: Self-pay | Admitting: Unknown Physician Specialty

## 2019-03-18 DIAGNOSIS — Z23 Encounter for immunization: Secondary | ICD-10-CM | POA: Diagnosis not present

## 2019-04-06 ENCOUNTER — Other Ambulatory Visit: Payer: Self-pay | Admitting: Internal Medicine

## 2019-04-06 DIAGNOSIS — Z1231 Encounter for screening mammogram for malignant neoplasm of breast: Secondary | ICD-10-CM

## 2019-04-18 ENCOUNTER — Encounter: Payer: Self-pay | Admitting: Internal Medicine

## 2019-04-18 ENCOUNTER — Other Ambulatory Visit: Payer: Self-pay

## 2019-04-18 ENCOUNTER — Ambulatory Visit (INDEPENDENT_AMBULATORY_CARE_PROVIDER_SITE_OTHER): Payer: Medicare Other | Admitting: Internal Medicine

## 2019-04-18 VITALS — BP 112/72 | HR 93 | Ht 64.0 in | Wt 117.0 lb

## 2019-04-18 DIAGNOSIS — N6019 Diffuse cystic mastopathy of unspecified breast: Secondary | ICD-10-CM | POA: Diagnosis not present

## 2019-04-18 DIAGNOSIS — S86911A Strain of unspecified muscle(s) and tendon(s) at lower leg level, right leg, initial encounter: Secondary | ICD-10-CM | POA: Diagnosis not present

## 2019-04-18 NOTE — Progress Notes (Signed)
Date:  04/18/2019   Name:  Lindsey Golden   DOB:  1945-12-15   MRN:  [id number]   Chief Complaint: Breast Pain (Right breast pain. Started right before halloween. Off and on for years. Postmenopausal. Getting worse. No discharge or lumps. Mammogram scheduled at the end of December. Wants to know if she can have one sooner. ) and Knee Pain (Right knee pain. Missed stair outside on front porch and stopped her foot down on concrete, now having pain right under right knee.)  Knee Pain  The incident occurred more than 1 week ago. The incident occurred at home. The injury mechanism was a direct blow (stepped down 2 rather than 1 step at home. ). The pain is present in the right knee. The quality of the pain is described as aching. The pain is mild. The pain has been intermittent since onset. She has tried acetaminophen for the symptoms. The treatment provided significant relief.  Breast pain - She has had breast discomfort off and on for years.  Remote history of biopsy.  Over the past few months she has had intermittent right sided discomfort.  There has been no mass, nipple discharge, skin change or injury.  She has a mammogram scheduled in December.  @ Lab Results  Component Value Date   CREATININE 0.61 05/05/2018   BUN 14 05/05/2018   NA 137 05/05/2018   K 4.1 05/05/2018   CL 104 05/05/2018   CO2 25 05/05/2018   @ Lab Results  Component Value Date   CHOL 197 10/07/2017   HDL 89 10/07/2017   LDLCALC 87 10/07/2017   TRIG 105 10/07/2017   CHOLHDL 2.2 10/07/2017   @ Lab Results  Component Value Date   TSH 0.775 02/16/2018   @ Lab Results  Component Value Date   HGBA1C 5.0 09/02/2016     Review of Systems  Constitutional: Negative for chills, fatigue and fever.  Respiratory: Negative for chest tightness and shortness of breath.   Cardiovascular: Negative for chest pain, palpitations and leg swelling.  Musculoskeletal: Positive for arthralgias. Negative for gait  problem, joint swelling and myalgias.  Neurological: Negative for dizziness and headaches.    Patient Active Problem List   Diagnosis Date Noted  . Hypothyroidism due to acquired atrophy of thyroid 11/02/2018  . Tingling 03/25/2018  . Vitamin D deficiency 02/16/2018  . Tingling in extremities 02/16/2018  . Myalgia 02/02/2018  . Adenomatous colon polyp 10/07/2017  . 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 Seasonal allergies 05/13/2016  . Degenerative arthritis of knee, bilateral 11/26/2015  . Asthma 11/28/2014    Allergies  Allergen Reactions  . Aspirin Hives  . Penicillins Other (See Comments)  . Dairy Aid [Lactase] Diarrhea    Past Surgical History:  Procedure Laterality Date  . 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 BREAST BIOPSY Bilateral 1971   benign  . BREAST SURGERY    . COLONOSCOPY  03/19/2012   tubular adenoma - repeat 5 years Dr. Vira Agar  . KNEE SURGERY     five different surgery    Social History   Tobacco Use  . Smoking status: Current Some Day Smoker    Packs/day: 0.15    Years: 40.00    Pack years: 6.00    Types: Cigarettes  . Smokeless tobacco: Never Used  . Tobacco comment: 2-3 cigarettes daily  Substance Use Topics  . Alcohol use: No  . Drug use: No  Medication list has been reviewed and updated.  Current Meds  Medication Sig  . albuterol (VENTOLIN HFA) 108 (90 Base) MCG/ACT inhaler INHALE 2 PUFFS BY MOUTH EVERY 6 HOURS ASNEEDED WHEEZING/ SHORTNESS OFBREATH  . cetirizine (ZYRTEC) 10 MG tablet Take 1 tablet (10 mg total) by mouth daily.  . cyclobenzaprine (FLEXERIL) 5 MG tablet Take 1 tablet (5 mg total) by mouth 3 (three) times daily as needed for muscle spasms.  . ferrous sulfate 325 (65 FE) MG tablet Take 325 mg by mouth daily with breakfast.  . gabapentin (NEURONTIN) 100 MG capsule Take 2 capsules (200 mg total) by mouth 2 (two) times daily. Dr. Melrose Nakayama- Neuro  . hydrochlorothiazide  (HYDRODIURIL) 25 MG tablet TAKE 1/2 TABLET BY MOUTH ONCE DAILY AS NEEDED  . levothyroxine (SYNTHROID) 75 MCG tablet TAKE 1 TABLET BY MOUTH ONCE DAILY ON AN EMPTY STOMACH. WAIT 30 MINUTES BEFORE TAKING OTHER MEDS.  . Multiple Vitamins-Minerals (MULTIPLE VITAMINS/WOMENS PO) Take by mouth.  . SYMBICORT 160-4.5 MCG/ACT inhaler INHALE 2 PUFFS BY MOUTH TWICE DAILY    PHQ 2/9 Scores 04/18/2019 11/02/2018 04/21/2018 10/07/2017  PHQ - 2 Score 1 0 0 0    BP Readings from Last 3 Encounters:  04/18/19 112/72  11/02/18 118/78  05/27/18 115/76    Physical Exam Vitals signs and nursing note reviewed.  Constitutional:      General: She is not in acute distress.    Appearance: Normal appearance. She is well-developed.  HENT:     Head: Normocephalic and atraumatic.  Neck:     Musculoskeletal: Normal range of motion.  Cardiovascular:     Rate and Rhythm: Normal rate and regular rhythm.  Pulmonary:     Effort: Pulmonary effort is normal. No respiratory distress.     Breath sounds: No wheezing or rhonchi.  Chest:     Chest wall: No tenderness or edema.     Breasts:        Right: No inverted nipple, mass, nipple discharge, skin change or tenderness.        Left: No inverted nipple, mass, nipple discharge, skin change or tenderness.  Musculoskeletal: Normal range of motion.     Right knee: She exhibits normal range of motion, no swelling, no effusion, no ecchymosis and no erythema. Tenderness found. Lateral joint line tenderness noted.     Right lower leg: No edema.     Left lower leg: No edema.     Comments: No instability, edema, posterior fullness  Lymphadenopathy:     Cervical: No cervical adenopathy.  Skin:    General: Skin is warm and dry.     Findings: No rash.  Neurological:     Mental Status: She is alert and oriented to person, place, and time.  Psychiatric:        Attention and Perception: Attention normal.        Mood and Affect: Mood normal.        Behavior: Behavior normal.         Thought Content: Thought content normal.     Wt Readings from Last 3 Encounters:  04/18/19 117 lb (53.1 kg)  11/02/18 117 lb (53.1 kg)  05/27/18 121 lb 12.8 oz (55.2 kg)    BP 112/72   Pulse 93   Ht 5\' 4"  (1.626 m)   Wt 117 lb (53.1 kg)   SpO2 96%   BMI 20.08 kg/m   Assessment and Plan: 1. Strain of right knee, initial encounter Continue tylenol as needed Suspect mild strain  without significant injury If persistent - will refer to Orthopedics  2. Fibrocystic breast disease (FCBD), unspecified laterality Normal exam Proceed with scheduled screening mammogram Tylenol bid as needed Vitamin E daily   Partially dictated using Editor, commissioning. Any errors are unintentional.  Halina Maidens, MD [facility]  04/18/2019

## 2019-04-18 NOTE — Patient Instructions (Signed)
Start Vitamin E 200-400 units daily  Tylenol 1-2 twice a day

## 2019-04-19 ENCOUNTER — Other Ambulatory Visit: Payer: Self-pay | Admitting: Internal Medicine

## 2019-04-19 DIAGNOSIS — E034 Atrophy of thyroid (acquired): Secondary | ICD-10-CM

## 2019-04-25 ENCOUNTER — Ambulatory Visit (INDEPENDENT_AMBULATORY_CARE_PROVIDER_SITE_OTHER): Payer: Medicare Other

## 2019-04-25 ENCOUNTER — Other Ambulatory Visit: Payer: Self-pay

## 2019-04-25 VITALS — BP 92/62 | HR 68 | Temp 97.0°F | Resp 16 | Ht 64.0 in | Wt 117.0 lb

## 2019-04-25 DIAGNOSIS — Z0000 Encounter for general adult medical examination without abnormal findings: Secondary | ICD-10-CM

## 2019-04-25 DIAGNOSIS — Z780 Asymptomatic menopausal state: Secondary | ICD-10-CM | POA: Diagnosis not present

## 2019-04-25 NOTE — Patient Instructions (Signed)
Lindsey Golden , Thank you for taking time to come for your Medicare Wellness Visit. I appreciate your ongoing commitment to your health goals. Please review the following plan we discussed and let me know if I can assist you in the future.   Screening recommendations/referrals: Colonoscopy: done 01/19/18. Repeat in 2024. Mammogram: done 05/26/18. Scheduled for 05/30/19 Bone Density: done 2010. Please call [phone] to schedule your bone density screening.  Recommended yearly ophthalmology/optometry visit for glaucoma screening and checkup Recommended yearly dental visit for hygiene and checkup  Vaccinations: Influenza vaccine: done 03/24/19 Pneumococcal vaccine: done 03/08/14 Tdap vaccine: done 09/29/14 Shingles vaccine: done 12/14/18    Advanced directives: Advance directive discussed with you today. Even though you declined this today please call our office should you change your mind and we can give you the proper paperwork for you to fill out.  Conditions/risks identified: If you wish to quit smoking, help is available. For free tobacco cessation program offerings call the [facility] at [phone] or Live Well Line at (714) 623-4834. You may also visit [email or web address] or email [email or web address] for more information on other programs.   Next appointment: Please follow up in one year for your Medicare Annual Wellness visit.     Preventive Care 71 Years and Older, Female Preventive care refers to lifestyle choices and visits with your health care provider that can promote health and wellness. What does preventive care include?  A yearly physical exam. This is also called an annual well check.  Dental exams once or twice a year.  Routine eye exams. Ask your health care provider how often you should have your eyes checked.  Personal lifestyle choices, including:  Daily care of your teeth and gums.  Regular physical activity.  Eating a healthy diet.   Avoiding tobacco and drug use.  Limiting alcohol use.  Practicing safe sex.  Taking low-dose aspirin every day.  Taking vitamin and mineral supplements as recommended by your health care provider. What happens during an annual well check? The services and screenings done by your health care provider during your annual well check will depend on your age, overall health, lifestyle risk factors, and family history of disease. Counseling  Your health care provider may ask you questions about your:  Alcohol use.  Tobacco use.  Drug use.  Emotional well-being.  Home and relationship well-being.  Sexual activity.  Eating habits.  History of falls.  Memory and ability to understand (cognition).  Work and work Statistician.  Reproductive health. Screening  You may have the following tests or measurements:  Height, weight, and BMI.  Blood pressure.  Lipid and cholesterol levels. These may be checked every 5 years, or more frequently if you are over 38 years old.  Skin check.  Lung cancer screening. You may have this screening every year starting at age 71 if you have a 30-pack-year history of smoking and currently smoke or have quit within the past 15 years.  Fecal occult blood test (FOBT) of the stool. You may have this test every year starting at age 40.  Flexible sigmoidoscopy or colonoscopy. You may have a sigmoidoscopy every 5 years or a colonoscopy every 10 years starting at age 30.  Hepatitis C blood test.  Hepatitis B blood test.  Sexually transmitted disease (STD) testing.  Diabetes screening. This is done by checking your blood sugar (glucose) after you have not eaten for a while (fasting). You may have this done every 1-3 years.  Bone density scan.  This is done to screen for osteoporosis. You may have this done starting at age 71.  Mammogram. This may be done every 1-2 years. Talk to your health care provider about how often you should have regular  mammograms. Talk with your health care provider about your test results, treatment options, and if necessary, the need for more tests. Vaccines  Your health care provider may recommend certain vaccines, such as:  Influenza vaccine. This is recommended every year.  Tetanus, diphtheria, and acellular pertussis (Tdap, Td) vaccine. You may need a Td booster every 10 years.  Zoster vaccine. You may need this after age 73.  Pneumococcal 13-valent conjugate (PCV13) vaccine. One dose is recommended after age 20.  Pneumococcal polysaccharide (PPSV23) vaccine. One dose is recommended after age 47. Talk to your health care provider about which screenings and vaccines you need and how often you need them. This information is not intended to replace advice given to you by your health care provider. Make sure you discuss any questions you have with your health care provider. Document Released: 06/22/2015 Document Revised: 02/13/2016 Document Reviewed: 03/27/2015 Elsevier Interactive Patient Education  2017 [employer] Prevention in the Home Falls can cause injuries. They can happen to people of all ages. There are many things you can do to make your home safe and to help prevent falls. What can I do on the outside of my home?  Regularly fix the edges of walkways and driveways and fix any cracks.  Remove anything that might make you trip as you walk through a door, such as a raised step or threshold.  Trim any bushes or trees on the path to your home.  Use bright outdoor lighting.  Clear any walking paths of anything that might make someone trip, such as rocks or tools.  Regularly check to see if handrails are loose or broken. Make sure that both sides of any steps have handrails.  Any raised decks and porches should have guardrails on the edges.  Have any leaves, snow, or ice cleared regularly.  Use sand or salt on walking paths during winter.  Clean up any spills in your garage  right away. This includes oil or grease spills. What can I do in the bathroom?  Use night lights.  Install grab bars by the toilet and in the tub and shower. Do not use towel bars as grab bars.  Use non-skid mats or decals in the tub or shower.  If you need to sit down in the shower, use a plastic, non-slip stool.  Keep the floor dry. Clean up any water that spills on the floor as soon as it happens.  Remove soap buildup in the tub or shower regularly.  Attach bath mats securely with double-sided non-slip rug tape.  Do not have throw rugs and other things on the floor that can make you trip. What can I do in the bedroom?  Use night lights.  Make sure that you have a light by your bed that is easy to reach.  Do not use any sheets or blankets that are too big for your bed. They should not hang down onto the floor.  Have a firm chair that has side arms. You can use this for support while you get dressed.  Do not have throw rugs and other things on the floor that can make you trip. What can I do in the kitchen?  Clean up any spills right away.  Avoid walking on wet floors.  Keep items that you use a lot in easy-to-reach places.  If you need to reach something above you, use a strong step stool that has a grab bar.  Keep electrical cords out of the way.  Do not use floor polish or wax that makes floors slippery. If you must use wax, use non-skid floor wax.  Do not have throw rugs and other things on the floor that can make you trip. What can I do with my stairs?  Do not leave any items on the stairs.  Make sure that there are handrails on both sides of the stairs and use them. Fix handrails that are broken or loose. Make sure that handrails are as long as the stairways.  Check any carpeting to make sure that it is firmly attached to the stairs. Fix any carpet that is loose or worn.  Avoid having throw rugs at the top or bottom of the stairs. If you do have throw rugs,  attach them to the floor with carpet tape.  Make sure that you have a light switch at the top of the stairs and the bottom of the stairs. If you do not have them, ask someone to add them for you. What else can I do to help prevent falls?  Wear shoes that:  Do not have high heels.  Have rubber bottoms.  Are comfortable and fit you well.  Are closed at the toe. Do not wear sandals.  If you use a stepladder:  Make sure that it is fully opened. Do not climb a closed stepladder.  Make sure that both sides of the stepladder are locked into place.  Ask someone to hold it for you, if possible.  Clearly mark and make sure that you can see:  Any grab bars or handrails.  First and last steps.  Where the edge of each step is.  Use tools that help you move around (mobility aids) if they are needed. These include:  Canes.  Walkers.  Scooters.  Crutches.  Turn on the lights when you go into a dark area. Replace any light bulbs as soon as they burn out.  Set up your furniture so you have a clear path. Avoid moving your furniture around.  If any of your floors are uneven, fix them.  If there are any pets around you, be aware of where they are.  Review your medicines with your doctor. Some medicines can make you feel dizzy. This can increase your chance of falling. Ask your doctor what other things that you can do to help prevent falls. This information is not intended to replace advice given to you by your health care provider. Make sure you discuss any questions you have with your health care provider. Document Released: 03/22/2009 Document Revised: 11/01/2015 Document Reviewed: 06/30/2014 Elsevier Interactive Patient Education  2017 [employer].

## 2019-04-25 NOTE — Progress Notes (Signed)
Subjective:   Lindsey Golden is a 73 y.o. female who presents for Medicare Annual (Subsequent) preventive examination.  Review of Systems:   Cardiac Risk Factors include: advanced age (>28men, >45 women)     Objective:     Vitals: BP 92/62 (BP Location: Right Arm, Patient Position: Sitting, Cuff Size: Normal)   Pulse 68   Temp (!) 97 F (36.1 C) (Temporal)   Resp 16   Ht 5\' 4"  (1.626 m)   Wt 117 lb (53.1 kg)   SpO2 98%   BMI 20.08 kg/m   Body mass index is 20.08 kg/m.  Advanced Directives 04/25/2019 04/21/2018 04/02/2017  Does Patient Have a Medical Advance Directive? No Yes Yes  Type of Advance Directive - [place];Living will [place];Living will  Does patient want to make changes to medical advance directive? No - Patient declined - -  Copy of [place] in Chart? - No - copy requested No - copy requested    Tobacco Social History   Tobacco Use  Smoking Status Current Some Day Smoker  . Packs/day: 0.15  . Years: 40.00  . Pack years: 6.00  . Types: Cigarettes  Smokeless Tobacco Never Used  Tobacco Comment   2-3 cigarettes daily     Ready to quit: Not Answered Counseling given: Not Answered Comment: 2-3 cigarettes daily   Clinical Intake:  Pre-visit preparation completed: Yes  Pain : No/denies pain     BMI - recorded: 20.08 Nutritional Status: BMI of 19-24  Normal Nutritional Risks: None Diabetes: No  How often do you need to have someone help you when you read instructions, pamphlets, or other written materials from your doctor or pharmacy?: 1 - Never  Interpreter Needed?: No  Information entered by :: Clemetine Marker LPN  Past Medical History:  Diagnosis Date  . Allergy   . Asthma    mild intermittent due to allergies.  . Osteoarthritis   . Thyroid disease    Past Surgical History:  Procedure Laterality Date  . 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 BREAST  BIOPSY Bilateral 1971   benign  . BREAST SURGERY    . COLONOSCOPY  03/19/2012   tubular adenoma - repeat 5 years Dr. Vira Agar  . KNEE SURGERY     five different surgery   Family History  Problem Relation Age of Onset  . Depression Mother   . Hearing loss Mother   . Mental illness Mother   . Miscarriages / [place] Mother   . Asthma Father   . Kidney disease Father   . Stroke Father   . Varicose Veins Father   . Breast cancer Neg Hx    Social History   Socioeconomic History  . Marital status: Married    Spouse name: Not on file  . Number of children: 0  . Years of education: Not on file  . Highest education level: Some college, no degree  Occupational History  . Occupation: retired  Scientific laboratory technician  . Financial resource strain: Not hard at all  . Food insecurity    Worry: Never true    Inability: Never true  . Transportation needs    Medical: No    Non-medical: No  Tobacco Use  . Smoking status: Current Some Day Smoker    Packs/day: 0.15    Years: 40.00    Pack years: 6.00    Types: Cigarettes  . Smokeless tobacco: Never Used  .  Tobacco comment: 2-3 cigarettes daily  Substance and Sexual Activity  . Alcohol use: No  . Drug use: No  . Sexual activity: Never  Lifestyle  . Physical activity    Days per week: 7 days    Minutes per session: 10 min  . Stress: Not at all  Relationships  . Social connections    Talks on phone: More than three times a week    Gets together: More than three times a week    Attends religious service: Never    Active member of club or organization: No    Attends meetings of clubs or organizations: Never    Relationship status: Married  Other Topics Concern  . Not on file  Social History Narrative  . Not on file    Outpatient Encounter Medications as of 04/25/2019  Medication Sig  . albuterol (VENTOLIN HFA) 108 (90 Base) MCG/ACT inhaler INHALE 2 PUFFS BY MOUTH EVERY 6 HOURS ASNEEDED WHEEZING/ SHORTNESS OFBREATH  . cetirizine  (ZYRTEC) 10 MG tablet Take 1 tablet (10 mg total) by mouth daily.  . cyclobenzaprine (FLEXERIL) 5 MG tablet Take 1 tablet (5 mg total) by mouth 3 (three) times daily as needed for muscle spasms.  . ferrous sulfate 325 (65 FE) MG tablet Take 325 mg by mouth daily with breakfast.  . gabapentin (NEURONTIN) 100 MG capsule Take 2 capsules (200 mg total) by mouth 2 (two) times daily. Dr. Melrose Nakayama- Neuro  . hydrochlorothiazide (HYDRODIURIL) 25 MG tablet TAKE 1/2 TABLET BY MOUTH ONCE DAILY AS NEEDED  . levothyroxine (SYNTHROID) 75 MCG tablet TAKE 1 TABLET BY MOUTH ONCE DAILY ON AN EMPTY STOMACH. WAIT 30 MINUTES BEFORE TAKING OTHER MEDS.  . Multiple Vitamins-Minerals (MULTIPLE VITAMINS/WOMENS PO) Take by mouth.  . SYMBICORT 160-4.5 MCG/ACT inhaler INHALE 2 PUFFS BY MOUTH TWICE DAILY   No facility-administered encounter medications on file as of 04/25/2019.     Activities of Daily Living In your present state of health, do you have any difficulty performing the following activities: 04/25/2019  Hearing? N  Comment declines hearing aids  Vision? N  Difficulty concentrating or making decisions? N  Walking or climbing stairs? N  Dressing or bathing? N  Doing errands, shopping? N  Preparing Food and eating ? N  Using the Toilet? N  In the past six months, have you accidently leaked urine? N  Do you have problems with loss of bowel control? N  Managing your Medications? N  Managing your Finances? N  Housekeeping or managing your Housekeeping? N  Some recent data might be hidden    Patient Care Team: Glean Hess, MD as PCP - General (Internal Medicine)    Assessment:   This is a routine wellness examination for [place].  Exercise Activities and Dietary recommendations Current Exercise Habits: Home exercise routine, Type of exercise: stretching;walking, Time (Minutes): 10, Frequency (Times/Week): 7, Weekly Exercise (Minutes/Week): 70, Intensity: Mild, Exercise limited by: None identified   Goals    . Quit smoking / using tobacco     Recommend to attend smoking cessation classes or contact 1-800-QUIT-NOW       Fall Risk Fall Risk  04/25/2019 11/02/2018 04/21/2018 10/07/2017 04/02/2017  Falls in the past year? 0 0 0 No No  Number falls in past yr: 0 0 0 - -  Injury with Fall? 0 0 - - -  Follow up Falls prevention discussed Falls evaluation completed - - -   FALL RISK PREVENTION PERTAINING TO THE HOME:  Any stairs in or  around the home? Yes  If so, do they handrails? Yes   Home free of loose throw rugs in walkways, pet beds, electrical cords, etc? Yes  Adequate lighting in your home to reduce risk of falls? Yes   ASSISTIVE DEVICES UTILIZED TO PREVENT FALLS:  Life alert? No  Use of a cane, walker or w/c? No  Grab bars in the bathroom? Yes  Shower chair or bench in shower? Yes  Elevated toilet seat or a handicapped toilet? No   DME ORDERS:  DME order needed?  No   TIMED UP AND GO:  Was the test performed? Yes .  Length of time to ambulate 10 feet: 5 sec.   GAIT:  Appearance of gait: Gait stead-fast and without the use of an assistive device.   Education: Fall risk prevention has been discussed.  Intervention(s) required? No   Depression Screen PHQ 2/9 Scores 04/25/2019 04/18/2019 11/02/2018 04/21/2018  PHQ - 2 Score 0 1 0 0     Cognitive Function     6CIT Screen 04/25/2019 04/21/2018 04/02/2017  What Year? 0 points 0 points 0 points  What month? 0 points 0 points 0 points  What time? 0 points 0 points 0 points  Count back from 20 0 points 0 points 0 points  Months in reverse 0 points 0 points 2 points  Repeat phrase 0 points 0 points 0 points  Total Score 0 0 2    Immunization History  Administered Date(s) Administered  . Influenza, High Dose Seasonal PF 04/05/2015, 03/18/2016, 03/06/2017, 03/18/2019  . Influenza-Unspecified 03/24/2019  . Pneumococcal Conjugate-13 03/08/2014  . Pneumococcal Polysaccharide-23 09/17/2012  . Zoster Recombinat  (Shingrix) 07/08/2018, 12/14/2018    Qualifies for Shingles Vaccine? Yes  Shingrix series completed.   Tdap: Up to date  Flu Vaccine: Up to date  Pneumococcal Vaccine: Up to date   Screening Tests Health Maintenance  Topic Date Due  . MAMMOGRAM  05/27/2019  . COLONOSCOPY  01/20/2023  . TETANUS/TDAP  09/28/2024  . INFLUENZA VACCINE  Completed  . DEXA SCAN  Completed  . Hepatitis C Screening  Completed  . PNA vac Low Risk Adult  Completed    Cancer Screenings:  Colorectal Screening: Completed 01/19/18. Repeat every 5 years;   Mammogram: Completed 05/26/18. Repeat every year; scheduled for 05/30/19.  Bone Density: Completed 03/10/09. Results reflect NORMAL. Repeat every 2 years. Ordered today. Pt provided with contact information and advised to call to schedule appt.   Lung Cancer Screening: (Low Dose CT Chest recommended if Age 75-80 years, 30 pack-year currently smoking OR have quit w/in 15years.) does not qualify.    Additional Screening:  Hepatitis C Screening: does qualify; Completed 05/08/17  Vision Screening: Recommended annual ophthalmology exams for early detection of glaucoma and other disorders of the eye. Is the patient up to date with their annual eye exam?  Yes  Who is the provider or what is the name of the office in which the pt attends annual eye exams? Dr. Wyatt Portela  Dental Screening: Recommended annual dental exams for proper oral hygiene  Community Resource Referral:  CRR required this visit?  No       Plan:     I have personally reviewed and addressed the Medicare Annual Wellness questionnaire and have noted the following in the patient's chart:  A. Medical and social history B. Use of alcohol, tobacco or illicit drugs  C. Current medications and supplements D. Functional ability and status E.  Nutritional status F.  Physical activity  G. Advance directives H. List of other physicians I.  Hospitalizations, surgeries, and ER visits in previous  12 months J.  [place] such as hearing and vision if needed, cognitive and depression L. Referrals and appointments   In addition, I have reviewed and discussed with patient certain preventive protocols, quality metrics, and best practice recommendations. A written personalized care plan for preventive services as well as general preventive health recommendations were provided to patient.   Signed,  Clemetine Marker, LPN Nurse Health Advisor   Nurse Notes: none

## 2019-05-19 ENCOUNTER — Telehealth: Payer: Self-pay | Admitting: Internal Medicine

## 2019-05-19 NOTE — Telephone Encounter (Signed)
Burning sensation between breast bone,like heart burn its been going on for a week. No shortness of breath.

## 2019-05-19 NOTE — Telephone Encounter (Signed)
Spoke with tara on what she suggest the patient should do, which was to try an over the counter med to try for heart burn. Patient did say she have been taking Tums and it would work sometimes and then it would come back. Informed patient that this could potentially be something else that could occur as a heart attack or some type of episode that requires emergency attention. She claims that her husband does her temp and vitals regularly and that she does not think that she needs to be seen in the ER.

## 2019-05-20 NOTE — Telephone Encounter (Signed)
Dr. Army Melia is out of the office until Monday. Patient can be seen Monday at an appt to discuss heart burn? If she has ANY chest PAIN, shortness of breath, or slurred speech/drooping face then she does need to go to the ER ASAP. Thank you.

## 2019-05-23 ENCOUNTER — Other Ambulatory Visit: Payer: Self-pay

## 2019-05-23 ENCOUNTER — Encounter: Payer: Self-pay | Admitting: Internal Medicine

## 2019-05-23 ENCOUNTER — Ambulatory Visit (INDEPENDENT_AMBULATORY_CARE_PROVIDER_SITE_OTHER): Payer: Medicare Other | Admitting: Internal Medicine

## 2019-05-23 VITALS — BP 112/62 | HR 87 | Ht 64.0 in | Wt 119.0 lb

## 2019-05-23 DIAGNOSIS — K219 Gastro-esophageal reflux disease without esophagitis: Secondary | ICD-10-CM

## 2019-05-23 NOTE — Progress Notes (Signed)
Date:  05/23/2019   Name:  Lindsey Golden   DOB:  [birth year]   MRN:  [id number]   Chief Complaint: Heartburn (X 1 week ago- woke up in the middle of night with burning in chest and throat. Took tums for 1 week straight every night due to severe burning. Tums only gives temperary relief. )  Heartburn She complains of heartburn. She reports no abdominal pain, no chest pain, no choking, no coughing, no nausea or no water brash. This is a new problem. The current episode started in the past 7 days. The problem occurs constantly. The heartburn is located in the substernum. The heartburn is of moderate intensity. The heartburn wakes her from sleep. The heartburn does not limit her activity. The heartburn doesn't change with position. The symptoms are aggravated by certain foods and lying down. Pertinent negatives include no fatigue. She has tried an antacid for the symptoms. The treatment provided no relief (only temporary relief).    Lab Results  Component Value Date   CREATININE 0.61 05/05/2018   BUN 14 05/05/2018   NA 137 05/05/2018   K 4.1 05/05/2018   CL 104 05/05/2018   CO2 25 05/05/2018   Lab Results  Component Value Date   CHOL 197 10/07/2017   HDL 89 10/07/2017   LDLCALC 87 10/07/2017   TRIG 105 10/07/2017   CHOLHDL 2.2 10/07/2017   Lab Results  Component Value Date   TSH 0.775 02/16/2018   Lab Results  Component Value Date   HGBA1C 5.0 09/02/2016     Review of Systems  Constitutional: Negative for chills, fatigue and fever.  Respiratory: Negative for cough and choking.   Cardiovascular: Negative for chest pain and palpitations.  Gastrointestinal: Positive for heartburn. Negative for abdominal pain, blood in stool and nausea.  Neurological: Negative for dizziness and headaches.    Patient Active Problem List   Diagnosis Date Noted  . Hypothyroidism due to acquired atrophy of thyroid 11/02/2018  . Vitamin D deficiency 02/16/2018  . Tingling in  extremities 02/16/2018  . Myalgia 02/02/2018  . Adenomatous colon polyp 10/07/2017  . 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 Seasonal allergies 05/13/2016  . Degenerative arthritis of knee, bilateral 11/26/2015  . Asthma 11/28/2014    Allergies  Allergen Reactions  . Aspirin Hives  . Penicillins Other (See Comments)  . Dairy Aid [Lactase] Diarrhea    Past Surgical History:  Procedure Laterality Date  . 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 BREAST BIOPSY Bilateral 1971   benign  . BREAST SURGERY    . COLONOSCOPY  03/19/2012   tubular adenoma - repeat 5 years Dr. Vira Agar  . KNEE SURGERY     five different surgery    Social History   Tobacco Use  . Smoking status: Current Some Day Smoker    Packs/day: 0.15    Years: 40.00    Pack years: 6.00    Types: Cigarettes  . Smokeless tobacco: Never Used  . Tobacco comment: 2-3 cigarettes daily  Substance Use Topics  . Alcohol use: No  . Drug use: No     Medication list has been reviewed and updated.  Current Meds  Medication Sig  . albuterol (VENTOLIN HFA) 108 (90 Base) MCG/ACT inhaler INHALE 2 PUFFS BY MOUTH EVERY 6 HOURS ASNEEDED WHEEZING/ SHORTNESS OFBREATH  . cetirizine (ZYRTEC) 10 MG tablet Take 1 tablet (10 mg total) by mouth daily.  . cyclobenzaprine (FLEXERIL)  5 MG tablet Take 1 tablet (5 mg total) by mouth 3 (three) times daily as needed for muscle spasms.  . ferrous sulfate 325 (65 FE) MG tablet Take 325 mg by mouth daily with breakfast.  . gabapentin (NEURONTIN) 100 MG capsule Take 2 capsules (200 mg total) by mouth 2 (two) times daily. Dr. Melrose Nakayama- Neuro  . hydrochlorothiazide (HYDRODIURIL) 25 MG tablet TAKE 1/2 TABLET BY MOUTH ONCE DAILY AS NEEDED  . levothyroxine (SYNTHROID) 75 MCG tablet TAKE 1 TABLET BY MOUTH ONCE DAILY ON AN EMPTY STOMACH. WAIT 30 MINUTES BEFORE TAKING OTHER MEDS.  . Multiple Vitamins-Minerals (MULTIPLE VITAMINS/WOMENS PO) Take by mouth.  .  SYMBICORT 160-4.5 MCG/ACT inhaler INHALE 2 PUFFS BY MOUTH TWICE DAILY    PHQ 2/9 Scores 05/23/2019 04/25/2019 04/18/2019 11/02/2018  PHQ - 2 Score 2 0 1 0  PHQ- 9 Score 3 - - -    BP Readings from Last 3 Encounters:  05/23/19 112/62  04/25/19 92/62  04/18/19 112/72    Physical Exam Vitals and nursing note reviewed.  Constitutional:      General: She is not in acute distress.    Appearance: She is well-developed.  HENT:     Head: Normocephalic and atraumatic.  Cardiovascular:     Rate and Rhythm: Normal rate and regular rhythm.     Heart sounds: No murmur.  Pulmonary:     Effort: Pulmonary effort is normal. No respiratory distress.     Breath sounds: No wheezing or rhonchi.  Abdominal:     General: Abdomen is flat. There is no distension.     Palpations: Abdomen is soft.     Tenderness: There is no abdominal tenderness.     Hernia: No hernia is present.  Musculoskeletal:        General: Normal range of motion.  Skin:    General: Skin is warm and dry.     Findings: No rash.  Neurological:     Mental Status: She is alert and oriented to person, place, and time.  Psychiatric:        Behavior: Behavior normal.        Thought Content: Thought content normal.     Wt Readings from Last 3 Encounters:  05/23/19 119 lb (54 kg)  04/25/19 117 lb (53.1 kg)  04/18/19 117 lb (53.1 kg)    BP 112/62   Pulse 87   Ht 5\' 4"  (1.626 m)   Wt 119 lb (54 kg)   SpO2 97%   BMI 20.43 kg/m   Assessment and Plan: 1. Gastroesophageal reflux disease, unspecified whether esophagitis present Symptoms have now resolved, no alarm symptoms are present Recommend Pepcid otc as needed  Would also recommend Abdominal US if recurrent   Partially dictated using Dragon software. Any errors are unintentional.  Halina Maidens, MD [facility]  05/23/2019

## 2019-05-23 NOTE — Patient Instructions (Signed)

## 2019-05-30 ENCOUNTER — Other Ambulatory Visit: Payer: Self-pay

## 2019-05-30 ENCOUNTER — Ambulatory Visit
Admission: RE | Admit: 2019-05-30 | Discharge: 2019-05-30 | Disposition: A | Discharge status: Home / Self Care | Payer: Medicare Other | Source: Ambulatory Visit | Attending: Internal Medicine | Admitting: Internal Medicine

## 2019-05-30 DIAGNOSIS — Z1231 Encounter for screening mammogram for malignant neoplasm of breast: Secondary | ICD-10-CM

## 2019-05-30 DIAGNOSIS — M810 Age-related osteoporosis without current pathological fracture: Secondary | ICD-10-CM | POA: Diagnosis not present

## 2019-05-30 DIAGNOSIS — Z780 Asymptomatic menopausal state: Secondary | ICD-10-CM | POA: Diagnosis not present

## 2019-05-30 DIAGNOSIS — Z13820 Encounter for screening for osteoporosis: Secondary | ICD-10-CM | POA: Insufficient documentation

## 2019-06-08 ENCOUNTER — Ambulatory Visit (INDEPENDENT_AMBULATORY_CARE_PROVIDER_SITE_OTHER): Payer: Medicare Other | Admitting: Internal Medicine

## 2019-06-08 ENCOUNTER — Other Ambulatory Visit: Payer: Self-pay

## 2019-06-08 ENCOUNTER — Encounter: Payer: Self-pay | Admitting: Internal Medicine

## 2019-06-08 VITALS — BP 103/68 | HR 80 | Resp 17 | Ht 64.0 in | Wt 117.0 lb

## 2019-06-08 DIAGNOSIS — M810 Age-related osteoporosis without current pathological fracture: Secondary | ICD-10-CM | POA: Diagnosis not present

## 2019-06-08 DIAGNOSIS — R1011 Right upper quadrant pain: Secondary | ICD-10-CM

## 2019-06-08 DIAGNOSIS — K219 Gastro-esophageal reflux disease without esophagitis: Secondary | ICD-10-CM | POA: Insufficient documentation

## 2019-06-08 NOTE — Progress Notes (Signed)
Date:  06/08/2019   Name:  Lindsey Golden   DOB:  1945/07/06   MRN:  [id number]   Chief Complaint: Heartburn (pain in R side off and on under R rib. Regular BM no UTI issues )  Abdominal Pain This is a recurrent problem. The current episode started more than 1 month ago. The problem occurs intermittently. Progression since onset: has been increasing recently. The pain is located in the RUQ. The quality of the pain is cramping and aching. The abdominal pain radiates to the RUQ. Pertinent negatives include no constipation, diarrhea, dysuria, fever, headaches, hematochezia, melena, nausea, vomiting or weight loss. Her past medical history is significant for GERD.  Gastroesophageal Reflux She complains of abdominal pain and heartburn. She reports no chest pain, no coughing or no nausea. The problem has been rapidly improving. Pertinent negatives include no fatigue, melena or weight loss. She has tried a histamine-2 antagonist for the symptoms. The treatment provided significant relief.  OP - noted on DEXA done recently.  She reports hx of taking Fosamax with severe abd pain as a side effect.  She stopped the medication and has been taking calcium and vitamin D daily.  She would consider trying one of the infusion drugs such as Reclast but does not want oral bisphosphonates.  Lab Results  Component Value Date   CREATININE 0.61 05/05/2018   BUN 14 05/05/2018   NA 137 05/05/2018   K 4.1 05/05/2018   CL 104 05/05/2018   CO2 25 05/05/2018   Lab Results  Component Value Date   CHOL 197 10/07/2017   HDL 89 10/07/2017   LDLCALC 87 10/07/2017   TRIG 105 10/07/2017   CHOLHDL 2.2 10/07/2017   Lab Results  Component Value Date   TSH 0.775 02/16/2018   Lab Results  Component Value Date   HGBA1C 5.0 09/02/2016     Review of Systems  Constitutional: Negative for chills, fatigue, fever, unexpected weight change and weight loss.  Respiratory: Negative for cough, chest tightness and  shortness of breath.   Cardiovascular: Negative for chest pain and leg swelling.  Gastrointestinal: Positive for abdominal pain and heartburn. Negative for constipation, diarrhea, hematochezia, melena, nausea and vomiting.  Genitourinary: Negative for difficulty urinating and dysuria.  Neurological: Negative for dizziness and headaches.    Patient Active Problem List   Diagnosis Date Noted  . Hypothyroidism due to acquired atrophy of thyroid 11/02/2018  . Vitamin D deficiency 02/16/2018  . Tingling in extremities 02/16/2018  . Myalgia 02/02/2018  . Adenomatous colon polyp 10/07/2017  . 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 Seasonal allergies 05/13/2016  . Degenerative arthritis of knee, bilateral 11/26/2015  . Asthma 11/28/2014    Allergies  Allergen Reactions  . Aspirin Hives  . Penicillins Other (See Comments)  . Dairy Aid [Lactase] Diarrhea    Past Surgical History:  Procedure Laterality Date  . 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 BREAST BIOPSY Bilateral 1971   benign  . BREAST SURGERY    . COLONOSCOPY  03/19/2012   tubular adenoma - repeat 5 years Dr. Vira Agar  . KNEE SURGERY     five different surgery    Social History   Tobacco Use  . Smoking status: Current Some Day Smoker    Packs/day: 0.15    Years: 40.00    Pack years: 6.00    Types: Cigarettes  . Smokeless tobacco: Never Used  . Tobacco comment: 2-3  cigarettes daily  Substance Use Topics  . Alcohol use: No  . Drug use: No     Medication list has been reviewed and updated.  Current Meds  Medication Sig  . albuterol (VENTOLIN HFA) 108 (90 Base) MCG/ACT inhaler INHALE 2 PUFFS BY MOUTH EVERY 6 HOURS ASNEEDED WHEEZING/ SHORTNESS OFBREATH  . cetirizine (ZYRTEC) 10 MG tablet Take 1 tablet (10 mg total) by mouth daily.  . ferrous sulfate 325 (65 FE) MG tablet Take 325 mg by mouth daily with breakfast.  . gabapentin (NEURONTIN) 100 MG capsule Take 2 capsules  (200 mg total) by mouth 2 (two) times daily. Dr. Melrose Nakayama- Neuro  . levothyroxine (SYNTHROID) 75 MCG tablet TAKE 1 TABLET BY MOUTH ONCE DAILY ON AN EMPTY STOMACH. WAIT 30 MINUTES BEFORE TAKING OTHER MEDS.  . Multiple Vitamins-Minerals (MULTIPLE VITAMINS/WOMENS PO) Take by mouth.  . SYMBICORT 160-4.5 MCG/ACT inhaler INHALE 2 PUFFS BY MOUTH TWICE DAILY  . [DISCONTINUED] cyclobenzaprine (FLEXERIL) 5 MG tablet Take 1 tablet (5 mg total) by mouth 3 (three) times daily as needed for muscle spasms.  . [DISCONTINUED] hydrochlorothiazide (HYDRODIURIL) 25 MG tablet TAKE 1/2 TABLET BY MOUTH ONCE DAILY AS NEEDED    PHQ 2/9 Scores 06/08/2019 05/23/2019 04/25/2019 04/18/2019  PHQ - 2 Score 0 2 0 1  PHQ- 9 Score 0 3 - -    BP Readings from Last 3 Encounters:  06/08/19 103/68  05/23/19 112/62  04/25/19 92/62    Physical Exam Vitals and nursing note reviewed.  Constitutional:      General: She is not in acute distress.    Appearance: She is well-developed.  HENT:     Head: Normocephalic and atraumatic.  Cardiovascular:     Rate and Rhythm: Normal rate and regular rhythm.  Pulmonary:     Effort: Pulmonary effort is normal. No respiratory distress.     Breath sounds: No wheezing or rhonchi.  Abdominal:     General: Abdomen is flat.     Palpations: Abdomen is soft.     Tenderness: There is abdominal tenderness in the right upper quadrant. There is no right CVA tenderness, left CVA tenderness, guarding or rebound.     Hernia: No hernia is present.  Musculoskeletal:        General: Normal range of motion.     Cervical back: Normal range of motion.     Right lower leg: No edema.     Left lower leg: No edema.  Lymphadenopathy:     Cervical: No cervical adenopathy.  Skin:    General: Skin is warm and dry.     Findings: No rash.  Neurological:     Mental Status: She is alert and oriented to person, place, and time.  Psychiatric:        Behavior: Behavior normal.        Thought Content: Thought  content normal.     Wt Readings from Last 3 Encounters:  06/08/19 117 lb (53.1 kg)  05/23/19 119 lb (54 kg)  04/25/19 117 lb (53.1 kg)    BP 103/68   Pulse 80   Resp 17   Ht 5\' 4"  (1.626 m)   Wt 117 lb (53.1 kg)   SpO2 98%   BMI 20.08 kg/m   Assessment and Plan: 1. RUQ pain Will get [place] to rule out gall bladder disease May just be abdominal wall pain as there was some tenderness to gentle palpation while abd muscles were flexed - US Abdomen Limited RUQ; Future  2. GERD  without esophagitis Clinically much improved with PPI.  No alarm signs noted. May continue daily Pepcid. - CBC with Differential/Platelet - Comprehensive metabolic panel  3. Age-related osteoporosis without current pathological fracture Intolerant of oral medications.  Mother died after complications from hip fracture due to osteoporosis. Continue calcium and vitamin D daily; regular physical activity Refer for consideration of therapy - Vitamin D (25 hydroxy) - Ambulatory referral to Endocrinology   Partially dictated using [place]. Any errors are unintentional.  Halina Maidens, MD [facility]  06/08/2019

## 2019-06-09 LAB — COMPREHENSIVE METABOLIC PANEL
ALT: 9 IU/L (ref 0–32)
AST: 20 IU/L (ref 0–40)
Albumin/Globulin Ratio: 1.8 (ref 1.2–2.2)
Albumin: 4.3 g/dL (ref 3.7–4.7)
Alkaline Phosphatase: 88 IU/L (ref 39–117)
BUN/Creatinine Ratio: 16 (ref 12–28)
BUN: 13 mg/dL (ref 8–27)
Bilirubin Total: 0.4 mg/dL (ref 0.0–1.2)
CO2: 24 mmol/L (ref 20–29)
Calcium: 9.3 mg/dL (ref 8.7–10.3)
Chloride: 102 mmol/L (ref 96–106)
Creatinine, Ser: 0.79 mg/dL (ref 0.57–1.00)
GFR calc Af Amer: 87 mL/min/{1.73_m2} (ref >59)
GFR calc non Af Amer: 76 mL/min/{1.73_m2} (ref >59)
Globulin, Total: 2.4 g/dL (ref 1.5–4.5)
Glucose: 106 mg/dL — ABNORMAL HIGH (ref 65–99)
Potassium: 4.7 mmol/L (ref 3.5–5.2)
Sodium: 140 mmol/L (ref 134–144)
Total Protein: 6.7 g/dL (ref 6.0–8.5)

## 2019-06-09 LAB — CBC WITH DIFFERENTIAL/PLATELET
Basophils Absolute: 0.1 10*3/uL (ref 0.0–0.2)
Basos: 1 %
EOS (ABSOLUTE): 0.1 10*3/uL (ref 0.0–0.4)
Eos: 1 %
Hematocrit: 42.7 % (ref 34.0–46.6)
Hemoglobin: 14.5 g/dL (ref 11.1–15.9)
Immature Grans (Abs): 0 10*3/uL (ref 0.0–0.1)
Immature Granulocytes: 0 %
Lymphocytes Absolute: 2.7 10*3/uL (ref 0.7–3.1)
Lymphs: 25 %
MCH: 32.6 pg (ref 26.6–33.0)
MCHC: 34 g/dL (ref 31.5–35.7)
MCV: 96 fL (ref 79–97)
Monocytes Absolute: 0.6 10*3/uL (ref 0.1–0.9)
Monocytes: 6 %
Neutrophils Absolute: 7.5 10*3/uL — ABNORMAL HIGH (ref 1.4–7.0)
Neutrophils: 67 %
Platelets: 282 10*3/uL (ref 150–450)
RBC: 4.45 x10E6/uL (ref 3.77–5.28)
RDW: 11.5 % — ABNORMAL LOW (ref 11.7–15.4)
WBC: 11 10*3/uL — ABNORMAL HIGH (ref 3.4–10.8)

## 2019-06-09 LAB — VITAMIN D 25 HYDROXY (VIT D DEFICIENCY, FRACTURES): Vit D, 25-Hydroxy: 56.1 ng/mL (ref 30.0–100.0)

## 2019-06-20 ENCOUNTER — Other Ambulatory Visit: Payer: Self-pay | Admitting: Internal Medicine

## 2019-06-20 ENCOUNTER — Ambulatory Visit
Admission: RE | Admit: 2019-06-20 | Discharge: 2019-06-20 | Disposition: A | Discharge status: Home / Self Care | Payer: Medicare Other | Source: Ambulatory Visit | Attending: Internal Medicine | Admitting: Internal Medicine

## 2019-06-20 ENCOUNTER — Other Ambulatory Visit: Payer: Self-pay

## 2019-06-20 DIAGNOSIS — R1011 Right upper quadrant pain: Secondary | ICD-10-CM | POA: Insufficient documentation

## 2019-06-20 NOTE — Progress Notes (Signed)
Patient called and said she seen the results on my chart and said since her [place] was normal what is the next steps. She is still experiencing pain under her rib cage. Wants to know should she see GI for a endoscopy?

## 2019-06-20 NOTE — Progress Notes (Signed)
Patient informed and she will go to [facility] tomorrow for H Pylori test.

## 2019-06-21 DIAGNOSIS — M810 Age-related osteoporosis without current pathological fracture: Secondary | ICD-10-CM | POA: Diagnosis not present

## 2019-06-21 DIAGNOSIS — K219 Gastro-esophageal reflux disease without esophagitis: Secondary | ICD-10-CM | POA: Diagnosis not present

## 2019-06-29 DIAGNOSIS — R1011 Right upper quadrant pain: Secondary | ICD-10-CM | POA: Diagnosis not present

## 2019-06-30 LAB — H. PYLORI BREATH TEST: H pylori Breath Test: POSITIVE — AB

## 2019-07-01 ENCOUNTER — Other Ambulatory Visit: Payer: Self-pay | Admitting: Internal Medicine

## 2019-07-01 DIAGNOSIS — Z8619 Personal history of other infectious and parasitic diseases: Secondary | ICD-10-CM | POA: Insufficient documentation

## 2019-07-01 DIAGNOSIS — A048 Other specified bacterial intestinal infections: Secondary | ICD-10-CM

## 2019-07-01 MED ORDER — METRONIDAZOLE 500 MG PO TABS: 500.0000 mg | ORAL_TABLET | Freq: Three times a day (TID) | ORAL | 0 refills | Status: AC

## 2019-07-01 MED ORDER — CLARITHROMYCIN 500 MG PO TABS: 500.0000 mg | ORAL_TABLET | Freq: Two times a day (BID) | ORAL | 0 refills | Status: AC

## 2019-07-01 MED ORDER — OMEPRAZOLE 20 MG PO CPDR: 20.0000 mg | DELAYED_RELEASE_CAPSULE | Freq: Two times a day (BID) | ORAL | 0 refills | Status: DC

## 2019-07-01 NOTE — Progress Notes (Signed)
Pt informed. Said she had a small reaction when she was in her 35s to amoxicillin but she knows it was not near death or anaphylaxis. She is willing to try amoxicillin and the other antibiotic your sending in to treat this ASAP.   She wants these abx sent to [facility].  Please advise.

## 2019-07-12 ENCOUNTER — Telehealth: Payer: Self-pay

## 2019-07-12 NOTE — Telephone Encounter (Signed)
Pt called complaining of stomach pain. Said she is still uncomfortable under her right rib. Still has 3 more days of abx to take.  Told pt since she tested POS for H Pylori it will take time to feel better after finishing the abx.  Pt verbalized understanding.

## 2019-07-13 ENCOUNTER — Other Ambulatory Visit: Payer: Self-pay | Admitting: Internal Medicine

## 2019-07-13 DIAGNOSIS — R202 Paresthesia of skin: Secondary | ICD-10-CM

## 2019-07-25 DIAGNOSIS — M810 Age-related osteoporosis without current pathological fracture: Secondary | ICD-10-CM | POA: Diagnosis not present

## 2019-07-25 DIAGNOSIS — Z8639 Personal history of other endocrine, nutritional and metabolic disease: Secondary | ICD-10-CM | POA: Diagnosis not present

## 2019-07-25 LAB — TSH: TSH: 0.45 (ref 0.41–5.90)

## 2019-07-30 DIAGNOSIS — Z23 Encounter for immunization: Secondary | ICD-10-CM | POA: Diagnosis not present

## 2019-08-21 DIAGNOSIS — Z23 Encounter for immunization: Secondary | ICD-10-CM | POA: Diagnosis not present

## 2019-08-22 ENCOUNTER — Other Ambulatory Visit: Payer: Self-pay | Admitting: Internal Medicine

## 2019-08-22 DIAGNOSIS — J4520 Mild intermittent asthma, uncomplicated: Secondary | ICD-10-CM

## 2019-09-21 ENCOUNTER — Telehealth: Payer: Self-pay

## 2019-09-21 NOTE — Telephone Encounter (Signed)
Contacted pt and scheduled for a follow up abdominal pain on 10/05/19.

## 2019-09-21 NOTE — Telephone Encounter (Signed)
Patient left message. Having sever pain under right breast. Would like to see Dr. Allen Norris immediately. I only have permission to book her in June.

## 2019-09-22 DIAGNOSIS — M810 Age-related osteoporosis without current pathological fracture: Secondary | ICD-10-CM | POA: Diagnosis not present

## 2019-09-26 ENCOUNTER — Other Ambulatory Visit: Payer: Self-pay | Admitting: Internal Medicine

## 2019-09-26 DIAGNOSIS — J4520 Mild intermittent asthma, uncomplicated: Secondary | ICD-10-CM

## 2019-10-05 ENCOUNTER — Other Ambulatory Visit: Payer: Self-pay

## 2019-10-05 ENCOUNTER — Encounter: Payer: Self-pay | Admitting: Gastroenterology

## 2019-10-05 ENCOUNTER — Ambulatory Visit (INDEPENDENT_AMBULATORY_CARE_PROVIDER_SITE_OTHER): Payer: Medicare Other | Admitting: Gastroenterology

## 2019-10-05 VITALS — BP 118/77 | HR 92 | Temp 97.4°F | Ht 64.0 in | Wt 117.4 lb

## 2019-10-05 DIAGNOSIS — R1011 Right upper quadrant pain: Secondary | ICD-10-CM | POA: Diagnosis not present

## 2019-10-05 DIAGNOSIS — G8929 Other chronic pain: Secondary | ICD-10-CM | POA: Diagnosis not present

## 2019-10-05 MED ORDER — CYCLOBENZAPRINE HCL 5 MG PO TABS: 5.0000 mg | ORAL_TABLET | Freq: Three times a day (TID) | ORAL | 1 refills | Status: DC | PRN

## 2019-10-05 NOTE — Progress Notes (Signed)
Primary Care Physician: Glean Hess, MD  Primary Gastroenterologist:  Dr. [name]  Chief Complaint  Patient presents with  . Follow-up    HPI: Lindsey Golden is a 74 y.o. female here for follow-up of right-sided abdominal pain.  The patient states that she has had this right upper quadrant pain under her breast on the right side.  The patient had seen me in December 2019 with the same issue and at that time was found to have musculoskeletal pain clearly reproducible while flexing the abdominal wall muscles.  At that time the patient was concerned because she was going on a cruise and did not want to get sick on the cruise.  The patient recently contacted her primary care provider reporting similar pains.  In the past the pain to be not made better or worse by eating or drinking.  They were also not associated with bowel movements.  They had no GI correlation in the past.  The patient now comes in with very similar symptoms as before without any association to eating drinking or moving her bowels. The patient does report that she has lost approximately 3 pounds over the last few months without trying.  Past Medical History:  Diagnosis Date  . Allergy   . Asthma    mild intermittent due to allergies.  . Osteoarthritis   . Thyroid disease     Current Outpatient Medications  Medication Sig Dispense Refill  . albuterol (VENTOLIN HFA) 108 (90 Base) MCG/ACT inhaler INHALE 2 PUFFS BY MOUTH EVERY 6 HOURS ASNEEDED WHEEZING/ SHORTNESS OF BREATH 8.5 g 5  . cetirizine (ZYRTEC) 10 MG tablet Take 1 tablet (10 mg total) by mouth daily. 30 tablet 11  . ferrous sulfate 325 (65 FE) MG tablet Take 325 mg by mouth daily with breakfast.    . gabapentin (NEURONTIN) 100 MG capsule TAKE 2 CAPSULES BY MOUTH TWICE DAILY 360 capsule 3  . levothyroxine (SYNTHROID) 75 MCG tablet TAKE 1 TABLET BY MOUTH ONCE DAILY ON AN EMPTY STOMACH. WAIT 30 MINUTES BEFORE TAKING OTHER MEDS. 90 tablet 1  .  Multiple Vitamins-Minerals (MULTIPLE VITAMINS/WOMENS PO) Take by mouth.    . SYMBICORT 160-4.5 MCG/ACT inhaler INHALE 2 PUFFS BY MOUTH TWICE DAILY 10.2 g 5  . omeprazole (PRILOSEC) 20 MG capsule Take 1 capsule (20 mg total) by mouth 2 (two) times daily. 60 capsule 0   No current facility-administered medications for this visit.    Allergies as of 10/05/2019 - Review Complete 10/05/2019  Allergen Reaction Noted  . Aspirin Hives 11/28/2014  . Penicillins Other (See Comments) 11/28/2014  . Dairy aid [lactase] Diarrhea 04/02/2017    ROS:  General: Negative for anorexia, weight loss, fever, chills, fatigue, weakness. ENT: Negative for hoarseness, difficulty swallowing , nasal congestion. CV: Negative for chest pain, angina, palpitations, dyspnea on exertion, peripheral edema.  Respiratory: Negative for dyspnea at rest, dyspnea on exertion, cough, sputum, wheezing.  GI: See history of present illness. GU:  Negative for dysuria, hematuria, urinary incontinence, urinary frequency, nocturnal urination.  Endo: Negative for unusual weight change.    Physical Examination:   BP 118/77   Pulse 92   Temp (!) 97.4 F (36.3 C) (Temporal)   Ht 5\' 4"  (1.626 m)   Wt 117 lb 6.4 oz (53.3 kg)   BMI 20.15 kg/m   General: Well-nourished, well-developed in no acute distress.  Eyes: No icterus. Conjunctivae pink. Lungs: Clear to auscultation bilaterally. Non-labored. Heart: Regular rate and rhythm, no murmurs rubs  or gallops.  Abdomen: Bowel sounds are normal, again reproducible abdominal tenderness with flexion of the abdominal wall muscles with 1 finger palpation, nondistended, no hepatosplenomegaly or masses, no abdominal bruits or hernia , no rebound or guarding.   Extremities: No lower extremity edema. No clubbing or deformities. Neuro: Alert and oriented x 3.  Grossly intact. Skin: Warm and dry, no jaundice.   Psych: Alert and cooperative, normal mood and affect.  Labs:    Imaging Studies:  No results found.  Assessment and Plan:   Lindsey Golden is a 74 y.o. y/o female who comes in with continued abdominal wall tenderness that is reproducible with 1 finger palpation while flexing the abdominal wall muscles. With the muscles flex the pain clearly increases. The patient has been explained this and has again been given a prescription of Flexeril. The patient did not take the last prescription because she thought it was a narcotic. The patient has been assured that it is not a narcotic and that she should use warm compresses to the area to help with the muscle spasms. The patient has been explained the plan agrees with it.     [name], MD. [facility]    Note: This dictation was prepared with Dragon dictation along with smaller phrase technology. Any transcriptional errors that result from this process are unintentional.

## 2019-11-29 LAB — BASIC METABOLIC PANEL
BUN: 13 (ref 4–21)
Creatinine: 0.8 (ref 0.5–1.1)
Potassium: 4.7 (ref 3.4–5.3)
Sodium: 142 (ref 137–147)

## 2019-11-29 LAB — VITAMIN D 25 HYDROXY (VIT D DEFICIENCY, FRACTURES): Vit D, 25-Hydroxy: 56

## 2019-12-20 DIAGNOSIS — M1711 Unilateral primary osteoarthritis, right knee: Secondary | ICD-10-CM | POA: Diagnosis not present

## 2020-01-21 ENCOUNTER — Other Ambulatory Visit: Payer: Self-pay | Admitting: Internal Medicine

## 2020-01-21 DIAGNOSIS — E034 Atrophy of thyroid (acquired): Secondary | ICD-10-CM

## 2020-01-21 NOTE — Telephone Encounter (Signed)
Requested medication (s) are due for refill today: yes  Requested medication (s) are on the active medication list: yes  Last refill:  04/19/19  Future visit scheduled: no  Notes to clinic:  overdue lab work   Requested Prescriptions  Pending Prescriptions Disp Refills   levothyroxine (SYNTHROID) 75 MCG tablet [Pharmacy Med Name: LEVOTHYROXINE SODIUM 75 MCG TAB] 90 tablet 1    Sig: TAKE 1 TABLET BY MOUTH ONCE DAILY ON AN EMPTY STOMACH. WAIT 30 MINUTES BEFORE TAKING OTHER MEDS.      Endocrinology:  Hypothyroid Agents Failed - 01/21/2020 11:39 AM      Failed - TSH needs to be rechecked within 3 months after an abnormal result. Refill until TSH is due.      Failed - TSH in normal range and within 360 days    TSH  Date Value Ref Range Status  02/16/2018 0.775 0.450 - 4.500 uIU/mL Final          Passed - Valid encounter within last 12 months    Recent Outpatient Visits           7 months ago Age-related osteoporosis without current pathological fracture   [facility] Glean Hess, MD   8 months ago Gastroesophageal reflux disease, unspecified whether esophagitis present   [facility] Glean Hess, MD   9 months ago Strain of right knee, initial encounter   [facility] Glean Hess, MD   1 year ago Tingling in extremities   [facility] Glean Hess, MD   1 year ago RUQ abdominal pain   [facility] Glean Hess, MD

## 2020-01-24 ENCOUNTER — Telehealth: Payer: Self-pay | Admitting: Internal Medicine

## 2020-01-24 NOTE — Telephone Encounter (Signed)
Called pt to let her know that she needs an office visit. Her last visit was 06/08/2019 and she does not have a upcoming appt either. Offered to sch. Appt for pt. Pt stated that she will call in the next few days to schedule an appt.  KP

## 2020-01-24 NOTE — Telephone Encounter (Signed)
Copied from [place] [id number]. Topic: General - Inquiry >> Jan 24, 2020  3:30 PM Alease Frame wrote: Reason for CRM: Pt is calling for [facility] nurse . Pt had a med refill and when she picked up she got a 30 day supply with no refills . She is wanting a phone call back from nurse .

## 2020-02-01 ENCOUNTER — Other Ambulatory Visit: Payer: Self-pay

## 2020-02-01 ENCOUNTER — Encounter: Payer: Self-pay | Admitting: Internal Medicine

## 2020-02-01 ENCOUNTER — Other Ambulatory Visit
Admission: RE | Admit: 2020-02-01 | Discharge: 2020-02-01 | Disposition: A | Discharge status: Home / Self Care | Payer: Medicare Other | Attending: Internal Medicine | Admitting: Internal Medicine

## 2020-02-01 ENCOUNTER — Ambulatory Visit (INDEPENDENT_AMBULATORY_CARE_PROVIDER_SITE_OTHER): Payer: Medicare Other | Admitting: Internal Medicine

## 2020-02-01 VITALS — BP 118/68 | HR 88 | Temp 97.8°F | Ht 64.0 in | Wt 118.0 lb

## 2020-02-01 DIAGNOSIS — J4520 Mild intermittent asthma, uncomplicated: Secondary | ICD-10-CM | POA: Diagnosis not present

## 2020-02-01 DIAGNOSIS — M810 Age-related osteoporosis without current pathological fracture: Secondary | ICD-10-CM | POA: Diagnosis not present

## 2020-02-01 DIAGNOSIS — E034 Atrophy of thyroid (acquired): Secondary | ICD-10-CM | POA: Diagnosis not present

## 2020-02-01 DIAGNOSIS — M25473 Effusion, unspecified ankle: Secondary | ICD-10-CM | POA: Diagnosis not present

## 2020-02-01 LAB — T4, FREE: Free T4: 0.98 ng/dL (ref 0.61–1.12)

## 2020-02-01 LAB — TSH: TSH: 0.623 u[IU]/mL (ref 0.350–4.500)

## 2020-02-01 MED ORDER — LEVOTHYROXINE SODIUM 75 MCG PO TABS: ORAL_TABLET | ORAL | 5 refills | Status: DC

## 2020-02-01 NOTE — Progress Notes (Signed)
Date:  02/01/2020   Name:  Lindsey Golden   DOB:  09-28-1945   MRN:  [id number]   Chief Complaint: Hypothyroidism (follow up )  Thyroid Problem Presents for follow-up visit. Patient reports no anxiety, fatigue or palpitations. The symptoms have been stable.  Asthma There is no cough, sputum production or wheezing. The problem occurs rarely. Pertinent negatives include no chest pain, fever or headaches. Her symptoms are aggravated by pollen. Her symptoms are alleviated by steroid inhaler and beta-agonist. She reports significant improvement on treatment. Her past medical history is significant for asthma.    Immunization History  Administered Date(s) Administered  . Influenza, High Dose Seasonal PF 04/05/2015, 03/18/2016, 03/06/2017, 03/18/2019  . Influenza-Unspecified 03/24/2019  . PFIZER SARS-COV-2 Vaccination 07/30/2019, 08/21/2019  . Pneumococcal Conjugate-13 03/08/2014  . Pneumococcal Polysaccharide-23 09/17/2012  . Zoster Recombinat (Shingrix) 07/08/2018, 12/14/2018    Lab Results  Component Value Date   CREATININE 0.8 11/29/2019   BUN 13 11/29/2019   NA 142 11/29/2019   K 4.7 11/29/2019   CL 102 06/08/2019   CO2 24 06/08/2019   Lab Results  Component Value Date   CHOL 197 10/07/2017   HDL 89 10/07/2017   LDLCALC 87 10/07/2017   TRIG 105 10/07/2017   CHOLHDL 2.2 10/07/2017   Lab Results  Component Value Date   TSH 0.45 07/25/2019   Lab Results  Component Value Date   HGBA1C 5.0 09/02/2016   Lab Results  Component Value Date   WBC 11.0 (H) 06/08/2019   HGB 14.5 06/08/2019   HCT 42.7 06/08/2019   MCV 96 06/08/2019   PLT 282 06/08/2019   Lab Results  Component Value Date   ALT 9 06/08/2019   AST 20 06/08/2019   ALKPHOS 88 06/08/2019   BILITOT 0.4 06/08/2019     Review of Systems  Constitutional: Negative for chills, fatigue, fever and unexpected weight change.  Respiratory: Negative for cough, sputum production, chest tightness and  wheezing.   Cardiovascular: Positive for leg swelling (intermittent - take hctz as needed). Negative for chest pain and palpitations.  Neurological: Negative for dizziness, light-headedness and headaches.  Psychiatric/Behavioral: Negative for dysphoric mood and sleep disturbance. The patient is not nervous/anxious.     Patient Active Problem List   Diagnosis Date Noted  . Mild ankle edema 02/01/2020  . Helicobacter pylori infection 07/01/2019  . GERD without esophagitis 06/08/2019  . Age-related osteoporosis without current pathological fracture 06/08/2019  . Hypothyroidism due to acquired atrophy of thyroid 11/02/2018  . Vitamin D deficiency 02/16/2018  . Tingling in extremities 02/16/2018  . Myalgia 02/02/2018  . Adenomatous colon polyp 10/07/2017  . 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 Seasonal allergies 05/13/2016  . Degenerative arthritis of knee, bilateral 11/26/2015  . Asthma 11/28/2014    Allergies  Allergen Reactions  . Aspirin Hives  . Penicillins Other (See Comments)  . Dairy Aid [Lactase] Diarrhea    Past Surgical History:  Procedure Laterality Date  . 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 BREAST BIOPSY Bilateral 1971   benign  . BREAST SURGERY    . COLONOSCOPY  03/19/2012   tubular adenoma - repeat 5 years Dr. Vira Agar  . KNEE SURGERY     five different surgery    Social History   Tobacco Use  . Smoking status: Current Some Day Smoker    Packs/day: 0.15    Years: 40.00    Pack years: 6.00  Types: Cigarettes  . Smokeless tobacco: Never Used  . Tobacco comment: 2-3 cigarettes daily  Vaping Use  . Vaping Use: Never used  Substance Use Topics  . Alcohol use: No  . Drug use: No     Medication list has been reviewed and updated.  Current Meds  Medication Sig  . albuterol (VENTOLIN HFA) 108 (90 Base) MCG/ACT inhaler INHALE 2 PUFFS BY MOUTH EVERY 6 HOURS ASNEEDED WHEEZING/ SHORTNESS OF BREATH  . ferrous  sulfate 325 (65 FE) MG tablet Take 325 mg by mouth daily with breakfast.  . gabapentin (NEURONTIN) 100 MG capsule TAKE 2 CAPSULES BY MOUTH TWICE DAILY  . levothyroxine (SYNTHROID) 75 MCG tablet TAKE 1 TABLET BY MOUTH ONCE DAILY ON AN EMPTY STOMACH. WAIT 30 MINUTES BEFORE TAKING OTHER MEDS.  . Multiple Vitamins-Minerals (MULTIPLE VITAMINS/WOMENS PO) Take by mouth.  . SYMBICORT 160-4.5 MCG/ACT inhaler INHALE 2 PUFFS BY MOUTH TWICE DAILY  . [DISCONTINUED] levothyroxine (SYNTHROID) 75 MCG tablet TAKE 1 TABLET BY MOUTH ONCE DAILY ON AN EMPTY STOMACH. WAIT 30 MINUTES BEFORE TAKING OTHER MEDS.    PHQ 2/9 Scores 06/08/2019 05/23/2019 04/25/2019 04/18/2019  PHQ - 2 Score 0 2 0 1  PHQ- 9 Score 0 3 - -    No flowsheet data found.  BP Readings from Last 3 Encounters:  02/01/20 118/68  10/05/19 118/77  06/08/19 103/68    Physical Exam Vitals and nursing note reviewed.  Constitutional:      General: She is not in acute distress.    Appearance: She is well-developed.  HENT:     Head: Normocephalic and atraumatic.  Cardiovascular:     Rate and Rhythm: Normal rate and regular rhythm.     Pulses: Normal pulses.     Heart sounds: No murmur heard.   Pulmonary:     Effort: Pulmonary effort is normal. No respiratory distress.  Abdominal:     General: Abdomen is flat.     Palpations: Abdomen is soft.  Musculoskeletal:        General: Normal range of motion.     Right lower leg: Edema (trace ankle) present.     Left lower leg: Edema (trace ankle) present.  Skin:    General: Skin is warm and dry.     Capillary Refill: Capillary refill takes less than 2 seconds.     Findings: No rash.  Neurological:     General: No focal deficit present.     Mental Status: She is alert and oriented to person, place, and time.  Psychiatric:        Mood and Affect: Mood normal.     Wt Readings from Last 3 Encounters:  02/01/20 118 lb (53.5 kg)  10/05/19 117 lb 6.4 oz (53.3 kg)  06/08/19 117 lb (53.1 kg)      BP 118/68   Pulse 88   Temp 97.8 F (36.6 C) (Oral)   Ht 5\' 4"  (1.626 m)   Wt 118 lb (53.5 kg)   SpO2 96%   BMI 20.25 kg/m   Assessment and Plan: 1. Hypothyroidism due to acquired atrophy of thyroid Supplemented; medication refilled No s/s of inadequate treatment - levothyroxine (SYNTHROID) 75 MCG tablet; TAKE 1 TABLET BY MOUTH ONCE DAILY ON AN EMPTY STOMACH. WAIT 30 MINUTES BEFORE TAKING OTHER MEDS.  Dispense: 30 tablet; Refill: 5 - TSH + free T4  2. Mild intermittent asthma, unspecified whether complicated Continue Symbicort daily with albuterol PRN  3. Mild ankle edema Consider change to chlorthalidone Recent renal function  normal Pt will continue with HCTZ for now since she takes it rarely   Partially dictated using Editor, commissioning. Any errors are unintentional.  Halina Maidens, MD [facility]  02/01/2020

## 2020-03-08 ENCOUNTER — Other Ambulatory Visit: Payer: Self-pay | Admitting: Internal Medicine

## 2020-03-08 DIAGNOSIS — J4520 Mild intermittent asthma, uncomplicated: Secondary | ICD-10-CM

## 2020-03-08 NOTE — Telephone Encounter (Signed)
Medication Refill - Medication: SYMBICORT 160-4.5 MCG/ACT inhaler [[id number]]     Preferred Pharmacy (with phone number or street name):  [facility], [place] [address]  Phone: [phone] Fax: [phone]     Agent: Please be advised that RX refills may take up to 3 business days. We ask that you follow-up with your pharmacy.

## 2020-04-16 DIAGNOSIS — Z23 Encounter for immunization: Secondary | ICD-10-CM | POA: Diagnosis not present

## 2020-04-21 DIAGNOSIS — Z23 Encounter for immunization: Secondary | ICD-10-CM | POA: Diagnosis not present

## 2020-04-25 ENCOUNTER — Ambulatory Visit (INDEPENDENT_AMBULATORY_CARE_PROVIDER_SITE_OTHER): Payer: Medicare Other

## 2020-04-25 ENCOUNTER — Other Ambulatory Visit: Payer: Self-pay

## 2020-04-25 VITALS — BP 108/62 | HR 68 | Temp 97.9°F | Resp 16 | Ht 64.0 in | Wt 118.6 lb

## 2020-04-25 DIAGNOSIS — Z0000 Encounter for general adult medical examination without abnormal findings: Secondary | ICD-10-CM

## 2020-04-25 DIAGNOSIS — Z1231 Encounter for screening mammogram for malignant neoplasm of breast: Secondary | ICD-10-CM

## 2020-04-25 NOTE — Patient Instructions (Signed)
Lindsey Golden , Thank you for taking time to come for your Medicare Wellness Visit. I appreciate your ongoing commitment to your health goals. Please review the following plan we discussed and let me know if I can assist you in the future.   Screening recommendations/referrals: Colonoscopy: done 01/19/18. Repeat in 2024 Mammogram: done 05/30/19. Please call [phone] to schedule your mammogram.  Bone Density: done 05/30/19 Recommended yearly ophthalmology/optometry visit for glaucoma screening and checkup Recommended yearly dental visit for hygiene and checkup  Vaccinations: Influenza vaccine: done 04/16/20 Pneumococcal vaccine: done 03/08/14 Tdap vaccine: done 09/29/14 Shingles vaccine: done 07/08/18 & 12/14/18   Covid-19:done 07/30/19, 08/21/19 & 03/27/20  Advanced directives: Please bring a copy of your health care power of attorney and living will to the office at your convenience.  Conditions/risks identified: If you wish to quit smoking, help is available. For free tobacco cessation program offerings call the [facility] at 617-058-5214 or Live Well Line at 3073215810. You may also visit [email or web address] or email [email or web address] for more information on other programs.   Next appointment: Follow up in one year for your annual wellness visit    Preventive Care 65 Years and Older, Female Preventive care refers to lifestyle choices and visits with your health care provider that can promote health and wellness. What does preventive care include?  A yearly physical exam. This is also called an annual well check.  Dental exams once or twice a year.  Routine eye exams. Ask your health care provider how often you should have your eyes checked.  Personal lifestyle choices, including:  Daily care of your teeth and gums.  Regular physical activity.  Eating a healthy diet.  Avoiding tobacco and drug use.  Limiting alcohol use.  Practicing safe  sex.  Taking low-dose aspirin every day.  Taking vitamin and mineral supplements as recommended by your health care provider. What happens during an annual well check? The services and screenings done by your health care provider during your annual well check will depend on your age, overall health, lifestyle risk factors, and family history of disease. Counseling  Your health care provider may ask you questions about your:  Alcohol use.  Tobacco use.  Drug use.  Emotional well-being.  Home and relationship well-being.  Sexual activity.  Eating habits.  History of falls.  Memory and ability to understand (cognition).  Work and work Statistician.  Reproductive health. Screening  You may have the following tests or measurements:  Height, weight, and BMI.  Blood pressure.  Lipid and cholesterol levels. These may be checked every 5 years, or more frequently if you are over 19 years old.  Skin check.  Lung cancer screening. You may have this screening every year starting at age 50 if you have a 30-pack-year history of smoking and currently smoke or have quit within the past 15 years.  Fecal occult blood test (FOBT) of the stool. You may have this test every year starting at age 60.  Flexible sigmoidoscopy or colonoscopy. You may have a sigmoidoscopy every 5 years or a colonoscopy every 10 years starting at age 23.  Hepatitis C blood test.  Hepatitis B blood test.  Sexually transmitted disease (STD) testing.  Diabetes screening. This is done by checking your blood sugar (glucose) after you have not eaten for a while (fasting). You may have this done every 1-3 years.  Bone density scan. This is done to screen for osteoporosis. You may have this done starting at age  72.  Mammogram. This may be done every 1-2 years. Talk to your health care provider about how often you should have regular mammograms. Talk with your health care provider about your test results,  treatment options, and if necessary, the need for more tests. Vaccines  Your health care provider may recommend certain vaccines, such as:  Influenza vaccine. This is recommended every year.  Tetanus, diphtheria, and acellular pertussis (Tdap, Td) vaccine. You may need a Td booster every 10 years.  Zoster vaccine. You may need this after age 87.  Pneumococcal 13-valent conjugate (PCV13) vaccine. One dose is recommended after age 63.  Pneumococcal polysaccharide (PPSV23) vaccine. One dose is recommended after age 77. Talk to your health care provider about which screenings and vaccines you need and how often you need them. This information is not intended to replace advice given to you by your health care provider. Make sure you discuss any questions you have with your health care provider. Document Released: 06/22/2015 Document Revised: 02/13/2016 Document Reviewed: 03/27/2015 Elsevier Interactive Patient Education  2017 [place] Prevention in the Home Falls can cause injuries. They can happen to people of all ages. There are many things you can do to make your home safe and to help prevent falls. What can I do on the outside of my home?  Regularly fix the edges of walkways and driveways and fix any cracks.  Remove anything that might make you trip as you walk through a door, such as a raised step or threshold.  Trim any bushes or trees on the path to your home.  Use bright outdoor lighting.  Clear any walking paths of anything that might make someone trip, such as rocks or tools.  Regularly check to see if handrails are loose or broken. Make sure that both sides of any steps have handrails.  Any raised decks and porches should have guardrails on the edges.  Have any leaves, snow, or ice cleared regularly.  Use sand or salt on walking paths during winter.  Clean up any spills in your garage right away. This includes oil or grease spills. What can I do in the  bathroom?  Use night lights.  Install grab bars by the toilet and in the tub and shower. Do not use towel bars as grab bars.  Use non-skid mats or decals in the tub or shower.  If you need to sit down in the shower, use a plastic, non-slip stool.  Keep the floor dry. Clean up any water that spills on the floor as soon as it happens.  Remove soap buildup in the tub or shower regularly.  Attach bath mats securely with double-sided non-slip rug tape.  Do not have throw rugs and other things on the floor that can make you trip. What can I do in the bedroom?  Use night lights.  Make sure that you have a light by your bed that is easy to reach.  Do not use any sheets or blankets that are too big for your bed. They should not hang down onto the floor.  Have a firm chair that has side arms. You can use this for support while you get dressed.  Do not have throw rugs and other things on the floor that can make you trip. What can I do in the kitchen?  Clean up any spills right away.  Avoid walking on wet floors.  Keep items that you use a lot in easy-to-reach places.  If you need  to reach something above you, use a strong step stool that has a grab bar.  Keep electrical cords out of the way.  Do not use floor polish or wax that makes floors slippery. If you must use wax, use non-skid floor wax.  Do not have throw rugs and other things on the floor that can make you trip. What can I do with my stairs?  Do not leave any items on the stairs.  Make sure that there are handrails on both sides of the stairs and use them. Fix handrails that are broken or loose. Make sure that handrails are as long as the stairways.  Check any carpeting to make sure that it is firmly attached to the stairs. Fix any carpet that is loose or worn.  Avoid having throw rugs at the top or bottom of the stairs. If you do have throw rugs, attach them to the floor with carpet tape.  Make sure that you have a  light switch at the top of the stairs and the bottom of the stairs. If you do not have them, ask someone to add them for you. What else can I do to help prevent falls?  Wear shoes that:  Do not have high heels.  Have rubber bottoms.  Are comfortable and fit you well.  Are closed at the toe. Do not wear sandals.  If you use a stepladder:  Make sure that it is fully opened. Do not climb a closed stepladder.  Make sure that both sides of the stepladder are locked into place.  Ask someone to hold it for you, if possible.  Clearly mark and make sure that you can see:  Any grab bars or handrails.  First and last steps.  Where the edge of each step is.  Use tools that help you move around (mobility aids) if they are needed. These include:  Canes.  Walkers.  Scooters.  Crutches.  Turn on the lights when you go into a dark area. Replace any light bulbs as soon as they burn out.  Set up your furniture so you have a clear path. Avoid moving your furniture around.  If any of your floors are uneven, fix them.  If there are any pets around you, be aware of where they are.  Review your medicines with your doctor. Some medicines can make you feel dizzy. This can increase your chance of falling. Ask your doctor what other things that you can do to help prevent falls. This information is not intended to replace advice given to you by your health care provider. Make sure you discuss any questions you have with your health care provider. Document Released: 03/22/2009 Document Revised: 11/01/2015 Document Reviewed: 06/30/2014 Elsevier Interactive Patient Education  2017 [employer].

## 2020-04-25 NOTE — Progress Notes (Signed)
Subjective:   Vergie Zahm Zackery is a 74 y.o. female who presents for Medicare Annual (Subsequent) preventive examination.  Review of Systems     Cardiac Risk Factors include: advanced age (>33men, >38 women)     Objective:    Today's Vitals   04/25/20 0850  BP: 108/62  Pulse: 68  Resp: 16  Temp: 97.9 F (36.6 C)  TempSrc: Oral  Weight: 118 lb 9.6 oz (53.8 kg)  Height: 5\' 4"  (1.626 m)   Body mass index is 20.36 kg/m.  Advanced Directives 04/25/2020 04/25/2019 04/21/2018 04/02/2017  Does Patient Have a Medical Advance Directive? Yes No Yes Yes  Type of Paramedic of [place];Living will - [place];Living will [place];Living will  Does patient want to make changes to medical advance directive? - No - Patient declined - -  Copy of [place] in Chart? No - copy requested - No - copy requested No - copy requested    Current Medications (verified) Outpatient Encounter Medications as of 04/25/2020  Medication Sig  . albuterol (VENTOLIN HFA) 108 (90 Base) MCG/ACT inhaler INHALE 2 PUFFS BY MOUTH EVERY 6 HOURS ASNEEDED WHEEZING/ SHORTNESS OF BREATH  . cetirizine (ZYRTEC) 10 MG tablet Take 1 tablet (10 mg total) by mouth daily.  . ferrous sulfate 325 (65 FE) MG tablet Take 325 mg by mouth daily with breakfast.  . gabapentin (NEURONTIN) 100 MG capsule TAKE 2 CAPSULES BY MOUTH TWICE DAILY  . levothyroxine (SYNTHROID) 75 MCG tablet TAKE 1 TABLET BY MOUTH ONCE DAILY ON AN EMPTY STOMACH. WAIT 30 MINUTES BEFORE TAKING OTHER MEDS.  . Multiple Vitamins-Minerals (MULTIPLE VITAMINS/WOMENS PO) Take by mouth.  . SYMBICORT 160-4.5 MCG/ACT inhaler INHALE 2 PUFFS BY MOUTH TWICE DAILY  . [DISCONTINUED] cyclobenzaprine (FLEXERIL) 5 MG tablet Take 1 tablet (5 mg total) by mouth 3 (three) times daily as needed for muscle spasms. (Patient not taking: Reported on 02/01/2020)   No facility-administered encounter  medications on file as of 04/25/2020.    Allergies (verified) Aspirin, Penicillins, and Dairy aid [lactase]   History: Past Medical History:  Diagnosis Date  . Allergy   . Asthma    mild intermittent due to allergies.  . Osteoarthritis   . Thyroid disease    Past Surgical History:  Procedure Laterality Date  . 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 BREAST BIOPSY Bilateral 1971   benign  . BREAST SURGERY    . COLONOSCOPY  03/19/2012   tubular adenoma - repeat 5 years Dr. Vira Agar  . KNEE SURGERY     five different surgery   Family History  Problem Relation Age of Onset  . Depression Mother   . Hearing loss Mother   . Mental illness Mother   . Miscarriages / [place] Mother   . Asthma Father   . Kidney disease Father   . Stroke Father   . Varicose Veins Father   . Breast cancer Neg Hx    Social History   Socioeconomic History  . Marital status: Married    Spouse name: Not on file  . Number of children: 0  . Years of education: Not on file  . Highest education level: Some college, no degree  Occupational History  . Occupation: retired  Tobacco Use  . Smoking status: Current Some Day Smoker    Packs/day: 0.15    Years: 40.00    Pack years: 6.00    Types: Cigarettes  .  Smokeless tobacco: Never Used  . Tobacco comment: 2-3 cigarettes daily  Vaping Use  . Vaping Use: Never used  Substance and Sexual Activity  . Alcohol use: No  . Drug use: No  . Sexual activity: Never  Other Topics Concern  . Not on file  Social History Narrative  . Not on file   Social Determinants of Health   Financial Resource Strain: Low Risk   . Difficulty of Paying Living Expenses: Not hard at all  Food Insecurity: No Food Insecurity  . Worried About Charity fundraiser in the Last Year: Never true  . Ran Out of Food in the Last Year: Never true  Transportation Needs: No Transportation Needs  . Lack of Transportation (Medical): No  . Lack of Transportation  (Non-Medical): No  Physical Activity: Insufficiently Active  . Days of Exercise per Week: 7 days  . Minutes of Exercise per Session: 10 min  Stress: No Stress Concern Present  . Feeling of Stress : Not at all  Social Connections: Moderately Isolated  . Frequency of Communication with Friends and Family: More than three times a week  . Frequency of Social Gatherings with Friends and Family: More than three times a week  . Attends Religious Services: Never  . Active Member of Clubs or Organizations: No  . Attends Archivist Meetings: Never  . Marital Status: Married    Tobacco Counseling Ready to quit: Not Answered Counseling given: Not Answered Comment: 2-3 cigarettes daily   Clinical Intake:  Pre-visit preparation completed: Yes  Pain : No/denies pain     BMI - recorded: 20.36 Nutritional Status: BMI of 19-24  Normal Nutritional Risks: None Diabetes: No  How often do you need to have someone help you when you read instructions, pamphlets, or other written materials from your doctor or pharmacy?: 1 - Never    Interpreter Needed?: No  Information entered by :: Clemetine Marker LPN   Activities of Daily Living In your present state of health, do you have any difficulty performing the following activities: 04/25/2020  Hearing? N  Comment declines hearing aids  Vision? N  Difficulty concentrating or making decisions? N  Walking or climbing stairs? N  Dressing or bathing? N  Doing errands, shopping? N  Preparing Food and eating ? N  Using the Toilet? N  In the past six months, have you accidently leaked urine? N  Do you have problems with loss of bowel control? N  Managing your Medications? N  Managing your Finances? N  Housekeeping or managing your Housekeeping? N  Some recent data might be hidden    Patient Care Team: Glean Hess, MD as PCP - General (Internal Medicine)  Indicate any recent Medical Services you may have received from other than  [facility] providers in the past year (date may be approximate).     Assessment:   This is a routine wellness examination for [place].  Hearing/Vision screen  Hearing Screening   125Hz  250Hz  500Hz  1000Hz  2000Hz  3000Hz  4000Hz  6000Hz  8000Hz   Right ear:           Left ear:           Comments: Pt denies hearing difficulty  Vision Screening Comments: Annual vision screenings with Dr. [facility]att [facility] at [facility] (every 2 years)  Dietary issues and exercise activities discussed: Current Exercise Habits: Home exercise routine, Type of exercise: stretching, Time (Minutes): 10, Frequency (Times/Week): 7, Weekly Exercise (Minutes/Week): 70, Intensity: Mild, Exercise limited by: None identified  Goals    .  Quit smoking / using tobacco     Recommend to attend smoking cessation classes or contact 1-800-QUIT-NOW      Depression Screen PHQ 2/9 Scores 04/25/2020 06/08/2019 05/23/2019 04/25/2019 04/18/2019 11/02/2018 04/21/2018  PHQ - 2 Score 0 0 2 0 1 0 0  PHQ- 9 Score - 0 3 - - - -    Fall Risk Fall Risk  04/25/2020 06/08/2019 04/25/2019 11/02/2018 04/21/2018  Falls in the past year? 0 0 0 0 0  Number falls in past yr: 0 0 0 0 0  Injury with Fall? 0 0 0 0 -  Risk for fall due to : No Fall Risks - - - -  Follow up Falls prevention discussed - Falls prevention discussed Falls evaluation completed -    Any stairs in or around the home? Yes  If so, are there any without handrails? No  Home free of loose throw rugs in walkways, pet beds, electrical cords, etc? Yes  Adequate lighting in your home to reduce risk of falls? Yes   ASSISTIVE DEVICES UTILIZED TO PREVENT FALLS:  Life alert? No  Use of a cane, walker or w/c? No  Grab bars in the bathroom? Yes  Shower chair or bench in shower? No  Elevated toilet seat or a handicapped toilet? No   TIMED UP AND GO:  Was the test performed? Yes .  Length of time to ambulate 10 feet: 5 sec.   Gait steady and fast without use of assistive device  Cognitive  Function: Cognitive screening tool deferred due to normal cognitive status assessed by direct observation by this Nurse Health Advisor.       6CIT Screen 04/25/2019 04/21/2018 04/02/2017  What Year? 0 points 0 points 0 points  What month? 0 points 0 points 0 points  What time? 0 points 0 points 0 points  Count back from 20 0 points 0 points 0 points  Months in reverse 0 points 0 points 2 points  Repeat phrase 0 points 0 points 0 points  Total Score 0 0 2    Immunizations Immunization History  Administered Date(s) Administered  . Influenza, High Dose Seasonal PF 04/05/2015, 03/18/2016, 03/06/2017, 03/18/2019, 04/16/2020  . Influenza-Unspecified 03/24/2019  . PFIZER SARS-COV-2 Vaccination 07/30/2019, 08/21/2019, 03/27/2020  . Pneumococcal Conjugate-13 03/08/2014  . Pneumococcal Polysaccharide-23 09/17/2012  . Tdap 09/29/2014  . Zoster Recombinat (Shingrix) 07/08/2018, 12/14/2018    TDAP status: Up to date   Flu Vaccine status: Up to date   Pneumococcal vaccine status: Up to date   Covid-19 vaccine status: Completed vaccines  Qualifies for Shingles Vaccine? Yes   Zostavax completed Yes   Shingrix Completed?: Yes  Screening Tests Health Maintenance  Topic Date Due  . MAMMOGRAM  05/29/2020  . COLONOSCOPY  01/20/2023  . TETANUS/TDAP  09/28/2024  . INFLUENZA VACCINE  Completed  . DEXA SCAN  Completed  . COVID-19 Vaccine  Completed  . Hepatitis C Screening  Completed  . PNA vac Low Risk Adult  Completed    Health Maintenance  There are no preventive care reminders to display for this patient.  Colorectal cancer screening: Completed 01/19/18. Repeat every 5 years   Mammogram status: Completed 05/30/19. Repeat every year. Ordered today.   Bone Density status: Completed 05/30/19. Results reflect: Bone density results: OSTEOPOROSIS. Repeat every 2 years.  Pt had reclast infusion 09/2019.  Lung Cancer Screening: (Low Dose CT Chest recommended if Age 65-80 years, 30  pack-year currently smoking OR have quit w/in 15years.) does not qualify.  Additional Screening:  Hepatitis C Screening: does qualify; Completed 05/08/17  Vision Screening: Recommended annual ophthalmology exams for early detection of glaucoma and other disorders of the eye. Is the patient up to date with their annual eye exam?  Yes  Who is the provider or what is the name of the office in which the patient attends annual eye exams? Dr. Wyatt Portela  Dental Screening: Recommended annual dental exams for proper oral hygiene  Community Resource Referral / Chronic Care Management: CRR required this visit?  No   CCM required this visit?  No      Plan:     I have personally reviewed and noted the following in the patient's chart:   . Medical and social history . Use of alcohol, tobacco or illicit drugs  . Current medications and supplements . Functional ability and status . Nutritional status . Physical activity . Advanced directives . List of other physicians . Hospitalizations, surgeries, and ER visits in previous 12 months . Vitals . Screenings to include cognitive, depression, and falls . Referrals and appointments  In addition, I have reviewed and discussed with patient certain preventive protocols, quality metrics, and best practice recommendations. A written personalized care plan for preventive services as well as general preventive health recommendations were provided to patient.     Clemetine Marker, LPN   [id number]   Nurse Notes: pt doing well and appreciative of visit per day

## 2020-05-11 ENCOUNTER — Telehealth: Payer: Self-pay

## 2020-05-11 NOTE — Telephone Encounter (Signed)
Called pt told her that she would need to call her insurance company to see which alternative inhaler that they would cover. Told pt to give [place] a call back and let us know which inhaler would be covered. Pt verbalized understanding.  KP

## 2020-05-11 NOTE — Telephone Encounter (Signed)
Copied from [place] [phone]. Topic: General - Other >> May 11, 2020  1:56 PM Rainey Pines A wrote: Patient would like a callback from Dr. Oneal Deputy nurse in regards to her albuterol inhaler not being covered anymore at the start of next year and wants to speak with nurse about medication alternative. Please advise

## 2020-05-18 ENCOUNTER — Other Ambulatory Visit: Payer: Self-pay | Admitting: Internal Medicine

## 2020-05-18 DIAGNOSIS — J4520 Mild intermittent asthma, uncomplicated: Secondary | ICD-10-CM

## 2020-06-04 ENCOUNTER — Telehealth: Payer: Self-pay | Admitting: Internal Medicine

## 2020-06-04 NOTE — Telephone Encounter (Unsigned)
Copied from CRM [id number]. Topic: General - Other >> Jun 04, 2020  3:14 PM Gwenlyn Fudge wrote: Reason for CRM: Pt called stating that she needs to speak with nurse. She states that she received a notice stating that in January her insurance will not cover Albuterol and that she will need to be changed to Ventolin. Please advise .     [facility] [place], [place] - [address] [place] [address] Phone: [phone] Fax: [phone] Hours: Not open 24 hours

## 2020-06-05 ENCOUNTER — Other Ambulatory Visit: Payer: Self-pay

## 2020-06-05 DIAGNOSIS — J4520 Mild intermittent asthma, uncomplicated: Secondary | ICD-10-CM

## 2020-06-05 MED ORDER — ALBUTEROL SULFATE HFA 108 (90 BASE) MCG/ACT IN AERS: INHALATION_SPRAY | RESPIRATORY_TRACT | 2 refills | Status: DC

## 2020-06-05 NOTE — Telephone Encounter (Signed)
Sent in DAW Ventolin to be filled on 06/09/2020 for patient to her pharmacy for the new year.

## 2020-06-07 ENCOUNTER — Other Ambulatory Visit: Payer: Self-pay | Admitting: Internal Medicine

## 2020-06-07 DIAGNOSIS — E034 Atrophy of thyroid (acquired): Secondary | ICD-10-CM

## 2020-06-07 DIAGNOSIS — R202 Paresthesia of skin: Secondary | ICD-10-CM

## 2020-06-23 ENCOUNTER — Other Ambulatory Visit: Payer: Self-pay | Admitting: Internal Medicine

## 2020-06-23 DIAGNOSIS — J4520 Mild intermittent asthma, uncomplicated: Secondary | ICD-10-CM

## 2020-07-16 ENCOUNTER — Ambulatory Visit
Admission: RE | Admit: 2020-07-16 | Discharge: 2020-07-16 | Disposition: A | Discharge status: Home / Self Care | Payer: Medicare Other | Source: Ambulatory Visit | Attending: Internal Medicine | Admitting: Internal Medicine

## 2020-07-16 ENCOUNTER — Other Ambulatory Visit: Payer: Self-pay

## 2020-07-16 DIAGNOSIS — Z1231 Encounter for screening mammogram for malignant neoplasm of breast: Secondary | ICD-10-CM | POA: Diagnosis not present

## 2020-07-31 ENCOUNTER — Other Ambulatory Visit: Payer: Self-pay | Admitting: Internal Medicine

## 2020-07-31 DIAGNOSIS — R202 Paresthesia of skin: Secondary | ICD-10-CM

## 2020-07-31 NOTE — Telephone Encounter (Signed)
Requested medication (s) are due for refill today: expired medication  Requested medication (s) are on the active medication list: yes  Last refill:  07/13/19 #360 3 refills   Future visit scheduled: yes in 1 week   Notes to clinic:  expired medication. Do you want to renew Rx?     Requested Prescriptions  Pending Prescriptions Disp Refills   gabapentin (NEURONTIN) 100 MG capsule [Pharmacy Med Name: GABAPENTIN 100 MG CAP] 360 capsule 3    Sig: TAKE 2 CAPSULES BY MOUTH TWICE DAILY      Neurology: Anticonvulsants - gabapentin Passed - 07/31/2020  2:38 PM      Passed - Valid encounter within last 12 months    Recent Outpatient Visits           6 months ago Mild ankle edema   [facility] Glean Hess, MD   1 year ago Age-related osteoporosis without current pathological fracture   [facility] Glean Hess, MD   1 year ago Gastroesophageal reflux disease, unspecified whether esophagitis present   [facility] Glean Hess, MD   1 year ago Strain of right knee, initial encounter   [facility] Glean Hess, MD   1 year ago Tingling in extremities   [facility] Glean Hess, MD       Future Appointments             In 1 week Army Melia Jesse Sans, MD [facility], [facility]

## 2020-08-09 ENCOUNTER — Ambulatory Visit (INDEPENDENT_AMBULATORY_CARE_PROVIDER_SITE_OTHER): Payer: Medicare Other | Admitting: Internal Medicine

## 2020-08-09 ENCOUNTER — Encounter: Payer: Self-pay | Admitting: Internal Medicine

## 2020-08-09 ENCOUNTER — Other Ambulatory Visit: Payer: Self-pay

## 2020-08-09 VITALS — BP 102/64 | HR 68 | Ht 64.0 in | Wt 119.0 lb

## 2020-08-09 DIAGNOSIS — J4520 Mild intermittent asthma, uncomplicated: Secondary | ICD-10-CM | POA: Diagnosis not present

## 2020-08-09 DIAGNOSIS — K219 Gastro-esophageal reflux disease without esophagitis: Secondary | ICD-10-CM | POA: Diagnosis not present

## 2020-08-09 DIAGNOSIS — M810 Age-related osteoporosis without current pathological fracture: Secondary | ICD-10-CM

## 2020-08-09 DIAGNOSIS — E034 Atrophy of thyroid (acquired): Secondary | ICD-10-CM | POA: Diagnosis not present

## 2020-08-09 MED ORDER — LEVOTHYROXINE SODIUM 75 MCG PO TABS: 75.0000 ug | ORAL_TABLET | Freq: Every day | ORAL | 3 refills | Status: DC

## 2020-08-09 NOTE — Progress Notes (Signed)
Date:  08/09/2020   Name:  Lindsey Golden   DOB:  [birth year]   MRN:  [id number]   Chief Complaint: Hypothyroidism (Med Refills.), Asthma, and Gastroesophageal Reflux  Asthma She complains of chest tightness, cough and wheezing. There is no hoarse voice. This is a recurrent (uses albuterol several times per week) problem. The problem occurs intermittently. The problem has been unchanged. The cough is non-productive. Associated symptoms include heartburn. Pertinent negatives include no chest pain, headaches, trouble swallowing or weight loss. Her symptoms are aggravated by pollen and change in weather. Her symptoms are alleviated by beta-agonist and steroid inhaler. Her past medical history is significant for asthma.  Gastroesophageal Reflux She complains of coughing, heartburn and wheezing. She reports no chest pain or no hoarse voice. This is a recurrent problem. The problem occurs occasionally. Pertinent negatives include no fatigue or weight loss. She has tried a histamine-2 antagonist for the symptoms. The treatment provided moderate relief.  Thyroid Problem Presents for follow-up visit. Patient reports no anxiety, constipation, depressed mood, diarrhea, dry skin, fatigue, hoarse voice, leg swelling, palpitations, weight gain or weight loss. The symptoms have been stable.    Lab Results  Component Value Date   CREATININE 0.8 11/29/2019   BUN 13 11/29/2019   NA 142 11/29/2019   K 4.7 11/29/2019   CL 102 06/08/2019   CO2 24 06/08/2019   Lab Results  Component Value Date   CHOL 197 10/07/2017   HDL 89 10/07/2017   LDLCALC 87 10/07/2017   TRIG 105 10/07/2017   CHOLHDL 2.2 10/07/2017   Lab Results  Component Value Date   TSH 0.623 02/01/2020   Lab Results  Component Value Date   HGBA1C 5.0 09/02/2016   Lab Results  Component Value Date   WBC 11.0 (H) 06/08/2019   HGB 14.5 06/08/2019   HCT 42.7 06/08/2019   MCV 96 06/08/2019   PLT 282 06/08/2019   Lab Results   Component Value Date   ALT 9 06/08/2019   AST 20 06/08/2019   ALKPHOS 88 06/08/2019   BILITOT 0.4 06/08/2019     Review of Systems  Constitutional: Negative for fatigue, unexpected weight change, weight gain and weight loss.  HENT: Negative for hoarse voice and trouble swallowing.   Respiratory: Positive for cough and wheezing. Negative for chest tightness.   Cardiovascular: Negative for chest pain, palpitations and leg swelling.  Gastrointestinal: Positive for heartburn. Negative for abdominal distention, constipation and diarrhea.  Neurological: Negative for dizziness, light-headedness and headaches.  Psychiatric/Behavioral: Negative for dysphoric mood and sleep disturbance. The patient is not nervous/anxious.     Patient Active Problem List   Diagnosis Date Noted  . Mild ankle edema 02/01/2020  . Helicobacter pylori infection 07/01/2019  . GERD without esophagitis 06/08/2019  . Age-related osteoporosis without current pathological fracture 06/08/2019  . Hypothyroidism due to acquired atrophy of thyroid 11/02/2018  . Vitamin D deficiency 02/16/2018  . Tingling in extremities 02/16/2018  . Myalgia 02/02/2018  . Adenomatous colon polyp 10/07/2017  . 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 Seasonal allergies 05/13/2016  . Degenerative arthritis of knee, bilateral 11/26/2015  . Asthma 11/28/2014    Allergies  Allergen Reactions  . Aspirin Hives  . Penicillins Other (See Comments)  . Dairy Aid [Lactase] Diarrhea    Past Surgical History:  Procedure Laterality Date  . 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 BREAST BIOPSY Bilateral 1971   benign  .  BREAST SURGERY    . COLONOSCOPY  03/19/2012   tubular adenoma - repeat 5 years Dr. Vira Agar  . KNEE SURGERY     five different surgery    Social History   Tobacco Use  . Smoking status: Current Some Day Smoker    Packs/day: 0.15    Years: 40.00    Pack years: 6.00    Types:  Cigarettes  . Smokeless tobacco: Never Used  . Tobacco comment: 2-3 cigarettes daily  Vaping Use  . Vaping Use: Never used  Substance Use Topics  . Alcohol use: No  . Drug use: No     Medication list has been reviewed and updated.  Current Meds  Medication Sig  . albuterol (VENTOLIN HFA) 108 (90 Base) MCG/ACT inhaler INHALE 2 PUFFS BY MOUTH EVERY 6 HOURS ASNEEDED WHEEZING/ SHORTNESS OF BREATH (Patient taking differently: Inhale 2 puffs into the lungs every 4 (four) hours as needed. INHALE 2 PUFFS BY MOUTH EVERY 6 HOURS ASNEEDED WHEEZING/ SHORTNESS OF BREATH VENTOLIN BRAND NAME ONLY)  . cetirizine (ZYRTEC) 10 MG tablet Take 1 tablet (10 mg total) by mouth daily.  . ferrous sulfate 325 (65 FE) MG tablet Take 325 mg by mouth daily with breakfast.  . gabapentin (NEURONTIN) 100 MG capsule TAKE 2 CAPSULES BY MOUTH TWICE DAILY  . levothyroxine (SYNTHROID) 75 MCG tablet TAKE 1 TABLET BY MOUTH ONCE DAILY ON AN EMPTY STOMACH. WAIT 30 MINUTES BEFORE TAKING OTHER MEDS.  . Multiple Vitamins-Minerals (MULTIPLE VITAMINS/WOMENS PO) Take by mouth.  . SYMBICORT 160-4.5 MCG/ACT inhaler INHALE 2 PUFFS BY MOUTH TWICE DAILY    PHQ 2/9 Scores 08/09/2020 04/25/2020 06/08/2019 05/23/2019  PHQ - 2 Score 0 0 0 2  PHQ- 9 Score 0 - 0 3    GAD 7 : Generalized Anxiety Score 08/09/2020  Nervous, Anxious, on Edge 0  Control/stop worrying 0  Worry too much - different things 0  Trouble relaxing 0  Restless 0  Easily annoyed or irritable 0  Afraid - awful might happen 0  Total GAD 7 Score 0  Anxiety Difficulty Not difficult at all    BP Readings from Last 3 Encounters:  08/09/20 102/64  04/25/20 108/62  02/01/20 118/68    Physical Exam Vitals and nursing note reviewed.  Constitutional:      General: She is not in acute distress.    Appearance: She is well-developed.  HENT:     Head: Normocephalic and atraumatic.  Neck:     Vascular: No carotid bruit.  Cardiovascular:     Rate and Rhythm: Normal  rate and regular rhythm.     Pulses: Normal pulses.     Heart sounds: No murmur heard.   Pulmonary:     Effort: Pulmonary effort is normal. No respiratory distress.     Breath sounds: No wheezing or rhonchi.  Musculoskeletal:     Cervical back: Normal range of motion.     Right lower leg: No edema.     Left lower leg: No edema.  Lymphadenopathy:     Cervical: No cervical adenopathy.  Skin:    General: Skin is warm and dry.     Findings: No rash.  Neurological:     General: No focal deficit present.     Mental Status: She is alert and oriented to person, place, and time.  Psychiatric:        Mood and Affect: Mood normal.        Behavior: Behavior normal.     Wt Readings  from Last 3 Encounters:  08/09/20 119 lb (54 kg)  04/25/20 118 lb 9.6 oz (53.8 kg)  02/01/20 118 lb (53.5 kg)    BP 102/64   Pulse 68   Ht 5\' 4"  (1.626 m)   Wt 119 lb (54 kg)   SpO2 97%   BMI 20.43 kg/m   Assessment and Plan: 1. Hypothyroidism due to acquired atrophy of thyroid Supplemented - last labs normal - levothyroxine (SYNTHROID) 75 MCG tablet; Take 1 tablet (75 mcg total) by mouth daily before breakfast.  Dispense: 90 tablet; Refill: 3  2. Mild intermittent asthma, unspecified whether complicated Continue Symbicort daily with prn albuterol No recent URI or worsening sx Follow up as needed  3. GERD without esophagitis Symptoms well controlled on daily pepcid. No red flag signs such as weight loss, n/v, melena  4. Age-related osteoporosis without current pathological fracture Started Reclast last year. Doing well - had minimal side effects. Next infusion in April.   Partially dictated using Editor, commissioning. Any errors are unintentional.  Halina Maidens, MD [facility]  08/09/2020

## 2020-08-28 DIAGNOSIS — M810 Age-related osteoporosis without current pathological fracture: Secondary | ICD-10-CM | POA: Diagnosis not present

## 2020-08-28 DIAGNOSIS — Z8639 Personal history of other endocrine, nutritional and metabolic disease: Secondary | ICD-10-CM | POA: Diagnosis not present

## 2020-09-01 ENCOUNTER — Other Ambulatory Visit: Payer: Self-pay | Admitting: Internal Medicine

## 2020-09-01 DIAGNOSIS — J4520 Mild intermittent asthma, uncomplicated: Secondary | ICD-10-CM

## 2020-09-01 NOTE — Telephone Encounter (Signed)
Future OV 11/29/20 Approved per protocol.  Requested Prescriptions  Pending Prescriptions Disp Refills  . SYMBICORT 160-4.5 MCG/ACT inhaler [Pharmacy Med Name: SYMBICORT 160-4.5 MCG/ACT INH AERO] 10.2 g 2    Sig: INHALE 2 PUFFS TWICE A DAY RINSE MOUTH WITH WATER AFTER EACH USE     Pulmonology:  Combination Products Passed - 09/01/2020  9:50 AM      Passed - Valid encounter within last 12 months    Recent Outpatient Visits          3 weeks ago Mild intermittent asthma, unspecified whether complicated   [facility] Glean Hess, MD   7 months ago Mild ankle edema   [facility] Glean Hess, MD   1 year ago Age-related osteoporosis without current pathological fracture   [facility] Glean Hess, MD   1 year ago Gastroesophageal reflux disease, unspecified whether esophagitis present   [facility] Glean Hess, MD   1 year ago Strain of right knee, initial encounter   [facility] Glean Hess, MD      Future Appointments            In 2 months Army Melia Jesse Sans, MD [facility], [facility]

## 2020-09-04 DIAGNOSIS — Z8639 Personal history of other endocrine, nutritional and metabolic disease: Secondary | ICD-10-CM | POA: Diagnosis not present

## 2020-09-04 DIAGNOSIS — M810 Age-related osteoporosis without current pathological fracture: Secondary | ICD-10-CM | POA: Diagnosis not present

## 2020-09-11 DIAGNOSIS — Z23 Encounter for immunization: Secondary | ICD-10-CM | POA: Diagnosis not present

## 2020-09-28 DIAGNOSIS — M810 Age-related osteoporosis without current pathological fracture: Secondary | ICD-10-CM | POA: Diagnosis not present

## 2020-10-02 ENCOUNTER — Other Ambulatory Visit: Payer: Self-pay | Admitting: Internal Medicine

## 2020-10-02 DIAGNOSIS — J4520 Mild intermittent asthma, uncomplicated: Secondary | ICD-10-CM

## 2020-10-02 NOTE — Telephone Encounter (Signed)
Requested Prescriptions  Pending Prescriptions Disp Refills  . VENTOLIN HFA 108 (90 Base) MCG/ACT inhaler [Pharmacy Med Name: VENTOLIN HFA 108 (90 BASE) MCG/ACT] 8.5 g 0    Sig: INHALE 2 PUFFS INTO THE LUNGS EVERY 6 HOURS AS NEEDED FOR WHEEZING/SHORTNESS OF BREATH     Pulmonology:  Beta Agonists Failed - 10/02/2020  5:12 PM      Failed - One inhaler should last at least one month. If the patient is requesting refills earlier, contact the patient to check for uncontrolled symptoms.      Passed - Valid encounter within last 12 months    Recent Outpatient Visits          1 month ago Mild intermittent asthma, unspecified whether complicated   [facility] Glean Hess, MD   8 months ago Mild ankle edema   [facility] Glean Hess, MD   1 year ago Age-related osteoporosis without current pathological fracture   [facility] Glean Hess, MD   1 year ago Gastroesophageal reflux disease, unspecified whether esophagitis present   [facility] Glean Hess, MD   1 year ago Strain of right knee, initial encounter   [facility] Glean Hess, MD      Future Appointments            In 1 month Army Melia Jesse Sans, MD [facility], [facility]

## 2020-10-12 ENCOUNTER — Other Ambulatory Visit: Payer: Self-pay

## 2020-10-12 ENCOUNTER — Ambulatory Visit (INDEPENDENT_AMBULATORY_CARE_PROVIDER_SITE_OTHER): Payer: Medicare Other

## 2020-10-12 ENCOUNTER — Ambulatory Visit
Admission: EM | Admit: 2020-10-12 | Discharge: 2020-10-12 | Disposition: A | Discharge status: Home / Self Care | Payer: Medicare Other | Attending: Family Medicine | Admitting: Family Medicine

## 2020-10-12 DIAGNOSIS — R0781 Pleurodynia: Secondary | ICD-10-CM | POA: Diagnosis not present

## 2020-10-12 DIAGNOSIS — R0789 Other chest pain: Secondary | ICD-10-CM | POA: Diagnosis not present

## 2020-10-12 DIAGNOSIS — S27321A Contusion of lung, unilateral, initial encounter: Secondary | ICD-10-CM

## 2020-10-12 DIAGNOSIS — R918 Other nonspecific abnormal finding of lung field: Secondary | ICD-10-CM | POA: Diagnosis not present

## 2020-10-12 DIAGNOSIS — S299XXA Unspecified injury of thorax, initial encounter: Secondary | ICD-10-CM | POA: Diagnosis not present

## 2020-10-12 NOTE — Discharge Instructions (Addendum)
Tylenol, ice and heat.  Take care  Dr. Lacinda Axon

## 2020-10-12 NOTE — ED Provider Notes (Signed)
[facility]    CSN: [id number] Arrival date & time: 10/12/20  1011      History   Chief Complaint Chief Complaint  Patient presents with  . Rib Injury   HPI  75 year old female presents with the above complaint.  Patient states that her husband was taking her this morning.  He subsequently hugged her and picked her up and she felt a pop of one of the ribs on the right side.  She has had ongoing pain since that time.  The area is just below the breast in the mid clavicular line.  Pain 7/10 in severity.  Described as achy.  No relieving factors.  She is concerned about the possibility of fracture.  Patient has known osteoporosis.  No other complaints at this time.  Past Medical History:  Diagnosis Date  . Allergy   . Asthma    mild intermittent due to allergies.  . Osteoarthritis   . Thyroid disease     Patient Active Problem List   Diagnosis Date Noted  . Mild ankle edema 02/01/2020  . Helicobacter pylori infection 07/01/2019  . GERD without esophagitis 06/08/2019  . Age-related osteoporosis without current pathological fracture 06/08/2019  . Hypothyroidism due to acquired atrophy of thyroid 11/02/2018  . Vitamin D deficiency 02/16/2018  . Tingling in extremities 02/16/2018  . Myalgia 02/02/2018  . Adenomatous colon polyp 10/07/2017  . 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 Seasonal allergies 05/13/2016  . Degenerative arthritis of knee, bilateral 11/26/2015  . Asthma 11/28/2014    Past Surgical History:  Procedure Laterality Date  . 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 BREAST BIOPSY Bilateral 1971   benign  . BREAST SURGERY    . COLONOSCOPY  03/19/2012   tubular adenoma - repeat 5 years Dr. Vira Agar  . KNEE SURGERY     five different surgery    OB History   No obstetric history on file.      Home Medications    Prior to Admission medications   Medication Sig Start Date End Date Taking? Authorizing  Provider  cetirizine (ZYRTEC) 10 MG tablet Take 1 tablet (10 mg total) by mouth daily. 07/06/17  Yes Glean Hess, MD  famotidine (PEPCID) 10 MG tablet Take 10 mg by mouth daily.   Yes [provider]  ferrous sulfate 325 (65 FE) MG tablet Take 325 mg by mouth daily with breakfast.   Yes [provider]  gabapentin (NEURONTIN) 100 MG capsule TAKE 2 CAPSULES BY MOUTH TWICE DAILY 07/31/20  Yes Glean Hess, MD  levothyroxine (SYNTHROID) 75 MCG tablet Take 1 tablet (75 mcg total) by mouth daily before breakfast. 08/09/20  Yes Glean Hess, MD  Multiple Vitamins-Minerals (MULTIPLE VITAMINS/WOMENS PO) Take by mouth.   Yes [provider]  SYMBICORT 160-4.5 MCG/ACT inhaler INHALE 2 PUFFS TWICE A DAY RINSE MOUTH WITH WATER AFTER EACH USE 09/01/20  Yes Glean Hess, MD  VENTOLIN HFA 108 (90 Base) MCG/ACT inhaler INHALE 2 PUFFS INTO THE LUNGS EVERY 6 HOURS AS NEEDED FOR WHEEZING/SHORTNESS OF BREATH 10/02/20  Yes Glean Hess, MD  Zoledronic Acid (RECLAST IV) Inject into the vein.   Yes [provider]    Family History Family History  Problem Relation Age of Onset  . Depression Mother   . Hearing loss Mother   . Mental illness Mother   . Miscarriages / [place] Mother   . Asthma Father   .  Kidney disease Father   . Stroke Father   . Varicose Veins Father   . Breast cancer Neg Hx     Social History Social History   Tobacco Use  . Smoking status: Current Some Day Smoker    Packs/day: 0.15    Years: 40.00    Pack years: 6.00    Types: Cigarettes  . Smokeless tobacco: Never Used  . Tobacco comment: 2-3 cigarettes daily  Vaping Use  . Vaping Use: Never used  Substance Use Topics  . Alcohol use: No  . Drug use: No     Allergies   Aspirin, Penicillins, and Dairy aid [lactase]   Review of Systems Review of Systems  Constitutional: Negative.   Musculoskeletal:       Rib pain.   Physical Exam Triage Vital Signs ED Triage  Vitals  Enc Vitals Group     BP 10/12/20 1029 119/78     Pulse Rate 10/12/20 1029 80     Resp 10/12/20 1029 17     Temp 10/12/20 1029 98.5 F (36.9 C)     Temp Source 10/12/20 1029 Oral     SpO2 10/12/20 1029 99 %     Weight 10/12/20 1025 119 lb (54 kg)     Height 10/12/20 1027 5\' 4"  (1.626 m)     Head Circumference --      Peak Flow --      Pain Score 10/12/20 1027 7     Pain Loc --      Pain Edu? --      Excl. in [place]? --    No data found.  Updated Vital Signs BP 119/78 (BP Location: Left Arm)   Pulse 80   Temp 98.5 F (36.9 C) (Oral)   Resp 17   Ht 5\' 4"  (1.626 m)   Wt 54 kg   SpO2 99%   BMI 20.43 kg/m   Visual Acuity Right Eye Distance:   Left Eye Distance:   Bilateral Distance:    Right Eye Near:   Left Eye Near:    Bilateral Near:     Physical Exam Vitals and nursing note reviewed. Exam conducted with a chaperone present.  Constitutional:      General: She is not in acute distress.    Appearance: Normal appearance. She is not ill-appearing.  HENT:     Head: Normocephalic and atraumatic.  Eyes:     General:        Right eye: No discharge.        Left eye: No discharge.     Conjunctiva/sclera: Conjunctivae normal.  Pulmonary:     Effort: Pulmonary effort is normal. No respiratory distress.  Chest:       Comments: Labeled location is tender to palpation. Lymphadenopathy:     Cervical: No cervical adenopathy.  Neurological:     Mental Status: She is alert.  Psychiatric:        Mood and Affect: Mood normal.        Behavior: Behavior normal.      UC Treatments / Results  Labs (all labs ordered are listed, but only abnormal results are displayed) Labs Reviewed - No data to display  EKG   Radiology DG Ribs Unilateral W/Chest Right  Result Date: 10/12/2020 CLINICAL DATA:  Rib injury. Patient felt a snap in right chest area. Continuous pain. EXAM: RIGHT RIBS AND CHEST - 3+ VIEW COMPARISON:  CT chest 03/05/2018. FINDINGS: No displaced fracture  or other bone lesions are seen  involving the ribs. There is no evidence of pneumothorax or pleural effusion. Opacity within the right lung base is identified which may represent pneumonia or in the setting of recent injury potentially a pulmonary contusion. Heart size and mediastinal contours are within normal limits. IMPRESSION: 1. No displaced rib fractures identified. 2. Right base opacity which may represent pneumonia or pulmonary contusion. Electronically Signed   By: Kerby Moors M.D.   On: 10/12/2020 11:27    Procedures Procedures (including critical care time)  Medications Ordered in UC Medications - No data to display  Initial Impression / Assessment and Plan / UC Course  I have reviewed the triage vital signs and the nursing notes.  Pertinent labs & imaging results that were available during my care of the patient were reviewed by me and considered in my medical decision making (see chart for details).    75 year old female presents with a rib injury.  X-ray revealed findings consistent with pulmonary contusion.  Offered pain medication and patient declined.  Advised over-the-counter Tylenol, 1000 mg 3 times daily as needed.  Advised heat.  Supportive care.  Final Clinical Impressions(s) / UC Diagnoses   Final diagnoses:  Contusion of right lung, initial encounter     Discharge Instructions     Tylenol, ice and heat.  Take care  Dr. Lacinda Axon     ED Prescriptions    None     PDMP not reviewed this encounter.   Coral Spikes, [place] 10/12/20 1528

## 2020-10-12 NOTE — ED Triage Notes (Addendum)
Patient states that her and her husband were tickling and hugging each other on Monday and he picked her up and she felt a snap in her right chest area. States that pain has been continuous since then. Patient concerned that she may have a broken rib

## 2020-11-01 ENCOUNTER — Other Ambulatory Visit: Payer: Self-pay | Admitting: Internal Medicine

## 2020-11-01 DIAGNOSIS — J4520 Mild intermittent asthma, uncomplicated: Secondary | ICD-10-CM

## 2020-11-01 DIAGNOSIS — R600 Localized edema: Secondary | ICD-10-CM

## 2020-11-01 NOTE — Telephone Encounter (Signed)
Requested medication (s) are due for refill today:   Requested medication (s) are on the active medication list: No  Last refill:  12/31/18  Future visit scheduled: Yes  Notes to clinic:  Medication not on list.    Requested Prescriptions  Pending Prescriptions Disp Refills   hydrochlorothiazide (HYDRODIURIL) 25 MG tablet [Pharmacy Med Name: HYDROCHLOROTHIAZIDE 25 MG TAB] 30 tablet 5    Sig: TAKE 1/2 TABLET BY MOUTH ONCE DAILY AS NEEDED      Cardiovascular: Diuretics - Thiazide Failed - 11/01/2020 11:37 AM      Failed - Ca in normal range and within 360 days    Calcium  Date Value Ref Range Status  06/08/2019 9.3 8.7 - 10.3 mg/dL Final   Calcium, Total  Date Value Ref Range Status  03/08/2014 9.6 8.5 - 10.1 mg/dL Final          Passed - Cr in normal range and within 360 days    Creatinine  Date Value Ref Range Status  11/29/2019 0.8 0.5 - 1.1 Final   Creat  Date Value Ref Range Status  09/02/2016 0.65 0.50 - 0.99 mg/dL Final    Comment:      For patients > or = 73 years of age: The upper reference limit for Creatinine is approximately 13% higher for people identified as African-American.      Creatinine, Ser  Date Value Ref Range Status  06/08/2019 0.79 0.57 - 1.00 mg/dL Final          Passed - K in normal range and within 360 days    Potassium  Date Value Ref Range Status  11/29/2019 4.7 3.4 - 5.3 Final  03/08/2014 3.9 3.5 - 5.1 mmol/L Final          Passed - Na in normal range and within 360 days    Sodium  Date Value Ref Range Status  11/29/2019 142 137 - 147 Final  03/08/2014 139 136 - 145 mmol/L Final          Passed - Last BP in normal range    BP Readings from Last 1 Encounters:  10/12/20 119/78          Passed - Valid encounter within last 6 months    Recent Outpatient Visits           2 months ago Mild intermittent asthma, unspecified whether complicated   [facility] Glean Hess, MD   9 months ago Mild ankle edema    [facility] Glean Hess, MD   1 year ago Age-related osteoporosis without current pathological fracture   [facility] Glean Hess, MD   1 year ago Gastroesophageal reflux disease, unspecified whether esophagitis present   [facility] Glean Hess, MD   1 year ago Strain of right knee, initial encounter   [facility] Glean Hess, MD       Future Appointments             In 1 month Army Melia Jesse Sans, MD [facility], [facility]              Signed Prescriptions Disp Refills   VENTOLIN HFA 108 (90 Base) MCG/ACT inhaler 18 g 0    Sig: INHALE 2 PUFFS INTO THE LUNGS EVERY 6 HOURS AS NEEDED FOR WHEEZING/SHORTNESS OF BREATH      Pulmonology:  Beta Agonists Failed - 11/01/2020 11:37 AM      Failed - One inhaler should  last at least one month. If the patient is requesting refills earlier, contact the patient to check for uncontrolled symptoms.      Passed - Valid encounter within last 12 months    Recent Outpatient Visits           2 months ago Mild intermittent asthma, unspecified whether complicated   [facility] Glean Hess, MD   9 months ago Mild ankle edema   [facility] Glean Hess, MD   1 year ago Age-related osteoporosis without current pathological fracture   [facility] Glean Hess, MD   1 year ago Gastroesophageal reflux disease, unspecified whether esophagitis present   [facility] Glean Hess, MD   1 year ago Strain of right knee, initial encounter   [facility] Glean Hess, MD       Future Appointments             In 1 month Army Melia Jesse Sans, MD [facility], [facility]

## 2020-11-07 ENCOUNTER — Other Ambulatory Visit: Payer: Self-pay | Admitting: Internal Medicine

## 2020-11-07 DIAGNOSIS — R600 Localized edema: Secondary | ICD-10-CM

## 2020-11-07 NOTE — Telephone Encounter (Signed)
   Notes to clinic: medication requested is not on current list  Review for continue use and refill    Requested Prescriptions  Pending Prescriptions Disp Refills   hydrochlorothiazide (HYDRODIURIL) 25 MG tablet [Pharmacy Med Name: HYDROCHLOROTHIAZIDE 25 MG TAB] 30 tablet 5    Sig: TAKE 1/2 TABLET BY MOUTH ONCE DAILY AS NEEDED      Cardiovascular: Diuretics - Thiazide Failed - 11/07/2020  2:10 PM      Failed - Ca in normal range and within 360 days    Calcium  Date Value Ref Range Status  06/08/2019 9.3 8.7 - 10.3 mg/dL Final   Calcium, Total  Date Value Ref Range Status  03/08/2014 9.6 8.5 - 10.1 mg/dL Final          Passed - Cr in normal range and within 360 days    Creatinine  Date Value Ref Range Status  11/29/2019 0.8 0.5 - 1.1 Final   Creat  Date Value Ref Range Status  09/02/2016 0.65 0.50 - 0.99 mg/dL Final    Comment:      For patients > or = 73 years of age: The upper reference limit for Creatinine is approximately 13% higher for people identified as African-American.      Creatinine, Ser  Date Value Ref Range Status  06/08/2019 0.79 0.57 - 1.00 mg/dL Final          Passed - K in normal range and within 360 days    Potassium  Date Value Ref Range Status  11/29/2019 4.7 3.4 - 5.3 Final  03/08/2014 3.9 3.5 - 5.1 mmol/L Final          Passed - Na in normal range and within 360 days    Sodium  Date Value Ref Range Status  11/29/2019 142 137 - 147 Final  03/08/2014 139 136 - 145 mmol/L Final          Passed - Last BP in normal range    BP Readings from Last 1 Encounters:  10/12/20 119/78          Passed - Valid encounter within last 6 months    Recent Outpatient Visits           3 months ago Mild intermittent asthma, unspecified whether complicated   [facility] Glean Hess, MD   9 months ago Mild ankle edema   [facility] Glean Hess, MD   1 year ago Age-related osteoporosis without current pathological  fracture   [facility] Glean Hess, MD   1 year ago Gastroesophageal reflux disease, unspecified whether esophagitis present   [facility] Glean Hess, MD   1 year ago Strain of right knee, initial encounter   [facility] Glean Hess, MD       Future Appointments             In 1 month Army Melia, Jesse Sans, MD [facility], [facility]

## 2020-11-13 ENCOUNTER — Other Ambulatory Visit: Payer: Self-pay | Admitting: Internal Medicine

## 2020-11-13 DIAGNOSIS — J4520 Mild intermittent asthma, uncomplicated: Secondary | ICD-10-CM

## 2020-11-13 NOTE — Telephone Encounter (Signed)
Requested Prescriptions  Pending Prescriptions Disp Refills  . VENTOLIN HFA 108 (90 Base) MCG/ACT inhaler [Pharmacy Med Name: VENTOLIN HFA 108 (90 BASE) MCG/ACT] 18 g 0    Sig: INHALE 2 PUFFS INTO THE LUNGS EVERY 6 HOURS AS NEEDED FOR WHEEZING/SHORTNESS OF BREATH     Pulmonology:  Beta Agonists Failed - 11/13/2020  8:56 PM      Failed - One inhaler should last at least one month. If the patient is requesting refills earlier, contact the patient to check for uncontrolled symptoms.      Passed - Valid encounter within last 12 months    Recent Outpatient Visits          3 months ago Mild intermittent asthma, unspecified whether complicated   [facility] Glean Hess, MD   9 months ago Mild ankle edema   [facility] Glean Hess, MD   1 year ago Age-related osteoporosis without current pathological fracture   [facility] Glean Hess, MD   1 year ago Gastroesophageal reflux disease, unspecified whether esophagitis present   [facility] Glean Hess, MD   1 year ago Strain of right knee, initial encounter   [facility] Glean Hess, MD      Future Appointments            In 1 month Army Melia Jesse Sans, MD [facility], [facility]

## 2020-11-29 ENCOUNTER — Encounter: Payer: Medicare Other | Admitting: Internal Medicine

## 2020-12-12 ENCOUNTER — Other Ambulatory Visit: Payer: Self-pay | Admitting: Internal Medicine

## 2020-12-12 DIAGNOSIS — J4520 Mild intermittent asthma, uncomplicated: Secondary | ICD-10-CM

## 2020-12-12 DIAGNOSIS — R202 Paresthesia of skin: Secondary | ICD-10-CM

## 2020-12-17 ENCOUNTER — Other Ambulatory Visit: Payer: Self-pay

## 2020-12-17 ENCOUNTER — Encounter: Payer: Self-pay | Admitting: Internal Medicine

## 2020-12-17 ENCOUNTER — Ambulatory Visit (INDEPENDENT_AMBULATORY_CARE_PROVIDER_SITE_OTHER): Payer: Medicare Other | Admitting: Internal Medicine

## 2020-12-17 VITALS — BP 116/78 | HR 62 | Ht 64.0 in | Wt 117.0 lb

## 2020-12-17 DIAGNOSIS — E034 Atrophy of thyroid (acquired): Secondary | ICD-10-CM

## 2020-12-17 DIAGNOSIS — K219 Gastro-esophageal reflux disease without esophagitis: Secondary | ICD-10-CM | POA: Diagnosis not present

## 2020-12-17 DIAGNOSIS — M810 Age-related osteoporosis without current pathological fracture: Secondary | ICD-10-CM

## 2020-12-17 DIAGNOSIS — E782 Mixed hyperlipidemia: Secondary | ICD-10-CM

## 2020-12-17 DIAGNOSIS — R600 Localized edema: Secondary | ICD-10-CM | POA: Diagnosis not present

## 2020-12-17 DIAGNOSIS — J4520 Mild intermittent asthma, uncomplicated: Secondary | ICD-10-CM

## 2020-12-17 DIAGNOSIS — Z0000 Encounter for general adult medical examination without abnormal findings: Secondary | ICD-10-CM

## 2020-12-17 MED ORDER — FUROSEMIDE 20 MG PO TABS: 20.0000 mg | ORAL_TABLET | Freq: Every day | ORAL | 0 refills | Status: DC | PRN

## 2020-12-17 NOTE — Progress Notes (Signed)
Date:  12/17/2020   Name:  Lindsey Golden   DOB:  Jan 09, 1946   MRN:  [id number]   Chief Complaint: Annual Exam (Breast exam no pap ) Lindsey Golden is a 75 y.o. female who presents today for her Complete Annual Exam. She feels well. She reports exercising stretching X3-4 times a week . She reports she is sleeping well. Breast complaints none.  Mammogram: 07/2020 DEXA: 05/2019 osteoporosis of hip - on Reclast through Endo Colonoscopy: 01/2018  Immunization History  Administered Date(s) Administered   Influenza, High Dose Seasonal PF 04/05/2015, 03/18/2016, 03/06/2017, 03/18/2019, 04/16/2020   Influenza-Unspecified 03/24/2019   PFIZER(Purple Top)SARS-COV-2 Vaccination 07/30/2019, 08/21/2019, 03/27/2020, 09/11/2020   Pneumococcal Conjugate-13 03/08/2014   Pneumococcal Polysaccharide-23 09/17/2012   Tdap 09/29/2014   Zoster Recombinat (Shingrix) 07/08/2018, 12/14/2018    Thyroid Problem Presents for follow-up visit. Patient reports no anxiety, constipation, diarrhea, fatigue, palpitations or tremors. The symptoms have been stable.  Gastroesophageal Reflux She complains of heartburn. She reports no abdominal pain, no chest pain, no coughing or no wheezing. This is a recurrent problem. The problem occurs rarely. Pertinent negatives include no fatigue. She has tried a histamine-2 antagonist for the symptoms. The treatment provided significant relief.  Asthma She complains of shortness of breath. There is no cough or wheezing. This is a recurrent problem. The problem occurs intermittently. Associated symptoms include heartburn. Pertinent negatives include no chest pain, fever, headaches or trouble swallowing. Her symptoms are alleviated by beta-agonist and steroid inhaler. Her past medical history is significant for asthma.   Lab Results  Component Value Date   CREATININE 0.8 11/29/2019   BUN 13 11/29/2019   NA 142 11/29/2019   K 4.7 11/29/2019   CL 102 06/08/2019    CO2 24 06/08/2019   Lab Results  Component Value Date   CHOL 197 10/07/2017   HDL 89 10/07/2017   LDLCALC 87 10/07/2017   TRIG 105 10/07/2017   CHOLHDL 2.2 10/07/2017   Lab Results  Component Value Date   TSH 0.623 02/01/2020   Lab Results  Component Value Date   HGBA1C 5.0 09/02/2016   Lab Results  Component Value Date   WBC 11.0 (H) 06/08/2019   HGB 14.5 06/08/2019   HCT 42.7 06/08/2019   MCV 96 06/08/2019   PLT 282 06/08/2019   Lab Results  Component Value Date   ALT 9 06/08/2019   AST 20 06/08/2019   ALKPHOS 88 06/08/2019   BILITOT 0.4 06/08/2019     Review of Systems  Constitutional:  Negative for chills, fatigue and fever.  HENT:  Negative for congestion, hearing loss, tinnitus, trouble swallowing and voice change.   Eyes:  Negative for visual disturbance.  Respiratory:  Positive for shortness of breath. Negative for cough, chest tightness and wheezing.   Cardiovascular:  Negative for chest pain, palpitations and leg swelling.  Gastrointestinal:  Positive for heartburn. Negative for abdominal pain, constipation, diarrhea and vomiting.  Endocrine: Negative for polydipsia and polyuria.  Genitourinary:  Negative for dysuria, frequency, genital sores, vaginal bleeding and vaginal discharge.  Musculoskeletal:  Negative for arthralgias, gait problem and joint swelling.  Skin:  Negative for color change and rash.  Neurological:  Negative for dizziness, tremors, light-headedness and headaches.  Hematological:  Negative for adenopathy. Does not bruise/bleed easily.  Psychiatric/Behavioral:  Negative for dysphoric mood and sleep disturbance. The patient is not nervous/anxious.    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 Vira Agar   KNEE SURGERY     five different surgery    Social History   Tobacco Use   Smoking status: Some Days    Packs/day: 0.15    Years: 40.00    Pack years: 6.00    Types: Cigarettes   Smokeless tobacco: Never   Tobacco comments:    2-3 cigarettes daily  Vaping Use   Vaping Use: Never used  Substance Use Topics   Alcohol use: No   Drug use: No     Medication list has been reviewed and updated.  Current Meds  Medication Sig   cetirizine (ZYRTEC) 10 MG tablet Take 1 tablet (10 mg total) by mouth daily.   famotidine (PEPCID) 10 MG tablet Take 10 mg by mouth daily.   ferrous sulfate 325 (65 FE) MG tablet Take 325 mg by mouth daily with breakfast.   gabapentin (NEURONTIN) 100 MG capsule TAKE 2 CAPSULES BY MOUTH TWICE DAILY   levothyroxine (SYNTHROID) 75 MCG tablet Take 1 tablet (75 mcg total) by mouth daily before breakfast.   Multiple Vitamins-Minerals (MULTIPLE VITAMINS/WOMENS PO) Take by mouth.   SYMBICORT 160-4.5 MCG/ACT inhaler INHALE 2 PUFFS TWICE A DAY RINSE MOUTH WITH WATER AFTER EACH USE   VENTOLIN HFA 108 (90 Base) MCG/ACT inhaler INHALE 2 PUFFS INTO THE LUNGS EVERY 6 HOURS AS NEEDED FOR WHEEZING/SHORTNESS OF BREATH   Zoledronic Acid (RECLAST IV) Inject into the vein.    PHQ 2/9 Scores 12/17/2020  08/09/2020 04/25/2020 06/08/2019  PHQ - 2 Score 0 0 0 0  PHQ- 9 Score 0 0 - 0    GAD 7 : Generalized Anxiety Score 12/17/2020 08/09/2020  Nervous, Anxious, on Edge 0 0  Control/stop worrying 0 0  Worry too much - different things 0 0  Trouble relaxing 0 0  Restless 0 0  Easily annoyed or irritable 0 0  Afraid - awful might happen 0 0  Total GAD 7 Score 0 0  Anxiety Difficulty Not difficult at all Not difficult at all    BP Readings from Last 3 Encounters:  12/17/20 116/78  10/12/20 119/78  08/09/20 102/64    Physical Exam Vitals and nursing note reviewed.  Constitutional:      General: She is not in acute distress.    Appearance: She is well-developed.  HENT:     Head: Normocephalic and atraumatic.     Right Ear: Tympanic membrane and ear canal normal.     Left Ear: Tympanic membrane and ear canal normal.     Nose:     Right Sinus: No maxillary sinus tenderness.  Left Sinus: No maxillary sinus tenderness.  Eyes:     General: No scleral icterus.       Right eye: No discharge.        Left eye: No discharge.     Conjunctiva/sclera: Conjunctivae normal.  Neck:     Thyroid: No thyromegaly.     Vascular: No carotid bruit.  Cardiovascular:     Rate and Rhythm: Normal rate and regular rhythm.     Pulses: Normal pulses.     Heart sounds: Normal heart sounds.  Pulmonary:     Effort: Pulmonary effort is normal. No respiratory distress.     Breath sounds: No wheezing.  Chest:  Breasts:    Right: No mass, nipple discharge, skin change or tenderness.     Left: No mass, nipple discharge, skin change or tenderness.  Abdominal:     General: Bowel sounds are normal.     Palpations: Abdomen is soft.     Tenderness: There is no abdominal tenderness.  Musculoskeletal:     Cervical back: Normal range of motion. No erythema.     Right lower leg: No edema.     Left lower leg: No edema.  Lymphadenopathy:     Cervical: No cervical adenopathy.  Skin:    General: Skin is warm and  dry.     Findings: No rash.  Neurological:     Mental Status: She is alert and oriented to person, place, and time.     Cranial Nerves: No cranial nerve deficit.     Sensory: No sensory deficit.     Deep Tendon Reflexes: Reflexes are normal and symmetric.  Psychiatric:        Attention and Perception: Attention normal.        Mood and Affect: Mood normal.    Wt Readings from Last 3 Encounters:  12/17/20 117 lb (53.1 kg)  10/12/20 119 lb (54 kg)  08/09/20 119 lb (54 kg)    BP 116/78   Pulse 62   Ht 5\' 4"  (1.626 m)   Wt 117 lb (53.1 kg)   SpO2 94%   BMI 20.08 kg/m   Assessment and Plan: 1. Hypothyroidism due to acquired atrophy of thyroid Supplemented - check labs and advise - TSH + free T4  2. GERD without esophagitis Symptoms well controlled on daily PPI No red flag signs such as weight loss, n/v, melena Will continue pepcid. - CBC with Differential/Platelet - Comprehensive metabolic panel  3. Mild intermittent asthma, unspecified whether complicated Stable mild persistent sx on Symbicort and Albuterol  4. Age-related osteoporosis without current pathological fracture Continue treatment with Reclast by Endocrinology  5. Mixed hyperlipidemia Check labs and advise - Lipid panel  6. Lower leg edema Intermittent symptoms worse in the summer Continue adequate hydration, elevation and can take 10 mg furosemide PRN - furosemide (LASIX) 20 MG tablet; Take 1 tablet (20 mg total) by mouth daily as needed.  Dispense: 30 tablet; Refill: 0  7. Health care maintenance Screenings and immunizations are up to date   Partially dictated using Dragon software. Any errors are unintentional.  Halina Maidens, MD [facility]  12/17/2020

## 2020-12-18 LAB — CBC WITH DIFFERENTIAL/PLATELET
Basophils Absolute: 0.1 10*3/uL (ref 0.0–0.2)
Basos: 1 %
EOS (ABSOLUTE): 0.1 10*3/uL (ref 0.0–0.4)
Eos: 1 %
Hematocrit: 43.3 % (ref 34.0–46.6)
Hemoglobin: 14.3 g/dL (ref 11.1–15.9)
Immature Grans (Abs): 0 10*3/uL (ref 0.0–0.1)
Immature Granulocytes: 0 %
Lymphocytes Absolute: 3.3 10*3/uL — ABNORMAL HIGH (ref 0.7–3.1)
Lymphs: 30 %
MCH: 32 pg (ref 26.6–33.0)
MCHC: 33 g/dL (ref 31.5–35.7)
MCV: 97 fL (ref 79–97)
Monocytes Absolute: 0.6 10*3/uL (ref 0.1–0.9)
Monocytes: 6 %
Neutrophils Absolute: 6.8 10*3/uL (ref 1.4–7.0)
Neutrophils: 62 %
Platelets: 237 10*3/uL (ref 150–450)
RBC: 4.47 x10E6/uL (ref 3.77–5.28)
RDW: 11.5 % — ABNORMAL LOW (ref 11.7–15.4)
WBC: 11 10*3/uL — ABNORMAL HIGH (ref 3.4–10.8)

## 2020-12-18 LAB — COMPREHENSIVE METABOLIC PANEL
ALT: 11 IU/L (ref 0–32)
AST: 26 IU/L (ref 0–40)
Albumin/Globulin Ratio: 2 (ref 1.2–2.2)
Albumin: 4.7 g/dL (ref 3.7–4.7)
Alkaline Phosphatase: 64 IU/L (ref 44–121)
BUN/Creatinine Ratio: 17 (ref 12–28)
BUN: 12 mg/dL (ref 8–27)
Bilirubin Total: 0.5 mg/dL (ref 0.0–1.2)
CO2: 20 mmol/L (ref 20–29)
Calcium: 9.7 mg/dL (ref 8.7–10.3)
Chloride: 104 mmol/L (ref 96–106)
Creatinine, Ser: 0.7 mg/dL (ref 0.57–1.00)
Globulin, Total: 2.3 g/dL (ref 1.5–4.5)
Glucose: 108 mg/dL — ABNORMAL HIGH (ref 65–99)
Potassium: 4.4 mmol/L (ref 3.5–5.2)
Sodium: 140 mmol/L (ref 134–144)
Total Protein: 7 g/dL (ref 6.0–8.5)
eGFR: 91 mL/min/{1.73_m2} (ref >59)

## 2020-12-18 LAB — LIPID PANEL
Chol/HDL Ratio: 2.5 ratio (ref 0.0–4.4)
Cholesterol, Total: 221 mg/dL — ABNORMAL HIGH (ref 100–199)
HDL: 89 mg/dL (ref >39)
LDL Chol Calc (NIH): 106 mg/dL — ABNORMAL HIGH (ref 0–99)
Triglycerides: 151 mg/dL — ABNORMAL HIGH (ref 0–149)
VLDL Cholesterol Cal: 26 mg/dL (ref 5–40)

## 2020-12-18 LAB — TSH+FREE T4
Free T4: 1.55 ng/dL (ref 0.82–1.77)
TSH: 0.42 u[IU]/mL — ABNORMAL LOW (ref 0.450–4.500)

## 2021-02-13 ENCOUNTER — Other Ambulatory Visit: Payer: Self-pay | Admitting: Internal Medicine

## 2021-02-13 DIAGNOSIS — J4520 Mild intermittent asthma, uncomplicated: Secondary | ICD-10-CM

## 2021-02-22 ENCOUNTER — Ambulatory Visit
Admission: EM | Admit: 2021-02-22 | Discharge: 2021-02-22 | Disposition: A | Discharge status: Home / Self Care | Payer: Medicare Other

## 2021-02-22 ENCOUNTER — Other Ambulatory Visit: Payer: Self-pay

## 2021-02-22 DIAGNOSIS — H6122 Impacted cerumen, left ear: Secondary | ICD-10-CM | POA: Diagnosis not present

## 2021-02-22 NOTE — ED Provider Notes (Signed)
[facility]    CSN: [id number] Arrival date & time: 02/22/21  1452      History   Chief Complaint Ear Clogged Sensation  HPI Lindsey Golden is a 75 y.o. female.   HPI  Ear Clogged Sensation: Pt reports that for the past day she has had a clogging sensation of the left ear. Symptoms stable. She denies pain, discharge, bleeding or injury. She has tried flushing it at home without improvement.   Past Medical History:  Diagnosis Date   Allergy    Asthma    mild intermittent due to allergies.   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 Vira Agar   KNEE SURGERY     five different surgery    OB History   No obstetric history on file.      Home Medications    Prior to Admission medications   Medication Sig Start Date End Date Taking? Authorizing Provider  cetirizine (ZYRTEC) 10 MG tablet Take 1 tablet (10 mg total) by mouth daily. 07/06/17  Yes Glean Hess, MD  famotidine (PEPCID) 10 MG tablet Take 10 mg by mouth daily.   Yes [provider]   ferrous sulfate 325 (65 FE) MG tablet Take 325 mg by mouth daily with breakfast.   Yes [provider]  furosemide (LASIX) 20 MG tablet Take 1 tablet (20 mg total) by mouth daily as needed. 12/17/20  Yes Glean Hess, MD  gabapentin (NEURONTIN) 100 MG capsule TAKE 2 CAPSULES BY MOUTH TWICE DAILY 12/12/20  Yes Glean Hess, MD  levothyroxine (SYNTHROID) 75 MCG tablet Take 1 tablet (75 mcg total) by mouth daily before breakfast. 08/09/20  Yes Glean Hess, MD  Multiple Vitamins-Minerals (MULTIPLE VITAMINS/WOMENS PO) Take by mouth.   Yes [provider]  SYMBICORT 160-4.5 MCG/ACT inhaler INHALE 2 PUFFS TWICE A DAY RINSE MOUTH WITH WATER AFTER EACH USE 12/12/20  Yes Glean Hess, MD  VENTOLIN HFA 108 (90 Base) MCG/ACT inhaler INHALE 2 PUFFS BY MOUTH EVERY 6 HOURS ASNEEDED WHEEZING/ SHORTNESS OF BREATH 02/13/21  Yes Glean Hess, MD  Zoledronic Acid (RECLAST IV) Inject into the vein.   Yes [provider]    Family History Family History  Problem Relation Age of Onset   Depression Mother    Hearing loss Mother    Mental illness Mother    Miscarriages / [place] Mother    Asthma Father    Kidney disease Father    Stroke Father    Varicose Veins Father    Breast cancer Neg  Hx     Social History Social History   Tobacco Use   Smoking status: Some Days    Packs/day: 0.15    Years: 40.00    Pack years: 6.00    Types: Cigarettes   Smokeless tobacco: Never   Tobacco comments:    2-3 cigarettes daily  Vaping Use   Vaping Use: Never used  Substance Use Topics   Alcohol use: No   Drug use: No     Allergies   Aspirin, Penicillins, and Dairy aid [lactase]   Review of Systems Review of Systems  As stated above in HPI Physical Exam Triage Vital Signs ED Triage Vitals  Enc Vitals Group     BP 02/22/21 1509 128/82     Pulse Rate 02/22/21 1509 87     Resp 02/22/21 1509 18     Temp 02/22/21 1509 98.4 F (36.9 C)     Temp Source  02/22/21 1509 Oral     SpO2 02/22/21 1509 94 %     Weight 02/22/21 1508 115 lb (52.2 kg)     Height 02/22/21 1508 '5\' 4"'$  (1.626 m)     Head Circumference --      Peak Flow --      Pain Score 02/22/21 1508 0     Pain Loc --      Pain Edu? --      Excl. in [place]? --    No data found.  Updated Vital Signs BP 128/82 (BP Location: Left Arm)   Pulse 87   Temp 98.4 F (36.9 C) (Oral)   Resp 18   Ht '5\' 4"'$  (1.626 m)   Wt 115 lb (52.2 kg)   SpO2 94% Comment: Pt states she checks her O2 everymorning, was 97 this morning, hands are really cold.  BMI 19.74 kg/m   Physical Exam Vitals and nursing note reviewed.  Constitutional:      Appearance: Normal appearance.  HENT:     Head: Normocephalic and atraumatic.     Right Ear: Tympanic membrane, ear canal and external ear normal.     Left Ear: There is impacted cerumen.     Nose: Nose normal.     Mouth/Throat:     Mouth: Mucous membranes are moist.  Eyes:     Extraocular Movements: Extraocular movements intact.     Pupils: Pupils are equal, round, and reactive to light.  Musculoskeletal:     Cervical back: Neck supple.  Skin:    General: Skin is warm.  Neurological:     Mental Status: She is alert.     UC Treatments / Results  Labs (all labs ordered are listed, but only abnormal results are displayed) Labs Reviewed - No data to display  EKG   Radiology No results found.  Procedures Procedures (including critical care time)  Medications Ordered in UC Medications - No data to display  Initial Impression / Assessment and Plan / UC Course  I have reviewed the triage vital signs and the nursing notes.  Pertinent labs & imaging results that were available during my care of the patient were reviewed by me and considered in my medical decision making (see chart for details).     New. Ear lavage of the left ear successfully removed the impacted cerumen and resolved her loss of hearing. Discussed this patient and information  given to help prevent build up. Follow up PRN.  Final Clinical Impressions(s) / UC Diagnoses   Final diagnoses:  None   Discharge  Instructions   None    ED Prescriptions   None    PDMP not reviewed this encounter.   Hughie Closs, [place] 02/22/21 1553

## 2021-02-22 NOTE — ED Triage Notes (Signed)
Pt here with C/O left ear clogged for 1 days, no pain. Usually wakes up with it clogged feeling but usually pops and is fine. Has tried flushing it but not helping.

## 2021-03-06 ENCOUNTER — Other Ambulatory Visit: Payer: Self-pay | Admitting: Internal Medicine

## 2021-03-06 DIAGNOSIS — J4520 Mild intermittent asthma, uncomplicated: Secondary | ICD-10-CM

## 2021-03-19 ENCOUNTER — Other Ambulatory Visit: Payer: Self-pay | Admitting: Internal Medicine

## 2021-03-19 DIAGNOSIS — J4520 Mild intermittent asthma, uncomplicated: Secondary | ICD-10-CM

## 2021-03-19 DIAGNOSIS — R202 Paresthesia of skin: Secondary | ICD-10-CM

## 2021-03-19 NOTE — Telephone Encounter (Signed)
Requested Prescriptions  Pending Prescriptions Disp Refills  . SYMBICORT 160-4.5 MCG/ACT inhaler [Pharmacy Med Name: SYMBICORT 160-4.5 MCG/ACT INH AERO] 10.2 g 2    Sig: INHALE 2 PUFFS TWICE A DAY RINSE MOUTH WITH WATER AFTER EACH USE     Pulmonology:  Combination Products Passed - 03/19/2021  9:30 AM      Passed - Valid encounter within last 12 months    Recent Outpatient Visits          3 months ago Hypothyroidism due to acquired atrophy of thyroid   [facility] Glean Hess, MD   7 months ago Mild intermittent asthma, unspecified whether complicated   [facility] Glean Hess, MD   1 year ago Mild ankle edema   [facility] Glean Hess, MD   1 year ago Age-related osteoporosis without current pathological fracture   [facility] Glean Hess, MD   1 year ago Gastroesophageal reflux disease, unspecified whether esophagitis present   [facility] Glean Hess, MD             . gabapentin (NEURONTIN) 100 MG capsule [Pharmacy Med Name: GABAPENTIN 100 MG CAP] 360 capsule 0    Sig: TAKE 2 CAPSULES BY MOUTH TWICE DAILY     Neurology: Anticonvulsants - gabapentin Passed - 03/19/2021  9:30 AM      Passed - Valid encounter within last 12 months    Recent Outpatient Visits          3 months ago Hypothyroidism due to acquired atrophy of thyroid   [facility] Glean Hess, MD   7 months ago Mild intermittent asthma, unspecified whether complicated   [facility] Glean Hess, MD   1 year ago Mild ankle edema   [facility] Glean Hess, MD   1 year ago Age-related osteoporosis without current pathological fracture   [facility] Glean Hess, MD   1 year ago Gastroesophageal reflux disease, unspecified whether esophagitis present   [facility] Glean Hess, MD

## 2021-03-20 ENCOUNTER — Other Ambulatory Visit: Payer: Self-pay | Admitting: Internal Medicine

## 2021-03-20 DIAGNOSIS — R202 Paresthesia of skin: Secondary | ICD-10-CM

## 2021-03-21 NOTE — Telephone Encounter (Signed)
Requested Prescriptions  Refused Prescriptions Disp Refills  . gabapentin (NEURONTIN) 100 MG capsule [Pharmacy Med Name: GABAPENTIN 100 MG CAP] 360 capsule 0    Sig: TAKE 2 CAPSULES BY MOUTH TWICE DAILY     Neurology: Anticonvulsants - gabapentin Passed - 03/20/2021 10:49 AM      Passed - Valid encounter within last 12 months    Recent Outpatient Visits          3 months ago Hypothyroidism due to acquired atrophy of thyroid   [facility] Glean Hess, MD   7 months ago Mild intermittent asthma, unspecified whether complicated   [facility] Glean Hess, MD   1 year ago Mild ankle edema   [facility] Glean Hess, MD   1 year ago Age-related osteoporosis without current pathological fracture   [facility] Glean Hess, MD   1 year ago Gastroesophageal reflux disease, unspecified whether esophagitis present   [facility] Glean Hess, MD

## 2021-03-28 ENCOUNTER — Other Ambulatory Visit: Payer: Self-pay | Admitting: Internal Medicine

## 2021-03-28 DIAGNOSIS — J4520 Mild intermittent asthma, uncomplicated: Secondary | ICD-10-CM

## 2021-04-04 DIAGNOSIS — Z23 Encounter for immunization: Secondary | ICD-10-CM | POA: Diagnosis not present

## 2021-04-17 DIAGNOSIS — Z23 Encounter for immunization: Secondary | ICD-10-CM | POA: Diagnosis not present

## 2021-04-18 ENCOUNTER — Other Ambulatory Visit: Payer: Self-pay | Admitting: Internal Medicine

## 2021-04-18 DIAGNOSIS — J4520 Mild intermittent asthma, uncomplicated: Secondary | ICD-10-CM

## 2021-04-18 NOTE — Telephone Encounter (Signed)
Patient returned call- she is doing fine with her asthma- she is requesting a little early- she states sometimes she loses doses with clogged/cleaned inhaler and she is not completely out- she just doesn't want to run out. Patient states she has company coming for the holiday and does not want to have to worry about RF then- advised I would let provider know. Patient states she knows to reach out if needed with her asthma.

## 2021-04-18 NOTE — Telephone Encounter (Signed)
Requested medication (s) are due for refill today - too soon  Requested medication (s) are on the active medication list -yes  Future visit scheduled -no  Last refill: 03/28/21  Notes to clinic: Request RF- less than 30 days since last fill- attempted to call patient- left message to contact office- RF request sent for review   Requested Prescriptions  Pending Prescriptions Disp Refills   VENTOLIN HFA 108 (90 Base) MCG/ACT inhaler [Pharmacy Med Name: VENTOLIN HFA 108 (90 BASE) MCG/ACT] 18 g 0    Sig: INHALE 2 PUFFS BY MOUTH EVERY 6 HOURS ASNEEDED WHEEZING/ SHORTNESS OF BREATH     Pulmonology:  Beta Agonists Failed - 04/18/2021  8:34 AM      Failed - One inhaler should last at least one month. If the patient is requesting refills earlier, contact the patient to check for uncontrolled symptoms.      Passed - Valid encounter within last 12 months    Recent Outpatient Visits           4 months ago Hypothyroidism due to acquired atrophy of thyroid   [facility] Glean Hess, MD   8 months ago Mild intermittent asthma, unspecified whether complicated   [facility] Glean Hess, MD   1 year ago Mild ankle edema   [facility] Glean Hess, MD   1 year ago Age-related osteoporosis without current pathological fracture   [facility] Glean Hess, MD   1 year ago Gastroesophageal reflux disease, unspecified whether esophagitis present   [facility] Glean Hess, MD                 Requested Prescriptions  Pending Prescriptions Disp Refills   VENTOLIN HFA 108 (90 Base) MCG/ACT inhaler [Pharmacy Med Name: VENTOLIN HFA 108 (90 BASE) MCG/ACT] 18 g 0    Sig: INHALE 2 PUFFS BY MOUTH EVERY 6 HOURS ASNEEDED WHEEZING/ SHORTNESS OF BREATH     Pulmonology:  Beta Agonists Failed - 04/18/2021  8:34 AM      Failed - One inhaler should last at least one month. If the patient is requesting refills earlier, contact the  patient to check for uncontrolled symptoms.      Passed - Valid encounter within last 12 months    Recent Outpatient Visits           4 months ago Hypothyroidism due to acquired atrophy of thyroid   [facility] Glean Hess, MD   8 months ago Mild intermittent asthma, unspecified whether complicated   [facility] Glean Hess, MD   1 year ago Mild ankle edema   [facility] Glean Hess, MD   1 year ago Age-related osteoporosis without current pathological fracture   [facility] Glean Hess, MD   1 year ago Gastroesophageal reflux disease, unspecified whether esophagitis present   [facility] Glean Hess, MD

## 2021-04-29 ENCOUNTER — Ambulatory Visit: Payer: Medicare Other

## 2021-05-06 ENCOUNTER — Ambulatory Visit (INDEPENDENT_AMBULATORY_CARE_PROVIDER_SITE_OTHER): Payer: Medicare Other

## 2021-05-06 DIAGNOSIS — Z1231 Encounter for screening mammogram for malignant neoplasm of breast: Secondary | ICD-10-CM | POA: Diagnosis not present

## 2021-05-06 DIAGNOSIS — Z0000 Encounter for general adult medical examination without abnormal findings: Secondary | ICD-10-CM | POA: Diagnosis not present

## 2021-05-06 NOTE — Progress Notes (Signed)
Subjective:   Lindsey Golden is a 75 y.o. female who presents for Medicare Annual (Subsequent) preventive examination.  Virtual Visit via Telephone Note  I connected with  Lindsey Golden on 05/06/21 at 10:40 AM EST by telephone and verified that I am speaking with the correct person using two identifiers.  Location: Patient: home Provider: [facility] Persons participating in the virtual visit: [place]   I discussed the limitations, risks, security and privacy concerns of performing an evaluation and management service by telephone and the availability of in person appointments. The patient expressed understanding and agreed to proceed.  Interactive audio and video telecommunications were attempted between this nurse and patient, however failed, due to patient having technical difficulties OR patient did not have access to video capability.  We continued and completed visit with audio only.  Some vital signs may be absent or patient reported.   Clemetine Marker, LPN   Review of Systems     Cardiac Risk Factors include: advanced age (>23men, >39 women)     Objective:    There were no vitals filed for this visit. There is no height or weight on file to calculate BMI.  Advanced Directives 05/06/2021 02/22/2021 10/12/2020 04/25/2020 04/25/2019 04/21/2018 04/02/2017  Does Patient Have a Medical Advance Directive? Yes No No Yes No Yes Yes  Type of Paramedic of [place];Living will - - [place];Living will - [place];Living will [place];Living will  Does patient want to make changes to medical advance directive? - - - - No - Patient declined - -  Copy of [place] in Chart? No - copy requested - - No - copy requested - No - copy requested No - copy requested    Current Medications (verified) Outpatient Encounter Medications as of 05/06/2021  Medication  Sig   cetirizine (ZYRTEC) 10 MG tablet Take 1 tablet (10 mg total) by mouth daily.   famotidine (PEPCID) 10 MG tablet Take 10 mg by mouth daily.   ferrous sulfate 325 (65 FE) MG tablet Take 325 mg by mouth daily with breakfast.   furosemide (LASIX) 20 MG tablet Take 1 tablet (20 mg total) by mouth daily as needed.   gabapentin (NEURONTIN) 100 MG capsule TAKE 2 CAPSULES BY MOUTH TWICE DAILY   levothyroxine (SYNTHROID) 75 MCG tablet Take 1 tablet (75 mcg total) by mouth daily before breakfast.   Multiple Vitamins-Minerals (MULTIPLE VITAMINS/WOMENS PO) Take by mouth.   SYMBICORT 160-4.5 MCG/ACT inhaler INHALE 2 PUFFS TWICE A DAY RINSE MOUTH WITH WATER AFTER EACH USE   VENTOLIN HFA 108 (90 Base) MCG/ACT inhaler INHALE 2 PUFFS BY MOUTH EVERY 6 HOURS ASNEEDED WHEEZING/ SHORTNESS OF BREATH   Zoledronic Acid (RECLAST IV) Inject into the vein.   No facility-administered encounter medications on file as of 05/06/2021.    Allergies (verified) Aspirin, Penicillins, and Dairy aid [tilactase]   History: Past Medical History:  Diagnosis Date   Allergy    Asthma    mild intermittent due to allergies.   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 Vira Agar   KNEE SURGERY     five different surgery   Family  History  Problem Relation Age of Onset   Depression Mother    Hearing loss Mother    Mental illness Mother    Miscarriages / [place] Mother    Asthma Father    Kidney disease Father    Stroke Father    Varicose Veins Father    Breast cancer Neg Hx    Social History   Socioeconomic History   Marital status: Married    Spouse name: Not on file   Number of children: 0   Years of education: Not on file   Highest education level: Some college, no degree  Occupational  History   Occupation: retired  Tobacco Use   Smoking status: Every Day    Packs/day: 0.15    Years: 40.00    Pack years: 6.00    Types: Cigarettes   Smokeless tobacco: Never   Tobacco comments:    2-3 cigarettes daily  Vaping Use   Vaping Use: Never used  Substance and Sexual Activity   Alcohol use: No   Drug use: No   Sexual activity: Never  Other Topics Concern   Not on file  Social History Narrative   Not on file   Social Determinants of Health   Financial Resource Strain: Low Risk    Difficulty of Paying Living Expenses: Not hard at all  Food Insecurity: No Food Insecurity   Worried About Charity fundraiser in the Last Year: Never true   [place] in the Last Year: Never true  Transportation Needs: No Transportation Needs   Lack of Transportation (Medical): No   Lack of Transportation (Non-Medical): No  Physical Activity: Insufficiently Active   Days of Exercise per Week: 7 days   Minutes of Exercise per Session: 10 min  Stress: No Stress Concern Present   Feeling of Stress : Not at all  Social Connections: Moderately Isolated   Frequency of Communication with Friends and Family: More than three times a week   Frequency of Social Gatherings with Friends and Family: More than three times a week   Attends Religious Services: Never   Marine scientist or Organizations: No   Attends Music therapist: Never   Marital Status: Married    Tobacco Counseling Ready to quit: No Counseling given: Not Answered Tobacco comments: 2-3 cigarettes daily   Clinical Intake:  Pre-visit preparation completed: Yes  Pain : No/denies pain     Nutritional Risks: None Diabetes: No  How often do you need to have someone help you when you read instructions, pamphlets, or other written materials from your doctor or pharmacy?: 1 - Never   Interpreter Needed?: No  Information entered by :: Clemetine Marker LPN   Activities of Daily Living In your  present state of health, do you have any difficulty performing the following activities: 05/06/2021 12/17/2020  Hearing? N N  Vision? N N  Difficulty concentrating or making decisions? N N  Walking or climbing stairs? N N  Dressing or bathing? N N  Doing errands, shopping? N N  Preparing Food and eating ? N -  Using the Toilet? N -  In the past six months, have you accidently leaked urine? N -  Do you have problems with loss of bowel control? N -  Managing your Medications? N -  Managing your Finances? N -  Housekeeping or managing your Housekeeping? N -  Some recent data might be hidden    Patient Care Team: Glean Hess, MD  as PCP - General (Internal Medicine) Solum, Betsey Holiday, MD as Physician Assistant (Endocrinology)  Indicate any recent Medical Services you may have received from other than [facility] providers in the past year (date may be approximate).     Assessment:   This is a routine wellness examination for [place].  Hearing/Vision screen Hearing Screening - Comments:: Pt denies hearing difficulty Vision Screening - Comments:: Annual vision screenings with Dr. Wyatt [facility]ela at [facility] (every 2 years)  Dietary issues and exercise activities discussed: Current Exercise Habits: Home exercise routine, Type of exercise: stretching, Time (Minutes): 15, Frequency (Times/Week): 7, Weekly Exercise (Minutes/Week): 105, Intensity: Mild, Exercise limited by: None identified   Goals Addressed             This Visit's Progress    Quit smoking / using tobacco   Not on track    Recommend to attend smoking cessation classes or contact 1-800-QUIT-NOW       Depression Screen PHQ 2/9 Scores 05/06/2021 12/17/2020 08/09/2020 04/25/2020 06/08/2019 05/23/2019 04/25/2019  PHQ - 2 Score 0 0 0 0 0 2 0  PHQ- 9 Score - 0 0 - 0 3 -    Fall Risk Fall Risk  05/06/2021 12/17/2020 08/09/2020 04/25/2020 06/08/2019  Falls in the past year? 0 0 0 0 0  Number falls in past yr: 0 0 0 0 0  Injury with Fall?  0 0 0 0 0  Risk for fall due to : No Fall Risks - - No Fall Risks -  Follow up Falls prevention discussed Falls evaluation completed Falls evaluation completed Falls prevention discussed -    FALL RISK PREVENTION PERTAINING TO THE HOME:  Any stairs in or around the home? Yes  If so, are there any without handrails? No  Home free of loose throw rugs in walkways, pet beds, electrical cords, etc? Yes  Adequate lighting in your home to reduce risk of falls? Yes   ASSISTIVE DEVICES UTILIZED TO PREVENT FALLS:  Life alert? No  Use of a cane, walker or w/c? No  Grab bars in the bathroom? Yes  Shower chair or bench in shower? Yes  Elevated toilet seat or a handicapped toilet? No   TIMED UP AND GO:  Was the test performed? No . Telephonic visit.   Cognitive Function: Normal cognitive status assessed by direct observation by this Nurse Health Advisor. No abnormalities found.       6CIT Screen 04/25/2019 04/21/2018 04/02/2017  What Year? 0 points 0 points 0 points  What month? 0 points 0 points 0 points  What time? 0 points 0 points 0 points  Count back from 20 0 points 0 points 0 points  Months in reverse 0 points 0 points 2 points  Repeat phrase 0 points 0 points 0 points  Total Score 0 0 2    Immunizations Immunization History  Administered Date(s) Administered   Influenza, High Dose Seasonal PF 04/05/2015, 03/18/2016, 03/06/2017, 03/18/2019, 04/16/2020, 04/04/2021   Influenza-Unspecified 03/24/2019   Moderna Covid-19 Vaccine Bivalent Booster 31yrs & up 04/17/2021   PFIZER Comirnaty(Gray Top)Covid-19 Tri-Sucrose Vaccine 07/30/2019, 08/21/2019   PFIZER(Purple Top)SARS-COV-2 Vaccination 03/27/2020, 09/11/2020   Pneumococcal Conjugate-13 03/08/2014   Pneumococcal Polysaccharide-23 09/17/2012   Tdap 09/29/2014   Zoster Recombinat (Shingrix) 07/08/2018, 12/14/2018    TDAP status: Up to date  Flu Vaccine status: Up to date  Pneumococcal vaccine status: Up to date  Covid-19  vaccine status: Completed vaccines  Qualifies for Shingles Vaccine? Yes   Zostavax completed Yes   Shingrix  Completed?: Yes  Screening Tests Health Maintenance  Topic Date Due   MAMMOGRAM  07/16/2021   COLONOSCOPY (Pts 45-2yrs Insurance coverage will need to be confirmed)  01/20/2023   TETANUS/TDAP  09/28/2024   Pneumonia Vaccine 81+ Years old  Completed   INFLUENZA VACCINE  Completed   DEXA SCAN  Completed   COVID-19 Vaccine  Completed   Hepatitis C Screening  Completed   Zoster Vaccines- Shingrix  Completed   HPV VACCINES  Aged Out    Health Maintenance  There are no preventive care reminders to display for this patient.   Colorectal cancer screening: Type of screening: Colonoscopy. Completed 01/19/18. Repeat every 5 years  Mammogram status: Completed 07/16/20. Repeat every year  Bone Density status: Completed 05/30/19. Results reflect: Bone density results: OSTEOPOROSIS. Repeat every 2 years. Scheduled for 08/27/21. Reclast infusion scheduled 09/03/21.  Lung Cancer Screening: (Low Dose CT Chest recommended if Age 17-80 years, 30 pack-year currently smoking OR have quit w/in 15years.) does not qualify.   Additional Screening:  Hepatitis C Screening: does qualify; Completed 05/08/17  Vision Screening: Recommended annual ophthalmology exams for early detection of glaucoma and other disorders of the eye. Is the patient up to date with their annual eye exam?  Yes  Who is the provider or what is the name of the office in which the patient attends annual eye exams? Dr. Wyatt Portela   Dental Screening: Recommended annual dental exams for proper oral hygiene  Community Resource Referral / Chronic Care Management: CRR required this visit?  No  CCM required this visit?  No      Plan:     I have personally reviewed and noted the following in the patient's chart:   Medical and social history Use of alcohol, tobacco or illicit drugs  Current medications and supplements including  opioid prescriptions.  Functional ability and status Nutritional status Physical activity Advanced directives List of other physicians Hospitalizations, surgeries, and ER visits in previous 12 months Vitals Screenings to include cognitive, depression, and falls Referrals and appointments  In addition, I have reviewed and discussed with patient certain preventive protocols, quality metrics, and best practice recommendations. A written personalized care plan for preventive services as well as general preventive health recommendations were provided to patient.     Clemetine Marker, LPN   21/30/[id number]   Nurse Notes: pt states she received a letter from insurance stating that as of 06/09/21 they would no longer cover symbicort and prefer the breo inhaler. Pt requested an appt in Jan to discuss; scheduled for 06/11/21

## 2021-05-06 NOTE — Patient Instructions (Signed)
Lindsey Golden , Thank you for taking time to come for your Medicare Wellness Visit. I appreciate your ongoing commitment to your health goals. Please review the following plan we discussed and let me know if I can assist you in the future.   Screening recommendations/referrals: Colonoscopy: done 01/19/18. Repeat 01/2023 Mammogram: done 07/16/20. Please call [phone] to schedule your mammogram.  Bone Density: done 05/30/19. Scheduled for 08/27/21 Recommended yearly ophthalmology/optometry visit for glaucoma screening and checkup Recommended yearly dental visit for hygiene and checkup  Vaccinations: Influenza vaccine: done 04/04/21 Pneumococcal vaccine: done 03/08/14 Tdap vaccine: done 09/29/14 Shingles vaccine: done 07/08/18 & 12/14/18   Covid-19:done 07/30/19, 08/21/19, 03/27/20, 09/11/20 & 04/17/21  Advanced directives: Please bring a copy of your health care power of attorney and living will to the office at your convenience.   Conditions/risks identified: If you wish to quit smoking, help is available. For free tobacco cessation program offerings call the [facility] at [phone] or Live Well Line at (618)341-4752. You may also visit [email or web address] or email [email or web address] for more information on other programs.   Next appointment: Follow up in one year for your annual wellness visit    Preventive Care 65 Years and Older, Female Preventive care refers to lifestyle choices and visits with your health care provider that can promote health and wellness. What does preventive care include? A yearly physical exam. This is also called an annual well check. Dental exams once or twice a year. Routine eye exams. Ask your health care provider how often you should have your eyes checked. Personal lifestyle choices, including: Daily care of your teeth and gums. Regular physical activity. Eating a healthy diet. Avoiding tobacco and drug use. Limiting alcohol  use. Practicing safe sex. Taking low-dose aspirin every day. Taking vitamin and mineral supplements as recommended by your health care provider. What happens during an annual well check? The services and screenings done by your health care provider during your annual well check will depend on your age, overall health, lifestyle risk factors, and family history of disease. Counseling  Your health care provider may ask you questions about your: Alcohol use. Tobacco use. Drug use. Emotional well-being. Home and relationship well-being. Sexual activity. Eating habits. History of falls. Memory and ability to understand (cognition). Work and work Statistician. Reproductive health. Screening  You may have the following tests or measurements: Height, weight, and BMI. Blood pressure. Lipid and cholesterol levels. These may be checked every 5 years, or more frequently if you are over 42 years old. Skin check. Lung cancer screening. You may have this screening every year starting at age 72 if you have a 30-pack-year history of smoking and currently smoke or have quit within the past 15 years. Fecal occult blood test (FOBT) of the stool. You may have this test every year starting at age 63. Flexible sigmoidoscopy or colonoscopy. You may have a sigmoidoscopy every 5 years or a colonoscopy every 10 years starting at age 22. Hepatitis C blood test. Hepatitis B blood test. Sexually transmitted disease (STD) testing. Diabetes screening. This is done by checking your blood sugar (glucose) after you have not eaten for a while (fasting). You may have this done every 1-3 years. Bone density scan. This is done to screen for osteoporosis. You may have this done starting at age 67. Mammogram. This may be done every 1-2 years. Talk to your health care provider about how often you should have regular mammograms. Talk with your health care provider about  your test results, treatment options, and if necessary,  the need for more tests. Vaccines  Your health care provider may recommend certain vaccines, such as: Influenza vaccine. This is recommended every year. Tetanus, diphtheria, and acellular pertussis (Tdap, Td) vaccine. You may need a Td booster every 10 years. Zoster vaccine. You may need this after age 70. Pneumococcal 13-valent conjugate (PCV13) vaccine. One dose is recommended after age 5. Pneumococcal polysaccharide (PPSV23) vaccine. One dose is recommended after age 54. Talk to your health care provider about which screenings and vaccines you need and how often you need them. This information is not intended to replace advice given to you by your health care provider. Make sure you discuss any questions you have with your health care provider. Document Released: 06/22/2015 Document Revised: 02/13/2016 Document Reviewed: 03/27/2015 Elsevier Interactive Patient Education  2017 [place] Prevention in the Home Falls can cause injuries. They can happen to people of all ages. There are many things you can do to make your home safe and to help prevent falls. What can I do on the outside of my home? Regularly fix the edges of walkways and driveways and fix any cracks. Remove anything that might make you trip as you walk through a door, such as a raised step or threshold. Trim any bushes or trees on the path to your home. Use bright outdoor lighting. Clear any walking paths of anything that might make someone trip, such as rocks or tools. Regularly check to see if handrails are loose or broken. Make sure that both sides of any steps have handrails. Any raised decks and porches should have guardrails on the edges. Have any leaves, snow, or ice cleared regularly. Use sand or salt on walking paths during winter. Clean up any spills in your garage right away. This includes oil or grease spills. What can I do in the bathroom? Use night lights. Install grab bars by the toilet and in the  tub and shower. Do not use towel bars as grab bars. Use non-skid mats or decals in the tub or shower. If you need to sit down in the shower, use a plastic, non-slip stool. Keep the floor dry. Clean up any water that spills on the floor as soon as it happens. Remove soap buildup in the tub or shower regularly. Attach bath mats securely with double-sided non-slip rug tape. Do not have throw rugs and other things on the floor that can make you trip. What can I do in the bedroom? Use night lights. Make sure that you have a light by your bed that is easy to reach. Do not use any sheets or blankets that are too big for your bed. They should not hang down onto the floor. Have a firm chair that has side arms. You can use this for support while you get dressed. Do not have throw rugs and other things on the floor that can make you trip. What can I do in the kitchen? Clean up any spills right away. Avoid walking on wet floors. Keep items that you use a lot in easy-to-reach places. If you need to reach something above you, use a strong step stool that has a grab bar. Keep electrical cords out of the way. Do not use floor polish or wax that makes floors slippery. If you must use wax, use non-skid floor wax. Do not have throw rugs and other things on the floor that can make you trip. What can I do with  my stairs? Do not leave any items on the stairs. Make sure that there are handrails on both sides of the stairs and use them. Fix handrails that are broken or loose. Make sure that handrails are as long as the stairways. Check any carpeting to make sure that it is firmly attached to the stairs. Fix any carpet that is loose or worn. Avoid having throw rugs at the top or bottom of the stairs. If you do have throw rugs, attach them to the floor with carpet tape. Make sure that you have a light switch at the top of the stairs and the bottom of the stairs. If you do not have them, ask someone to add them for  you. What else can I do to help prevent falls? Wear shoes that: Do not have high heels. Have rubber bottoms. Are comfortable and fit you well. Are closed at the toe. Do not wear sandals. If you use a stepladder: Make sure that it is fully opened. Do not climb a closed stepladder. Make sure that both sides of the stepladder are locked into place. Ask someone to hold it for you, if possible. Clearly mark and make sure that you can see: Any grab bars or handrails. First and last steps. Where the edge of each step is. Use tools that help you move around (mobility aids) if they are needed. These include: Canes. Walkers. Scooters. Crutches. Turn on the lights when you go into a dark area. Replace any light bulbs as soon as they burn out. Set up your furniture so you have a clear path. Avoid moving your furniture around. If any of your floors are uneven, fix them. If there are any pets around you, be aware of where they are. Review your medicines with your doctor. Some medicines can make you feel dizzy. This can increase your chance of falling. Ask your doctor what other things that you can do to help prevent falls. This information is not intended to replace advice given to you by your health care provider. Make sure you discuss any questions you have with your health care provider. Document Released: 03/22/2009 Document Revised: 11/01/2015 Document Reviewed: 06/30/2014 Elsevier Interactive Patient Education  2017 [employer].

## 2021-05-22 ENCOUNTER — Other Ambulatory Visit: Payer: Self-pay | Admitting: Internal Medicine

## 2021-05-22 ENCOUNTER — Telehealth: Payer: Self-pay

## 2021-05-22 DIAGNOSIS — J4520 Mild intermittent asthma, uncomplicated: Secondary | ICD-10-CM

## 2021-05-22 NOTE — Telephone Encounter (Signed)
Requested Prescriptions  Pending Prescriptions Disp Refills   VENTOLIN HFA 108 (90 Base) MCG/ACT inhaler [Pharmacy Med Name: VENTOLIN HFA 108 (90 BASE) MCG/ACT] 18 g 0    Sig: INHALE 2 PUFFS BY MOUTH EVERY 6 HOURS ASNEEDED WHEEZING/ SHORTNESS OF BREATH     Pulmonology:  Beta Agonists Failed - 05/22/2021  9:40 AM      Failed - One inhaler should last at least one month. If the patient is requesting refills earlier, contact the patient to check for uncontrolled symptoms.      Passed - Valid encounter within last 12 months    Recent Outpatient Visits          5 months ago Hypothyroidism due to acquired atrophy of thyroid   [facility] Glean Hess, MD   9 months ago Mild intermittent asthma, unspecified whether complicated   [facility] Glean Hess, MD   1 year ago Mild ankle edema   [facility] Glean Hess, MD   1 year ago Age-related osteoporosis without current pathological fracture   [facility] Glean Hess, MD   2 years ago Gastroesophageal reflux disease, unspecified whether esophagitis present   [facility] Glean Hess, MD      Future Appointments            In 2 weeks Army Melia Jesse Sans, MD [facility], [facility]

## 2021-05-22 NOTE — Telephone Encounter (Signed)
Called pt left VM as a reminder to call and schedule mammogram. ° °PEC nurse may give results to patient if they return call to clinic, a CRM has been created. ° °KP °

## 2021-06-11 ENCOUNTER — Ambulatory Visit (INDEPENDENT_AMBULATORY_CARE_PROVIDER_SITE_OTHER): Payer: Medicare Other | Admitting: Internal Medicine

## 2021-06-11 ENCOUNTER — Other Ambulatory Visit: Payer: Self-pay

## 2021-06-11 ENCOUNTER — Encounter: Payer: Self-pay | Admitting: Internal Medicine

## 2021-06-11 VITALS — BP 124/76 | HR 96 | Ht 64.0 in | Wt 118.0 lb

## 2021-06-11 DIAGNOSIS — J3089 Other allergic rhinitis: Secondary | ICD-10-CM

## 2021-06-11 DIAGNOSIS — E034 Atrophy of thyroid (acquired): Secondary | ICD-10-CM

## 2021-06-11 DIAGNOSIS — R202 Paresthesia of skin: Secondary | ICD-10-CM | POA: Diagnosis not present

## 2021-06-11 DIAGNOSIS — J4520 Mild intermittent asthma, uncomplicated: Secondary | ICD-10-CM | POA: Diagnosis not present

## 2021-06-11 MED ORDER — FLUTICASONE FUROATE-VILANTEROL 100-25 MCG/ACT IN AEPB: 1.0000 | INHALATION_SPRAY | Freq: Every day | RESPIRATORY_TRACT | 11 refills | Status: DC

## 2021-06-11 MED ORDER — GABAPENTIN 100 MG PO CAPS: 100.0000 mg | ORAL_CAPSULE | Freq: Four times a day (QID) | ORAL | 1 refills | Status: DC

## 2021-06-11 NOTE — Patient Instructions (Addendum)
Claritin Xyzal Allegra  Nasacort nasal spray as an alternative

## 2021-06-11 NOTE — Progress Notes (Signed)
Date:  06/11/2021   Name:  Lindsey Golden   DOB:  Dec 24, 1945   MRN:  [id number]   Chief Complaint: Asthma and Hypothyroidism  Asthma There is no cough, hoarse voice, shortness of breath or wheezing. Associated symptoms include postnasal drip. Pertinent negatives include no chest pain, fever, headaches or trouble swallowing. Her past medical history is significant for asthma.  Thyroid Problem Presents for follow-up visit. Symptoms include anxiety (over husbands health). Patient reports no depressed mood, diarrhea, fatigue, heat intolerance, hoarse voice, leg swelling, palpitations or visual change.   Lab Results  Component Value Date   NA 140 12/17/2020   K 4.4 12/17/2020   CO2 20 12/17/2020   GLUCOSE 108 (H) 12/17/2020   BUN 12 12/17/2020   CREATININE 0.70 12/17/2020   CALCIUM 9.7 12/17/2020   EGFR 91 12/17/2020   GFRNONAA 76 06/08/2019   Lab Results  Component Value Date   CHOL 221 (H) 12/17/2020   HDL 89 12/17/2020   LDLCALC 106 (H) 12/17/2020   TRIG 151 (H) 12/17/2020   CHOLHDL 2.5 12/17/2020   Lab Results  Component Value Date   TSH 0.420 (L) 12/17/2020   Lab Results  Component Value Date   HGBA1C 5.0 09/02/2016   Lab Results  Component Value Date   WBC 11.0 (H) 12/17/2020   HGB 14.3 12/17/2020   HCT 43.3 12/17/2020   MCV 97 12/17/2020   PLT 237 12/17/2020   Lab Results  Component Value Date   ALT 11 12/17/2020   AST 26 12/17/2020   ALKPHOS 64 12/17/2020   BILITOT 0.5 12/17/2020   Lab Results  Component Value Date   VD25OH 56 11/29/2019     Review of Systems  Constitutional:  Negative for chills, fatigue and fever.  HENT:  Positive for congestion and postnasal drip. Negative for hoarse voice, sinus pressure and trouble swallowing.   Respiratory:  Negative for cough, chest tightness, shortness of breath and wheezing.   Cardiovascular:  Negative for chest pain and palpitations.  Gastrointestinal:  Negative for diarrhea.  Endocrine:  Negative for heat intolerance.  Neurological:  Negative for dizziness, light-headedness and headaches.  Psychiatric/Behavioral:  Negative for dysphoric mood and sleep disturbance. The patient is nervous/anxious (over husbands health).    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 Vira Agar   KNEE SURGERY     five different surgery    Social History   Tobacco Use   Smoking status: Every Day    Packs/day: 0.15  Years: 40.00    Pack years: 6.00    Types: Cigarettes   Smokeless tobacco: Never   Tobacco comments:    2-3 cigarettes daily  Vaping Use   Vaping Use: Never used  Substance Use Topics   Alcohol use: No   Drug use: No     Medication list has been reviewed and updated.  Current Meds  Medication Sig   cetirizine (ZYRTEC) 10 MG tablet Take 1 tablet (10 mg total) by mouth daily.   famotidine (PEPCID) 10 MG tablet Take 10 mg by mouth daily.   ferrous sulfate 325 (65 FE) MG tablet Take 325 mg by mouth daily with breakfast.    furosemide (LASIX) 20 MG tablet Take 1 tablet (20 mg total) by mouth daily as needed.   gabapentin (NEURONTIN) 100 MG capsule TAKE 2 CAPSULES BY MOUTH TWICE DAILY   levothyroxine (SYNTHROID) 75 MCG tablet Take 1 tablet (75 mcg total) by mouth daily before breakfast.   Multiple Vitamins-Minerals (MULTIPLE VITAMINS/WOMENS PO) Take by mouth.   VENTOLIN HFA 108 (90 Base) MCG/ACT inhaler INHALE 2 PUFFS BY MOUTH EVERY 6 HOURS ASNEEDED WHEEZING/ SHORTNESS OF BREATH   Zoledronic Acid (RECLAST IV) Inject into the vein.   [DISCONTINUED] SYMBICORT 160-4.5 MCG/ACT inhaler INHALE 2 PUFFS TWICE A DAY RINSE MOUTH WITH WATER AFTER [facility] USE    PHQ 2/9 Scores 06/11/2021 05/06/2021 12/17/2020 08/09/2020  PHQ - 2 Score 0 0 0 0  PHQ- 9 Score 0 - 0 0    GAD 7 : Generalized Anxiety Score 06/11/2021 12/17/2020 08/09/2020  Nervous, Anxious, on Edge 0 0 0  Control/stop worrying 0 0 0  Worry too much - different things 0 0 0  Trouble relaxing 0 0 0  Restless 0 0 0  Easily annoyed or irritable 0 0 0  Afraid - awful might happen 0 0 0  Total GAD 7 Score 0 0 0  Anxiety Difficulty Not difficult at all Not difficult at all Not difficult at all    BP Readings from Last 3 Encounters:  06/11/21 124/76  02/22/21 128/82  12/17/20 116/78    Physical Exam Vitals and nursing note reviewed.  Constitutional:      General: She is not in acute distress.    Appearance: She is well-developed.  HENT:     Head: Normocephalic and atraumatic.  Neck:     Vascular: No carotid bruit.  Cardiovascular:     Rate and Rhythm: Normal rate and regular rhythm.  Pulmonary:     Effort: Pulmonary effort is normal. No respiratory distress.     Breath sounds: No wheezing or rhonchi.  Musculoskeletal:     Cervical back: Normal range of motion.     Right lower leg: No edema.     Left lower leg: No edema.  Lymphadenopathy:     Cervical: No cervical adenopathy.  Skin:    General: Skin is warm and dry.     Findings: No rash.   Neurological:     Mental Status: She is alert and oriented to person, place, and time.  Psychiatric:        Mood and Affect: Mood normal.        Behavior: Behavior normal.    Wt Readings from Last 3 Encounters:  06/11/21 118 lb (53.5 kg)  02/22/21 115 lb (52.2 kg)  12/17/20 117 lb (53.1 kg)    BP 124/76    Pulse 96    Ht 5' 4"  (1.626 m)    Wt 118 lb (  53.5 kg)    SpO2 95%    BMI 20.25 kg/m   Assessment and Plan: 1. Mild intermittent asthma, unspecified whether complicated Symbicort no longer covered. Will send in [place] demonstrated. Continue Ventolin PRN - fluticasone furoate-vilanterol (BREO ELLIPTA) 100-25 MCG/ACT AEPB; Inhale 1 puff into the lungs daily.  Dispense: 1 each; Refill: 11  2. Hypothyroidism due to acquired atrophy of thyroid Supplemented Recent TSH was normal. No sx to suggest poor control.  3. Tingling in extremities Doing well with gabapentin. - gabapentin (NEURONTIN) 100 MG capsule; Take 1 capsule (100 mg total) by mouth 4 (four) times daily.  Dispense: 360 capsule; Refill: 1  4. Environmental and seasonal allergies On Zyrtec with excessive dryness. Recommend trial of Nasacort Spray or alternatives such as Allegra, Xyzal or Claritin   Partially dictated using Editor, commissioning. Any errors are unintentional.  Halina Maidens, MD [facility]  06/11/2021

## 2021-06-21 ENCOUNTER — Other Ambulatory Visit: Payer: Self-pay | Admitting: Internal Medicine

## 2021-06-21 DIAGNOSIS — J4520 Mild intermittent asthma, uncomplicated: Secondary | ICD-10-CM

## 2021-06-21 NOTE — Telephone Encounter (Signed)
Requested Prescriptions  Pending Prescriptions Disp Refills   VENTOLIN HFA 108 (90 Base) MCG/ACT inhaler [Pharmacy Med Name: VENTOLIN HFA 108 (90 BASE) MCG/ACT] 18 g 0    Sig: INHALE 2 PUFFS BY MOUTH EVERY 6 HOURS ASNEEDED WHEEZING/ SHORTNESS OF BREATH     Pulmonology:  Beta Agonists Failed - 06/21/2021 10:10 AM      Failed - One inhaler should last at least one month. If the patient is requesting refills earlier, contact the patient to check for uncontrolled symptoms.      Passed - Valid encounter within last 12 months    Recent Outpatient Visits          1 week ago Mild intermittent asthma, unspecified whether complicated   [facility] Glean Hess, MD   6 months ago Hypothyroidism due to acquired atrophy of thyroid   [facility] Glean Hess, MD   10 months ago Mild intermittent asthma, unspecified whether complicated   [facility] Glean Hess, MD   1 year ago Mild ankle edema   [facility] Glean Hess, MD   2 years ago Age-related osteoporosis without current pathological fracture   [facility] Glean Hess, MD      Future Appointments            In 5 months Army Melia Jesse Sans, MD [facility], [facility]

## 2021-07-10 ENCOUNTER — Ambulatory Visit (INDEPENDENT_AMBULATORY_CARE_PROVIDER_SITE_OTHER): Payer: Medicare Other | Admitting: Internal Medicine

## 2021-07-10 ENCOUNTER — Encounter: Payer: Self-pay | Admitting: Internal Medicine

## 2021-07-10 ENCOUNTER — Other Ambulatory Visit: Payer: Self-pay

## 2021-07-10 VITALS — BP 124/80 | HR 97 | Ht 64.0 in | Wt 117.0 lb

## 2021-07-10 DIAGNOSIS — R109 Unspecified abdominal pain: Secondary | ICD-10-CM

## 2021-07-10 DIAGNOSIS — J4520 Mild intermittent asthma, uncomplicated: Secondary | ICD-10-CM | POA: Insufficient documentation

## 2021-07-10 NOTE — Progress Notes (Signed)
Date:  07/10/2021   Name:  Lindsey Golden   DOB:  [birth year]   MRN:  [id number]   Chief Complaint: Chest Pain  Chest Pain  This is a new problem. Episode onset: past three days. The onset quality is sudden. The problem occurs daily. The problem has been gradually improving. The pain is present in the lateral region. The pain is at a severity of 4/10. The pain is mild. The quality of the pain is described as dull. The pain does not radiate. Associated symptoms include abdominal pain and shortness of breath. Pertinent negatives include no cough, fever, nausea, palpitations or vomiting. Associated with: started after doing some stretching exercises. She has tried acetaminophen for the symptoms. The treatment provided no relief.   Lab Results  Component Value Date   NA 140 12/17/2020   K 4.4 12/17/2020   CO2 20 12/17/2020   GLUCOSE 108 (H) 12/17/2020   BUN 12 12/17/2020   CREATININE 0.70 12/17/2020   CALCIUM 9.7 12/17/2020   EGFR 91 12/17/2020   GFRNONAA 76 06/08/2019   Lab Results  Component Value Date   CHOL 221 (H) 12/17/2020   HDL 89 12/17/2020   LDLCALC 106 (H) 12/17/2020   TRIG 151 (H) 12/17/2020   CHOLHDL 2.5 12/17/2020   Lab Results  Component Value Date   TSH 0.420 (L) 12/17/2020   Lab Results  Component Value Date   HGBA1C 5.0 09/02/2016   Lab Results  Component Value Date   WBC 11.0 (H) 12/17/2020   HGB 14.3 12/17/2020   HCT 43.3 12/17/2020   MCV 97 12/17/2020   PLT 237 12/17/2020   Lab Results  Component Value Date   ALT 11 12/17/2020   AST 26 12/17/2020   ALKPHOS 64 12/17/2020   BILITOT 0.5 12/17/2020   Lab Results  Component Value Date   VD25OH 56 11/29/2019     Review of Systems  Constitutional:  Negative for chills, fatigue and fever.  Respiratory:  Positive for shortness of breath. Negative for cough and chest tightness.   Cardiovascular:  Negative for chest pain and palpitations.  Gastrointestinal:  Positive for abdominal pain.  Negative for abdominal distention, blood in stool, constipation, diarrhea, nausea and vomiting.  Psychiatric/Behavioral:  Negative for sleep disturbance.    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 Vira Agar   KNEE SURGERY     five different surgery    Social History   Tobacco Use   Smoking status: Every Day  Packs/day: 0.15    Years: 40.00    Pack years: 6.00    Types: Cigarettes   Smokeless tobacco: Never   Tobacco comments:    2-3 cigarettes daily  Vaping Use   Vaping Use: Never used  Substance Use Topics   Alcohol use: No   Drug use: No     Medication list has been reviewed and updated.  Current Meds  Medication Sig   CALCIUM-VITAMIN D PO Take by mouth daily.   cetirizine (ZYRTEC) 10 MG tablet Take 1 tablet (10 mg total) by mouth daily.   famotidine (PEPCID) 10 MG tablet Take 10 mg by mouth daily.   ferrous sulfate 325 (65 FE) MG tablet Take 325 mg by mouth daily with breakfast.   fluticasone  furoate-vilanterol (BREO ELLIPTA) 100-25 MCG/ACT AEPB Inhale 1 puff into the lungs daily.   furosemide (LASIX) 20 MG tablet Take 1 tablet (20 mg total) by mouth daily as needed.   gabapentin (NEURONTIN) 100 MG capsule Take 1 capsule (100 mg total) by mouth 4 (four) times daily.   levothyroxine (SYNTHROID) 75 MCG tablet Take 1 tablet (75 mcg total) by mouth daily before breakfast.   Multiple Vitamins-Minerals (MULTIPLE VITAMINS/WOMENS PO) Take by mouth.   VENTOLIN HFA 108 (90 Base) MCG/ACT inhaler INHALE 2 PUFFS BY MOUTH EVERY 6 HOURS ASNEEDED WHEEZING/ SHORTNESS OF BREATH   Zoledronic Acid (RECLAST IV) Inject into the vein.    PHQ 2/9 Scores 07/10/2021 06/11/2021 05/06/2021 12/17/2020  PHQ - 2 Score 0 0 0 0  PHQ- 9 Score 0 0 - 0    GAD 7 : Generalized Anxiety Score 07/10/2021 06/11/2021 12/17/2020 08/09/2020  Nervous, Anxious, on Edge 0 0 0 0  Control/stop worrying 0 0 0 0  Worry too much - different things 0 0 0 0  Trouble relaxing 0 0 0 0  Restless 0 0 0 0  Easily annoyed or irritable 0 0 0 0  Afraid - awful might happen 0 0 0 0  Total GAD 7 Score 0 0 0 0  Anxiety Difficulty - Not difficult at all Not difficult at all Not difficult at all    BP Readings from Last 3 Encounters:  07/10/21 124/80  06/11/21 124/76  02/22/21 128/82    Physical Exam Vitals and nursing note reviewed.  Constitutional:      General: She is not in acute distress.    Appearance: She is well-developed.  HENT:     Head: Normocephalic and atraumatic.  Cardiovascular:     Heart sounds: Normal heart sounds.  Pulmonary:     Effort: Pulmonary effort is normal. No respiratory distress.     Breath sounds: Decreased breath sounds present.  Abdominal:     General: Abdomen is flat. Bowel sounds are normal.     Palpations: Abdomen is soft.     Tenderness: There is no guarding or rebound. Negative signs include Murphy's sign.     Comments: Mild tenderness RUQ when tensing the abd wall muscles  Skin:    General: Skin  is warm and dry.     Findings: No rash.  Neurological:     Mental Status: She is alert and oriented to person, place, and time.  Psychiatric:        Mood and Affect: Mood normal.        Behavior: Behavior normal.    Wt Readings from Last 3 Encounters:  07/10/21 117 lb (53.1 kg)  06/11/21 118 lb (53.5 kg)  02/22/21 115 lb (52.2 kg)  BP 124/80    Pulse 97    Ht 5' 4"  (1.626 m)    Wt 117 lb (53.1 kg)    SpO2 97%    BMI 20.08 kg/m   Assessment and Plan: 1. Abdominal wall pain Avoid stretching excessively for the new few weeks As long as the discomfort continues to improve, no other evaluation is needed  2. Mild intermittent asthma, unspecified whether complicated Continue Breo and Albuterol MDI Ask the pharmacist for an early fill for vacation next week   Partially dictated using Editor, commissioning. Any errors are unintentional.  Halina Maidens, MD [facility]  07/10/2021

## 2021-07-22 ENCOUNTER — Ambulatory Visit
Admission: RE | Admit: 2021-07-22 | Discharge: 2021-07-22 | Disposition: A | Discharge status: Home / Self Care | Payer: Medicare Other | Source: Ambulatory Visit | Attending: Internal Medicine | Admitting: Internal Medicine

## 2021-07-22 ENCOUNTER — Other Ambulatory Visit: Payer: Self-pay

## 2021-07-22 DIAGNOSIS — Z1231 Encounter for screening mammogram for malignant neoplasm of breast: Secondary | ICD-10-CM | POA: Diagnosis not present

## 2021-07-24 ENCOUNTER — Other Ambulatory Visit: Payer: Self-pay

## 2021-07-24 ENCOUNTER — Telehealth: Payer: Self-pay | Admitting: Internal Medicine

## 2021-07-24 MED ORDER — FLUTICASONE-SALMETEROL 100-50 MCG/ACT IN AEPB
1.0000 | INHALATION_SPRAY | Freq: Two times a day (BID) | RESPIRATORY_TRACT | 1 refills | Status: DC
Start: 2021-07-24 — End: 2021-07-25

## 2021-07-24 NOTE — Telephone Encounter (Signed)
Pt called in for assistance. Pt says that she is currently taking fluticasone furoate-vilanterol (BREO ELLIPTA) 100-25 MCG/ACT AEPB.  Pt say that inhaler isn't helping. Pt would like to switch back to previous Rx for SYMBICORT 160-4.5 MCG/ACT inhale. She says it help better.      Please assist pt further.  [phone].

## 2021-07-24 NOTE — Telephone Encounter (Signed)
Called and left pt a VM informing of this information. Told her to call back with any questions or if she would like to pay out of pocket for the Symbicort ( 500+ dollars)

## 2021-07-24 NOTE — Telephone Encounter (Signed)
Mandy called from [place] requesting a call back for clarity on Rx from today. She says it is not available the way that it is currently prescribed. Please advise  fluticasone-salmeterol (ADVAIR) 100-50 MCG/ACT AEPB

## 2021-07-25 ENCOUNTER — Telehealth: Payer: Self-pay

## 2021-07-25 ENCOUNTER — Other Ambulatory Visit: Payer: Self-pay

## 2021-07-25 DIAGNOSIS — J4520 Mild intermittent asthma, uncomplicated: Secondary | ICD-10-CM

## 2021-07-25 MED ORDER — SYMBICORT 160-4.5 MCG/ACT IN AERO: INHALATION_SPRAY | RESPIRATORY_TRACT | 2 refills | Status: DC

## 2021-07-25 NOTE — Telephone Encounter (Signed)
Called patient and left a VM informing her that we sent in Symbicort for her asthma. Explain I completed a prior auth with her insurance company to see if they will approve now that she has tried and failed BREO inhaler.  I will let her know when insurance responds. Told her to call back with any questions for this.

## 2021-07-25 NOTE — Telephone Encounter (Signed)
Called and canceled RX. Sent in Symbicort in its place and completed PA. Waiting on outcome from insurance.

## 2021-07-25 NOTE — Telephone Encounter (Signed)
Completed PA on [email or web address] for Symbicort 160-4.5 inhaler for patient.  KeyBill Salinas - PA Case ID: [id number]  Awaiting outcome and will inform patient.

## 2021-07-26 ENCOUNTER — Other Ambulatory Visit: Payer: Self-pay | Admitting: Internal Medicine

## 2021-07-26 DIAGNOSIS — J4520 Mild intermittent asthma, uncomplicated: Secondary | ICD-10-CM

## 2021-07-26 MED ORDER — PULMICORT FLEXHALER 180 MCG/ACT IN AEPB: 1.0000 | INHALATION_SPRAY | Freq: Two times a day (BID) | RESPIRATORY_TRACT | 0 refills | Status: DC

## 2021-07-26 NOTE — Telephone Encounter (Signed)
PA Denied. Given to Dr Army Melia for Review.

## 2021-07-29 ENCOUNTER — Telehealth: Payer: Self-pay

## 2021-07-29 NOTE — Telephone Encounter (Signed)
Copied from [place] [phone]. Topic: General - Other >> Jul 29, 2021  1:12 PM Leward Quan A wrote: Reason for CRM: Patient called in asking Chastity to call her in regards to a new medication she stated that it is complicated so she would really appreciate a call back please at Ph#  [phone]

## 2021-07-29 NOTE — Telephone Encounter (Signed)
Spoke to pt let her know that her Symbicort inhaler was denied by insurance that Pulmicort was sent in instead. Pt verbalized understanding.  KP

## 2021-08-07 ENCOUNTER — Other Ambulatory Visit: Payer: Self-pay | Admitting: Internal Medicine

## 2021-08-07 DIAGNOSIS — J4520 Mild intermittent asthma, uncomplicated: Secondary | ICD-10-CM

## 2021-08-07 DIAGNOSIS — E034 Atrophy of thyroid (acquired): Secondary | ICD-10-CM

## 2021-08-08 NOTE — Telephone Encounter (Signed)
Requested Prescriptions  ?Pending Prescriptions Disp Refills  ?? levothyroxine (SYNTHROID) 75 MCG tablet [Pharmacy Med Name: LEVOTHYROXINE SODIUM 75 MCG TAB] 90 tablet 0  ?  Sig: TAKE 1 TABLET BY MOUTH ONCE DAILY ON AN EMPTY STOMACH. WAIT 30 MINUTES BEFORE TAKING OTHER MEDS.  ?  ? Endocrinology:  Hypothyroid Agents Failed - 08/07/2021  5:58 PM  ?  ?  Failed - TSH in normal range and within 360 days  ?  TSH  ?Date Value Ref Range Status  ?12/17/2020 0.420 (L) 0.450 - 4.500 uIU/mL Final  ?   ?  ?  Passed - Valid encounter within last 12 months  ?  Recent Outpatient Visits   ?      ? 4 weeks ago Abdominal wall pain  ? [facility] Glean Hess, MD  ? 1 month ago Mild intermittent asthma, unspecified whether complicated  ? [facility] Glean Hess, MD  ? 7 months ago Hypothyroidism due to acquired atrophy of thyroid  ? [facility] Glean Hess, MD  ? 12 months ago Mild intermittent asthma, unspecified whether complicated  ? [facility] Glean Hess, MD  ? 1 year ago Mild ankle edema  ? [facility] Glean Hess, MD  ?  ?  ?Future Appointments   ?        ? In 4 months Army Melia Jesse Sans, MD [facility], [facility]  ?  ? ?  ?  ?  ?? VENTOLIN HFA 108 (90 Base) MCG/ACT inhaler [Pharmacy Med Name: VENTOLIN HFA 108 (90 BASE) MCG/ACT] 18 g 2  ?  Sig: INHALE 2 PUFFS BY MOUTH EVERY 6 HOURS ASNEEDED WHEEZING/ SHORTNESS OF BREATH  ?  ? Pulmonology:  Beta Agonists 2 Passed - 08/07/2021  5:58 PM  ?  ?  Passed - Last BP in normal range  ?  BP Readings from Last 1 Encounters:  ?07/10/21 124/80  ?   ?  ?  Passed - Last Heart Rate in normal range  ?  Pulse Readings from Last 1 Encounters:  ?07/10/21 97  ?   ?  ?  Passed - Valid encounter within last 12 months  ?  Recent Outpatient Visits   ?      ? 4 weeks ago Abdominal wall pain  ? [facility] Glean Hess, MD  ? 1 month ago Mild intermittent asthma, unspecified whether complicated  ? [facility] Glean Hess, MD  ? 7 months ago Hypothyroidism due to acquired atrophy of thyroid  ? [facility] Glean Hess, MD  ? 12 months ago Mild intermittent asthma, unspecified whether complicated  ? [facility] Glean Hess, MD  ? 1 year ago Mild ankle edema  ? [facility] Glean Hess, MD  ?  ?  ?Future Appointments   ?        ? In 4 months Army Melia Jesse Sans, MD [facility], [place]  ?  ? ?  ?  ?  ? ? ?

## 2021-08-27 DIAGNOSIS — M810 Age-related osteoporosis without current pathological fracture: Secondary | ICD-10-CM | POA: Diagnosis not present

## 2021-08-27 DIAGNOSIS — Z8639 Personal history of other endocrine, nutritional and metabolic disease: Secondary | ICD-10-CM | POA: Diagnosis not present

## 2021-08-29 LAB — HM DEXA SCAN

## 2021-09-03 DIAGNOSIS — E039 Hypothyroidism, unspecified: Secondary | ICD-10-CM | POA: Diagnosis not present

## 2021-09-03 DIAGNOSIS — F17210 Nicotine dependence, cigarettes, uncomplicated: Secondary | ICD-10-CM | POA: Diagnosis not present

## 2021-09-03 DIAGNOSIS — M810 Age-related osteoporosis without current pathological fracture: Secondary | ICD-10-CM | POA: Diagnosis not present

## 2021-09-15 ENCOUNTER — Other Ambulatory Visit: Payer: Self-pay | Admitting: Internal Medicine

## 2021-09-15 DIAGNOSIS — R202 Paresthesia of skin: Secondary | ICD-10-CM

## 2021-09-16 NOTE — Telephone Encounter (Signed)
Refilled 06/11/2021 #360 1 refill a 6 month supply. ?Requested Prescriptions  ?Pending Prescriptions Disp Refills  ?? gabapentin (NEURONTIN) 100 MG capsule [Pharmacy Med Name: GABAPENTIN 100 MG CAP] 360 capsule 1  ?  Sig: TAKE 1 CAPSULE BY MOUTH 4 TIMES DAILY  ?  ? Neurology: Anticonvulsants - gabapentin Passed - 09/15/2021  3:47 PM  ?  ?  Passed - Cr in normal range and within 360 days  ?  Creat  ?Date Value Ref Range Status  ?09/02/2016 0.65 0.50 - 0.99 mg/dL Final  ?  Comment:  ?    ?For patients > or = 74 years of age: The upper reference limit for ?Creatinine is approximately 13% higher for people identified as ?African-American. ?  ?  ? ?Creatinine, Ser  ?Date Value Ref Range Status  ?12/17/2020 0.70 0.57 - 1.00 mg/dL Final  ?   ?  ?  Passed - Completed PHQ-2 or PHQ-9 in the last 360 days  ?  ?  Passed - Valid encounter within last 12 months  ?  Recent Outpatient Visits   ?      ? 2 months ago Abdominal wall pain  ? [facility] Glean Hess, MD  ? 3 months ago Mild intermittent asthma, unspecified whether complicated  ? [facility] Glean Hess, MD  ? 9 months ago Hypothyroidism due to acquired atrophy of thyroid  ? [facility] Glean Hess, MD  ? 1 year ago Mild intermittent asthma, unspecified whether complicated  ? [facility] Glean Hess, MD  ? 1 year ago Mild ankle edema  ? [facility] Glean Hess, MD  ?  ?  ?Future Appointments   ?        ? In 2 months Army Melia Jesse Sans, MD [facility], [place]  ?  ? ?  ?  ?  ? ?

## 2021-09-25 DIAGNOSIS — M810 Age-related osteoporosis without current pathological fracture: Secondary | ICD-10-CM | POA: Diagnosis not present

## 2021-10-16 ENCOUNTER — Encounter: Payer: Self-pay | Admitting: Internal Medicine

## 2021-10-16 ENCOUNTER — Ambulatory Visit (INDEPENDENT_AMBULATORY_CARE_PROVIDER_SITE_OTHER): Payer: Medicare Other | Admitting: Internal Medicine

## 2021-10-16 VITALS — BP 136/80 | HR 86 | Ht 64.0 in | Wt 114.2 lb

## 2021-10-16 DIAGNOSIS — K219 Gastro-esophageal reflux disease without esophagitis: Secondary | ICD-10-CM | POA: Diagnosis not present

## 2021-10-16 DIAGNOSIS — R109 Unspecified abdominal pain: Secondary | ICD-10-CM | POA: Diagnosis not present

## 2021-10-16 DIAGNOSIS — J4520 Mild intermittent asthma, uncomplicated: Secondary | ICD-10-CM | POA: Diagnosis not present

## 2021-10-16 MED ORDER — OMEPRAZOLE 20 MG PO CPDR: 20.0000 mg | DELAYED_RELEASE_CAPSULE | Freq: Every day | ORAL | 0 refills | Status: DC

## 2021-10-16 MED ORDER — TRELEGY ELLIPTA 100-62.5-25 MCG/ACT IN AEPB: 1.0000 | INHALATION_SPRAY | Freq: Every day | RESPIRATORY_TRACT | 11 refills | Status: DC

## 2021-10-16 NOTE — Progress Notes (Signed)
? ? ?Date:  10/16/2021  ? ?Name:  Lindsey Golden   DOB:  [birth year]   MRN:  [id number] ? ? ?Chief Complaint: Gastroesophageal Reflux (Uses pepcid BID, helps but not enough. Yesterday took [place], and Pepcid. Burning down in chest is getting worse.), Asthma (On Pulmicort, but its no longer helping.), and Abdominal Pain (Right sided. Intermittent. Comes for about 30 mins at a time. Pain is described as sharp. Still has gall bladder, and appendix. ) ? ?Gastroesophageal Reflux ?She complains of abdominal pain, dysphagia, heartburn and wheezing. She reports no chest pain or no coughing. This is a recurrent problem. The problem has been gradually worsening. Pertinent negatives include no fatigue. She has tried a histamine-2 antagonist and an antacid for the symptoms. The treatment provided moderate relief. Past procedures include an abdominal ultrasound and H. pylori antibody titer. Past procedures do not include an EGD.  ?Asthma ?She complains of wheezing. There is no cough or shortness of breath. This is a recurrent problem. Associated symptoms include heartburn. Pertinent negatives include no chest pain or fever. Her symptoms are alleviated by beta-agonist and steroid inhaler. She reports moderate improvement on treatment. Her past medical history is significant for asthma.  ?Abdominal Pain ?This is a recurrent problem. The pain is located in the RUQ (and over right sided anterior ribs). The quality of the pain is colicky and cramping. Pertinent negatives include no constipation, diarrhea or fever. Her past medical history is significant for GERD.  ? ?SDOH Interventions   ? ?Flowsheet Row Most Recent Value  ?SDOH Interventions   ?Food Insecurity Interventions Intervention Not Indicated  ?Financial Strain Interventions Intervention Not Indicated  ?Housing Interventions Intervention Not Indicated  ?Intimate Partner Violence Interventions Intervention Not Indicated  ?Stress Interventions Intervention Not Indicated   ?Transportation Interventions Intervention Not Indicated  ? ?  ?  ? ?Lab Results  ?Component Value Date  ? NA 140 12/17/2020  ? K 4.4 12/17/2020  ? CO2 20 12/17/2020  ? GLUCOSE 108 (H) 12/17/2020  ? BUN 12 12/17/2020  ? CREATININE 0.70 12/17/2020  ? CALCIUM 9.7 12/17/2020  ? EGFR 91 12/17/2020  ? GFRNONAA 76 06/08/2019  ? ?Lab Results  ?Component Value Date  ? CHOL 221 (H) 12/17/2020  ? HDL 89 12/17/2020  ? LDLCALC 106 (H) 12/17/2020  ? TRIG 151 (H) 12/17/2020  ? CHOLHDL 2.5 12/17/2020  ? ?Lab Results  ?Component Value Date  ? TSH 0.420 (L) 12/17/2020  ? ?Lab Results  ?Component Value Date  ? HGBA1C 5.0 09/02/2016  ? ?Lab Results  ?Component Value Date  ? WBC 11.0 (H) 12/17/2020  ? HGB 14.3 12/17/2020  ? HCT 43.3 12/17/2020  ? MCV 97 12/17/2020  ? PLT 237 12/17/2020  ? ?Lab Results  ?Component Value Date  ? ALT 11 12/17/2020  ? AST 26 12/17/2020  ? ALKPHOS 64 12/17/2020  ? BILITOT 0.5 12/17/2020  ? ?Lab Results  ?Component Value Date  ? VD25OH 56 11/29/2019  ?  ? ?Review of Systems  ?Constitutional:  Negative for chills, fatigue, fever and unexpected weight change.  ?Respiratory:  Positive for chest tightness and wheezing. Negative for cough and shortness of breath.   ?Cardiovascular:  Negative for chest pain and palpitations.  ?Gastrointestinal:  Positive for abdominal pain, dysphagia and heartburn. Negative for blood in stool, constipation and diarrhea.  ?Psychiatric/Behavioral:  Negative for dysphoric mood and sleep disturbance. The patient is not nervous/anxious.   ? ?Patient Active Problem List  ? Diagnosis Date Noted  ? Mild intermittent  asthma 07/10/2021  ? Lower leg edema 12/17/2020  ? Mixed hyperlipidemia 12/17/2020  ? History of Helicobacter pylori infection 07/01/2019  ? GERD without esophagitis 06/08/2019  ? Age-related osteoporosis without current pathological fracture 06/08/2019  ? Hypothyroidism due to acquired atrophy of thyroid 11/02/2018  ? Vitamin D deficiency 02/16/2018  ? Tingling in  extremities 02/16/2018  ? Myalgia 02/02/2018  ? Adenomatous colon polyp 10/07/2017  ? 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 Environmental and seasonal allergies 05/13/2016  ? Degenerative arthritis of knee, bilateral 11/26/2015  ? Asthma 11/28/2014  ? ? ?Allergies  ?Allergen Reactions  ? Aspirin Hives  ? Penicillins Other (See Comments)  ? Dairy Aid [Tilactase] Diarrhea  ? ? ?Past Surgical History:  ?Procedure Laterality Date  ? arm surgery    ? 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 BREAST BIOPSY Bilateral 1971  ? benign  ? BREAST SURGERY    ? COLONOSCOPY  03/19/2012  ? tubular adenoma - repeat 5 years Dr. Vira Agar  ? KNEE SURGERY    ? five different surgery  ? ? ?Social History  ? ?Tobacco Use  ? Smoking status: Some Days  ?  Packs/day: 0.15  ?  Years: 40.00  ?  Pack years: 6.00  ?  Types: Cigarettes  ? Smokeless tobacco: Never  ? Tobacco comments:  ?  2-3 cigarettes daily  ?Vaping Use  ? Vaping Use: Never used  ?Substance Use Topics  ? Alcohol use: No  ? Drug use: No  ? ? ? ?Medication list has been reviewed and updated. ? ?Current Meds  ?Medication Sig  ? budesonide (PULMICORT FLEXHALER) 180 MCG/ACT inhaler Inhale 1 puff into the lungs 2 (two) times daily.  ? CALCIUM-VITAMIN D PO Take by mouth daily.  ? cetirizine (ZYRTEC) 10 MG tablet Take 1 tablet (10 mg total) by mouth daily.  ? famotidine (PEPCID) 10 MG tablet Take 10 mg by mouth daily.  ? ferrous sulfate 325 (65 FE) MG tablet Take 325 mg by mouth daily with breakfast.  ? furosemide (LASIX) 20 MG tablet Take 1 tablet (20 mg total) by mouth daily as needed.  ? gabapentin (NEURONTIN) 100 MG capsule Take 1 capsule (100 mg total) by mouth 4 (four) times daily.  ? levothyroxine (SYNTHROID) 75 MCG tablet TAKE 1 TABLET BY MOUTH ONCE DAILY ON AN EMPTY STOMACH. WAIT 30 MINUTES BEFORE TAKING OTHER MEDS.  ? Multiple Vitamins-Minerals (MULTIPLE VITAMINS/WOMENS PO) Take by mouth.  ? VENTOLIN HFA 108 (90 Base) MCG/ACT inhaler INHALE 2 PUFFS BY  MOUTH EVERY 6 HOURS ASNEEDED WHEEZING/ SHORTNESS OF BREATH  ? Zoledronic Acid (RECLAST IV) Inject into the vein.  ? ? ? ?  07/10/2021  ? 10:31 AM 06/11/2021  ?  1:42 PM 12/17/2020  ? 10:49 AM 08/09/2020  ?  8:14 AM  ?GAD 7 : Generalized Anxiety Score  ?Nervous, Anxious, on Edge 0 0 0 0  ?Control/stop worrying 0 0 0 0  ?Worry too much - different things 0 0 0 0  ?Trouble relaxing 0 0 0 0  ?Restless 0 0 0 0  ?Easily annoyed or irritable 0 0 0 0  ?Afraid - awful might happen 0 0 0 0  ?Total GAD 7 Score 0 0 0 0  ?Anxiety Difficulty  Not difficult at all Not difficult at all Not difficult at all  ? ? ? ?  07/10/2021  ? 10:31 AM  ?Depression screen PHQ 2/9  ?Decreased Interest 0  ?Down, Depressed, Hopeless 0  ?PHQ -  2 Score 0  ?Altered sleeping 0  ?Tired, decreased energy 0  ?Change in appetite 0  ?Feeling bad or failure about yourself  0  ?Trouble concentrating 0  ?Moving slowly or fidgety/restless 0  ?Suicidal thoughts 0  ?PHQ-9 Score 0  ?Difficult doing work/chores Not difficult at all  ? ? ?BP Readings from Last 3 Encounters:  ?10/16/21 136/80  ?07/10/21 124/80  ?06/11/21 124/76  ? ? ?Physical Exam ?Vitals and nursing note reviewed.  ?Constitutional:   ?   General: She is not in acute distress. ?   Appearance: She is well-developed.  ?HENT:  ?   Head: Normocephalic and atraumatic.  ?Pulmonary:  ?   Effort: Pulmonary effort is normal. No respiratory distress.  ?   Breath sounds: Examination of the right-lower field reveals wheezing. Examination of the left-lower field reveals wheezing. Wheezing present.  ?Abdominal:  ?   General: Abdomen is flat. Bowel sounds are normal.  ?   Palpations: Abdomen is soft. There is no fluid wave, hepatomegaly or splenomegaly.  ?   Tenderness: There is no abdominal tenderness. There is no right CVA tenderness, left CVA tenderness, guarding or rebound.  ?Skin: ?   General: Skin is warm and dry.  ?   Findings: No rash.  ?Neurological:  ?   Mental Status: She is alert and oriented to person, place,  and time.  ?Psychiatric:     ?   Mood and Affect: Mood normal.     ?   Behavior: Behavior normal.  ? ? ?Wt Readings from Last 3 Encounters:  ?10/16/21 114 lb 3.2 oz (51.8 kg)  ?07/10/21 117 lb (53.1 kg)  ?06/11/21 11

## 2021-10-18 ENCOUNTER — Encounter: Payer: Self-pay | Admitting: Internal Medicine

## 2021-11-05 ENCOUNTER — Other Ambulatory Visit: Payer: Self-pay | Admitting: Internal Medicine

## 2021-11-05 DIAGNOSIS — J4520 Mild intermittent asthma, uncomplicated: Secondary | ICD-10-CM

## 2021-11-06 ENCOUNTER — Other Ambulatory Visit: Payer: Self-pay | Admitting: Internal Medicine

## 2021-11-06 DIAGNOSIS — E034 Atrophy of thyroid (acquired): Secondary | ICD-10-CM

## 2021-11-06 NOTE — Telephone Encounter (Signed)
Discontinued 10/16/21.Not on current med profile Requested Prescriptions  Pending Prescriptions Disp Refills  . PULMICORT FLEXHALER 180 MCG/ACT inhaler [Pharmacy Med Name: Pulmicort Flexhaler 180 MCG/ACT Inhalation Aerosol Powder Breath Activated] 2 each 3    Sig: USE 1 INHALATION BY MOUTH TWICE  DAILY     Pulmonology:  Corticosteroids Passed - 11/05/2021 10:32 AM      Passed - Valid encounter within last 12 months    Recent Outpatient Visits          3 weeks ago Mild intermittent asthma, unspecified whether complicated   [facility] Glean Hess, MD   3 months ago Abdominal wall pain   [facility] Glean Hess, MD   4 months ago Mild intermittent asthma, unspecified whether complicated   [facility] Glean Hess, MD   10 months ago Hypothyroidism due to acquired atrophy of thyroid   [facility] Glean Hess, MD   1 year ago Mild intermittent asthma, unspecified whether complicated   [facility] Glean Hess, MD      Future Appointments            In 1 month [facility] Melia Jesse Sans, MD [facility] [place]   In 2 months [name], MD [place]

## 2021-11-07 NOTE — Telephone Encounter (Signed)
Requested Prescriptions  Pending Prescriptions Disp Refills  . levothyroxine (SYNTHROID) 75 MCG tablet [Pharmacy Med Name: LEVOTHYROXINE SODIUM 75 MCG TAB] 90 tablet 0    Sig: TAKE 1 TABLET BY MOUTH ONCE DAILY ON AN EMPTY STOMACH. WAIT 30 MINUTES BEFORE TAKING OTHER MEDS.     Endocrinology:  Hypothyroid Agents Failed - 11/06/2021  8:37 AM      Failed - TSH in normal range and within 360 days    TSH  Date Value Ref Range Status  12/17/2020 0.420 (L) 0.450 - 4.500 uIU/mL Final         Passed - Valid encounter within last 12 months    Recent Outpatient Visits          3 weeks ago Mild intermittent asthma, unspecified whether complicated   [facility] Glean Hess, MD   4 months ago Abdominal wall pain   [facility] Glean Hess, MD   4 months ago Mild intermittent asthma, unspecified whether complicated   [facility] Glean Hess, MD   10 months ago Hypothyroidism due to acquired atrophy of thyroid   [facility] Glean Hess, MD   1 year ago Mild intermittent asthma, unspecified whether complicated   [facility] Glean Hess, MD      Future Appointments            In 1 month [facility] Melia Jesse Sans, MD [facility] [place]   In 2 months [name], MD [place]

## 2021-11-11 ENCOUNTER — Other Ambulatory Visit: Payer: Self-pay | Admitting: Internal Medicine

## 2021-11-11 DIAGNOSIS — J4520 Mild intermittent asthma, uncomplicated: Secondary | ICD-10-CM

## 2021-11-12 NOTE — Telephone Encounter (Signed)
Requested Prescriptions  Pending Prescriptions Disp Refills  . VENTOLIN HFA 108 (90 Base) MCG/ACT inhaler [Pharmacy Med Name: VENTOLIN HFA 108 (90 BASE) MCG/ACT] 18 g 2    Sig: INHALE 2 PUFFS BY MOUTH EVERY 6 HOURS ASNEEDED WHEEZING/ SHORTNESS OF BREATH     Pulmonology:  Beta Agonists 2 Passed - 11/11/2021  3:19 PM      Passed - Last BP in normal range    BP Readings from Last 1 Encounters:  10/16/21 136/80         Passed - Last Heart Rate in normal range    Pulse Readings from Last 1 Encounters:  10/16/21 86         Passed - Valid encounter within last 12 months    Recent Outpatient Visits          3 weeks ago Mild intermittent asthma, unspecified whether complicated   [facility] Glean Hess, MD   4 months ago Abdominal wall pain   [facility] Glean Hess, MD   5 months ago Mild intermittent asthma, unspecified whether complicated   [facility] Glean Hess, MD   11 months ago Hypothyroidism due to acquired atrophy of thyroid   [facility] Glean Hess, MD   1 year ago Mild intermittent asthma, unspecified whether complicated   [facility] Glean Hess, MD      Future Appointments            In 4 weeks [facility] Melia Jesse Sans, MD [facility], [place]   In 2 months [name], MD [place]

## 2021-11-14 IMAGING — US US ABDOMEN LIMITED
1 series · 14 of 25 positions shown · non-contrast
Comparison: 8718.

CLINICAL DATA: Right upper quadrant pain

EXAM:
ULTRASOUND ABDOMEN LIMITED RIGHT UPPER QUADRANT

[Series 1: us abdomen limited · 0.17mm/px · 14 of 54 slices shown]
[im 1/54]
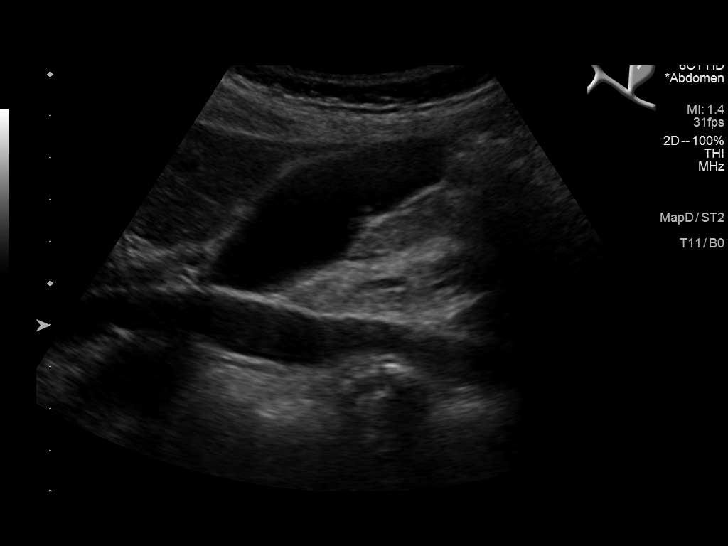
[im 5/54]
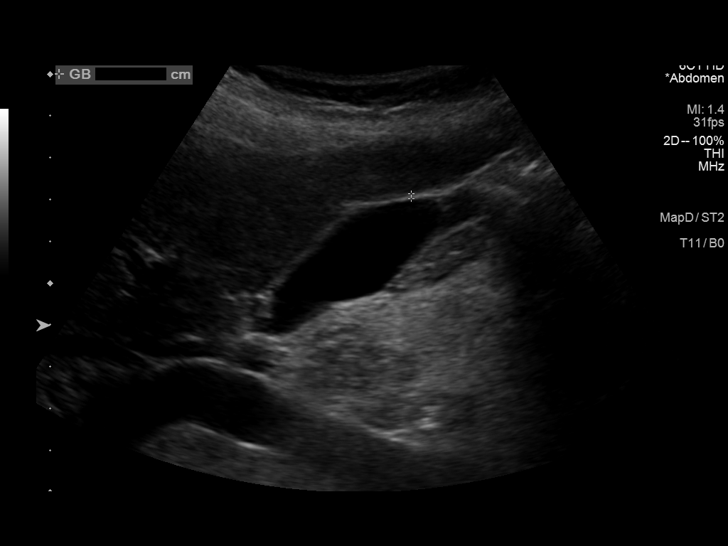
[im 9/54]
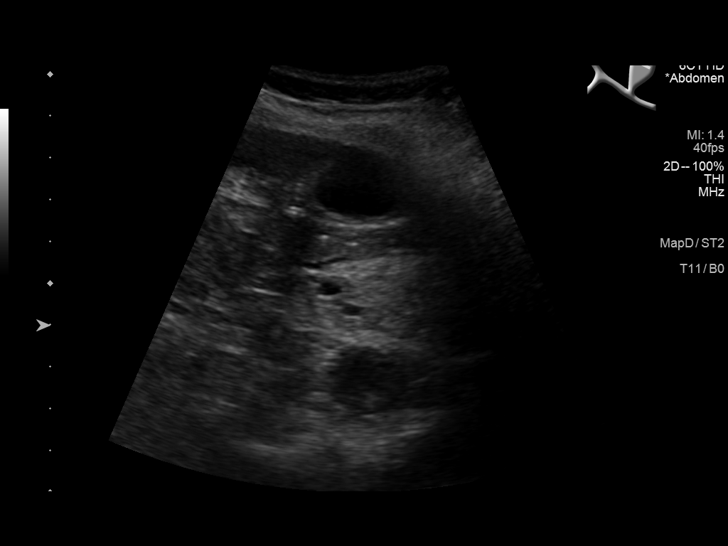
[im 14/54]
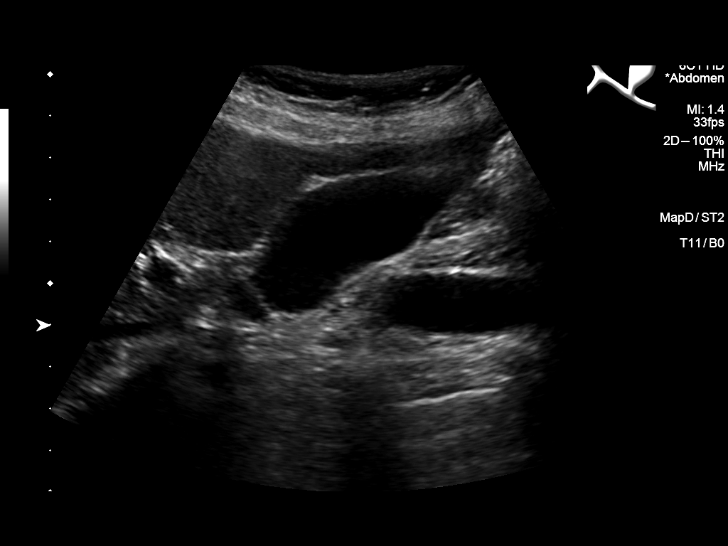
[im 18/54]
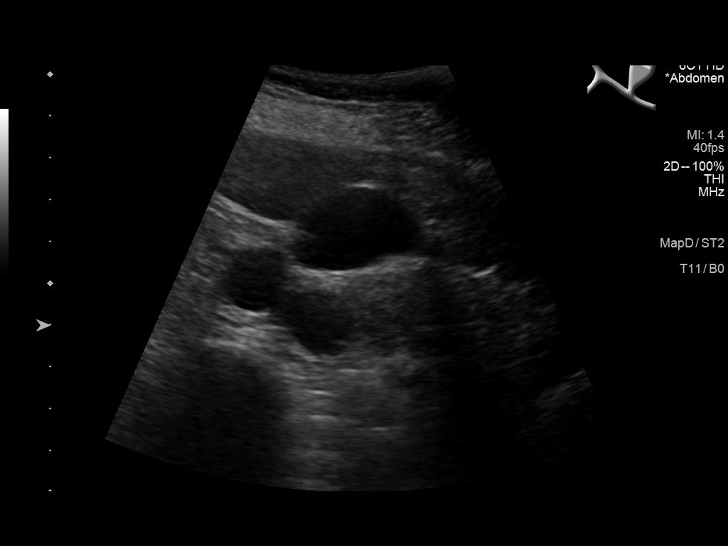
[im 20/54]
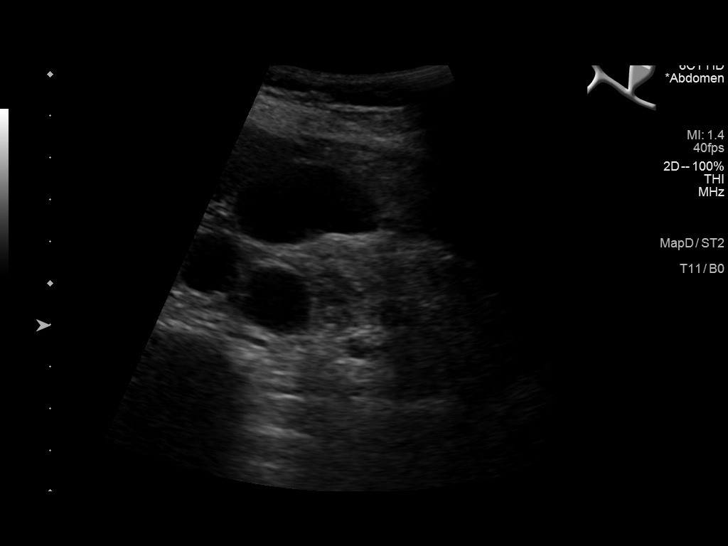
[im 25/54]
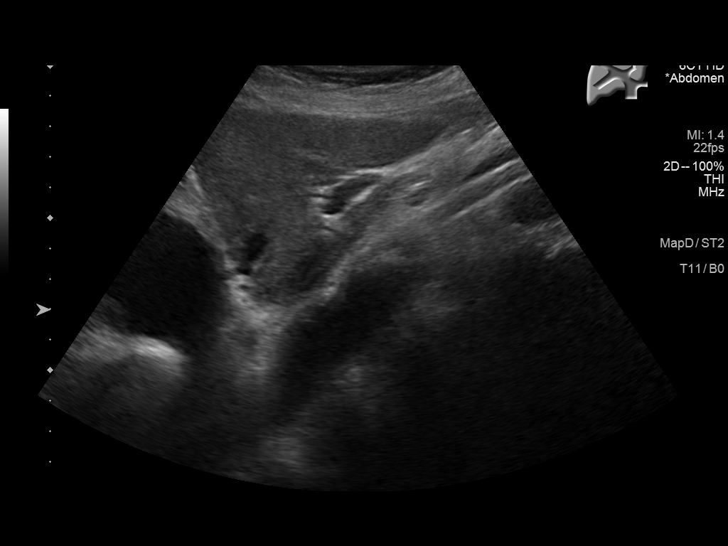
[im 29/54]
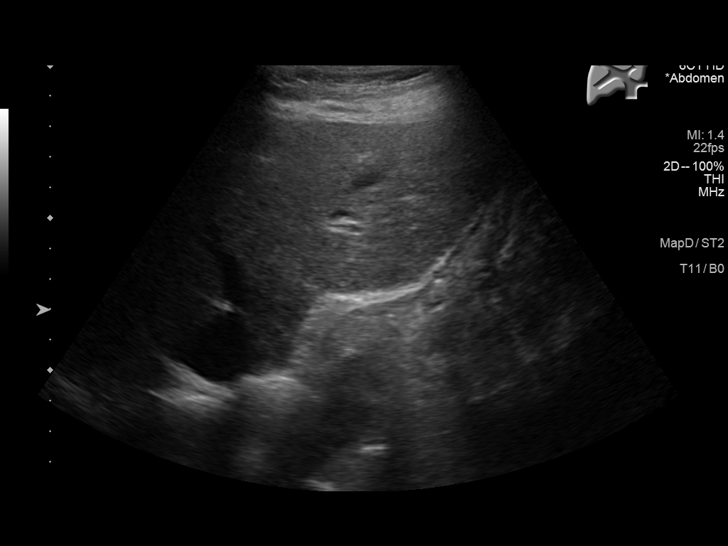
[im 34/54]
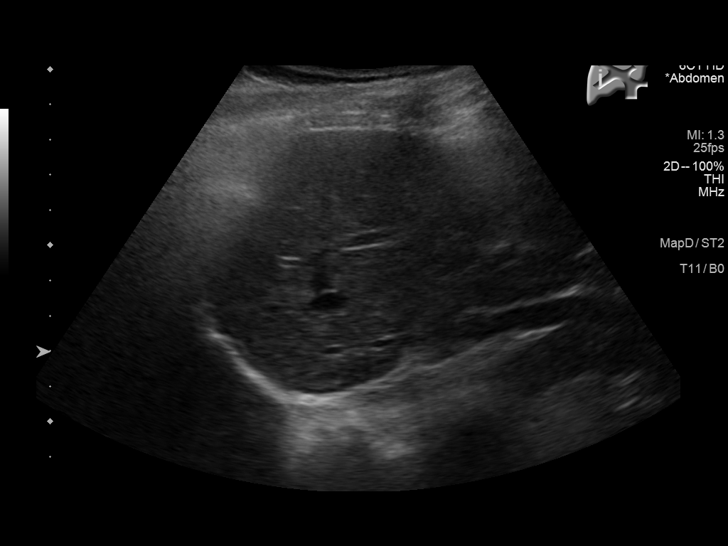
[im 36/54]
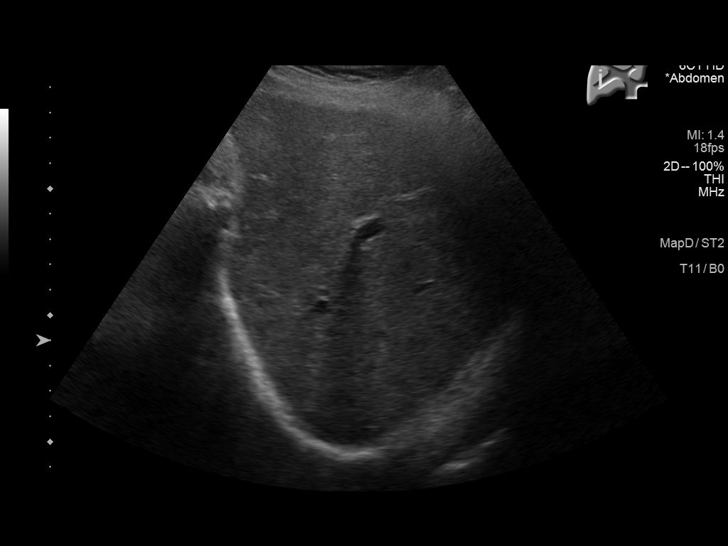
[im 40/54]
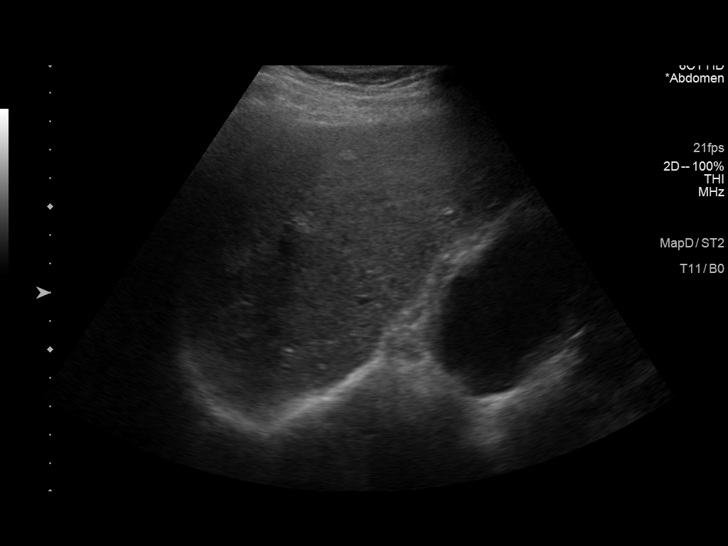
[im 45/54]
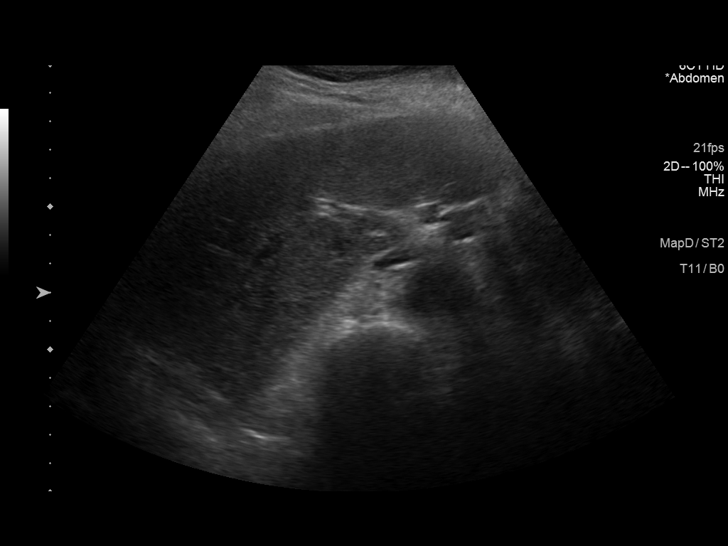
[im 49/54]
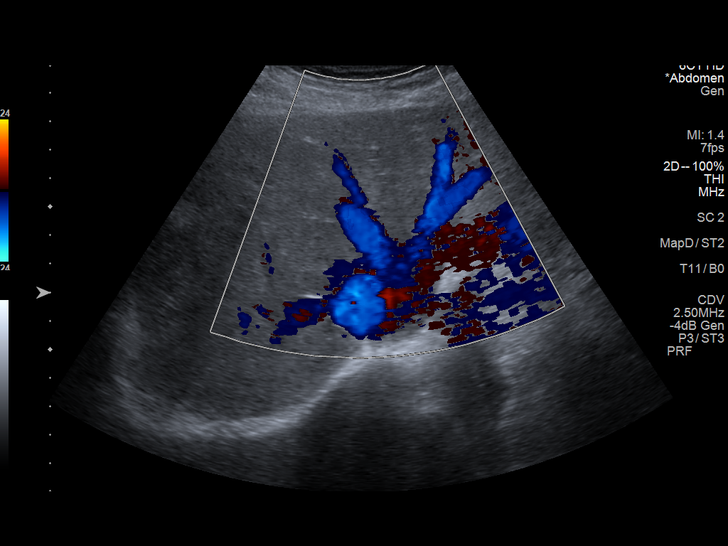
[im 54/54]
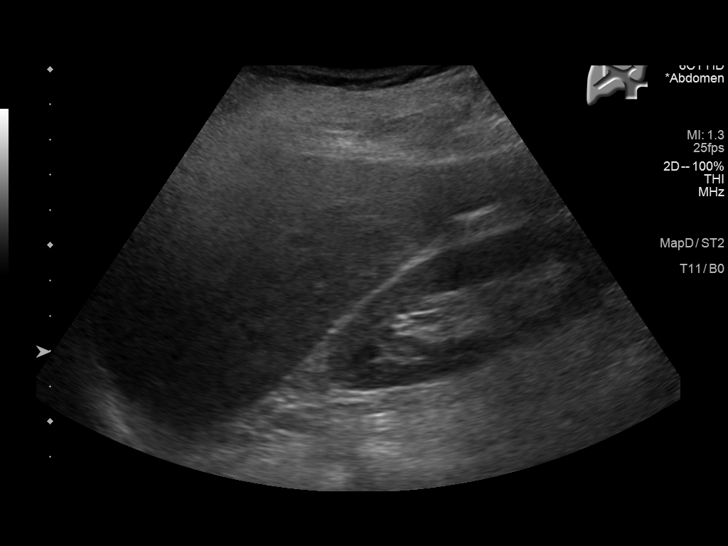

[14 of 25 positions shown; findings below may reference images not displayed]

FINDINGS: Gallbladder:

No gallstones or wall thickening visualized. No sonographic Murphy
sign noted by sonographer.

Common bile duct:

Diameter: 3 mm

Liver:

No focal lesion identified. Within normal limits in parenchymal
echogenicity. Portal vein is patent on color Doppler imaging with
normal direction of blood flow towards the liver.

Other: None.
IMPRESSION: Normal right upper quadrant ultrasound

## 2021-12-09 ENCOUNTER — Encounter: Payer: Medicare Other | Admitting: Internal Medicine

## 2021-12-11 ENCOUNTER — Ambulatory Visit (INDEPENDENT_AMBULATORY_CARE_PROVIDER_SITE_OTHER): Payer: Medicare Other | Admitting: Internal Medicine

## 2021-12-11 ENCOUNTER — Encounter: Payer: Self-pay | Admitting: Internal Medicine

## 2021-12-11 VITALS — BP 108/62 | HR 78 | Ht 64.0 in | Wt 116.0 lb

## 2021-12-11 DIAGNOSIS — J4520 Mild intermittent asthma, uncomplicated: Secondary | ICD-10-CM | POA: Diagnosis not present

## 2021-12-11 DIAGNOSIS — E782 Mixed hyperlipidemia: Secondary | ICD-10-CM

## 2021-12-11 DIAGNOSIS — R202 Paresthesia of skin: Secondary | ICD-10-CM | POA: Diagnosis not present

## 2021-12-11 DIAGNOSIS — K219 Gastro-esophageal reflux disease without esophagitis: Secondary | ICD-10-CM

## 2021-12-11 DIAGNOSIS — Z23 Encounter for immunization: Secondary | ICD-10-CM

## 2021-12-11 DIAGNOSIS — E034 Atrophy of thyroid (acquired): Secondary | ICD-10-CM | POA: Diagnosis not present

## 2021-12-11 DIAGNOSIS — M810 Age-related osteoporosis without current pathological fracture: Secondary | ICD-10-CM | POA: Diagnosis not present

## 2021-12-11 MED ORDER — GABAPENTIN 100 MG PO CAPS: 100.0000 mg | ORAL_CAPSULE | Freq: Four times a day (QID) | ORAL | 1 refills | Status: DC

## 2021-12-11 NOTE — Progress Notes (Signed)
Date:  12/11/2021   Name:  Lindsey Golden   DOB:  1945-09-20   MRN:  [id number]   Chief Complaint: Annual Exam Lindsey Golden is a 76 y.o. female who presents today for her Complete Annual Exam. She feels well. She reports exercising- arm, leg, and back stretching 3-4 days weekly, and walking. She reports she is sleeping well. Breast complaints - none.  Mammogram: 07/2021 DEXA: 05/2019 osteoporosis Pap smear: discontinued Colonoscopy: 01/2018  There are no preventive care reminders to display for this patient.   Immunization History  Administered Date(s) Administered   Influenza, High Dose Seasonal PF 04/05/2015, 03/18/2016, 03/06/2017, 03/18/2019, 04/16/2020, 04/04/2021   Influenza-Unspecified 03/24/2019   Moderna Covid-19 Vaccine Bivalent Booster 75yr & up 04/17/2021   PFIZER Comirnaty(Gray Top)Covid-19 Tri-Sucrose Vaccine 07/30/2019, 08/21/2019   PFIZER(Purple Top)SARS-COV-2 Vaccination 03/27/2020, 09/11/2020   Pneumococcal Conjugate-13 03/08/2014   Pneumococcal Polysaccharide-23 09/17/2012   Tdap 09/29/2014   Zoster Recombinat (Shingrix) 07/08/2018, 12/14/2018    Thyroid Problem Presents for follow-up visit. Patient reports no anxiety, constipation, diarrhea, fatigue, palpitations or tremors. The symptoms have been stable.  Asthma There is no cough, difficulty breathing, shortness of breath or wheezing. This is a recurrent problem. The problem occurs rarely (since starting Trelegy). Pertinent negatives include no chest pain, fever, headaches or trouble swallowing. Her past medical history is significant for asthma.    Lab Results  Component Value Date   NA 140 12/17/2020   K 4.4 12/17/2020   CO2 20 12/17/2020   GLUCOSE 108 (H) 12/17/2020   BUN 12 12/17/2020   CREATININE 0.70 12/17/2020   CALCIUM 9.7 12/17/2020   EGFR 91 12/17/2020   GFRNONAA 76 06/08/2019   Lab Results  Component Value Date   CHOL 221 (H) 12/17/2020   HDL 89 12/17/2020    LDLCALC 106 (H) 12/17/2020   TRIG 151 (H) 12/17/2020   CHOLHDL 2.5 12/17/2020   Lab Results  Component Value Date   TSH 0.420 (L) 12/17/2020   Lab Results  Component Value Date   HGBA1C 5.0 09/02/2016   Lab Results  Component Value Date   WBC 11.0 (H) 12/17/2020   HGB 14.3 12/17/2020   HCT 43.3 12/17/2020   MCV 97 12/17/2020   PLT 237 12/17/2020   Lab Results  Component Value Date   ALT 11 12/17/2020   AST 26 12/17/2020   ALKPHOS 64 12/17/2020   BILITOT 0.5 12/17/2020   Lab Results  Component Value Date   VD25OH 56 11/29/2019     Review of Systems  Constitutional:  Negative for chills, fatigue and fever.  HENT:  Negative for congestion, hearing loss, tinnitus, trouble swallowing and voice change.   Eyes:  Negative for visual disturbance.  Respiratory:  Negative for cough, chest tightness, shortness of breath and wheezing.   Cardiovascular:  Negative for chest pain, palpitations and leg swelling.  Gastrointestinal:  Negative for abdominal pain, constipation, diarrhea and vomiting.  Endocrine: Negative for polydipsia and polyuria.  Genitourinary:  Negative for dysuria, frequency, genital sores, vaginal bleeding and vaginal discharge.  Musculoskeletal:  Negative for arthralgias, gait problem and joint swelling.  Skin:  Negative for color change and rash.  Neurological:  Negative for dizziness, tremors, light-headedness and headaches.  Hematological:  Negative for adenopathy. Does not bruise/bleed easily.  Psychiatric/Behavioral:  Negative for dysphoric mood and sleep disturbance. The patient is not nervous/anxious.     Patient Active Problem List   Diagnosis Date Noted   Mild intermittent asthma 07/10/2021   Lower  leg edema 12/17/2020   Mixed hyperlipidemia 75/03/2584   History of Helicobacter pylori infection 07/01/2019   GERD without esophagitis 06/08/2019   Age-related osteoporosis without current pathological fracture 06/08/2019   Hypothyroidism due to  acquired atrophy of thyroid 11/02/2018   Vitamin D deficiency 02/16/2018   Tingling in extremities 02/16/2018   Myalgia 02/02/2018   Adenomatous colon polyp 10/07/2017   AVM (arteriovenous malformation) of small bowel, 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 Vira Agar   KNEE SURGERY     five different surgery    Social History   Tobacco Use   Smoking status: Some Days    Packs/day: 0.15    Years: 40.00    Total pack years: 6.00    Types: Cigarettes   Smokeless tobacco: Never   Tobacco comments:    2-3 cigarettes daily  Vaping Use   Vaping Use: Never used  Substance Use Topics   Alcohol use: No   Drug use: No     Medication list has been reviewed and updated.  Current Meds  Medication Sig   CALCIUM-VITAMIN D PO Take by mouth daily.   famotidine (PEPCID) 10 MG tablet Take 10 mg by mouth daily.   ferrous sulfate 325 (65 FE) MG tablet Take 325 mg by mouth daily with breakfast.   Fluticasone-Umeclidin-Vilant (TRELEGY ELLIPTA) 100-62.5-25 MCG/ACT AEPB Inhale 1 puff into the lungs daily.   furosemide (LASIX) 20 MG tablet Take 1 tablet (20 mg total) by mouth daily as needed.   gabapentin (NEURONTIN) 100 MG capsule Take 1 capsule (100 mg total) by mouth 4 (four) times daily.   levothyroxine (SYNTHROID) 75 MCG tablet TAKE 1 TABLET BY MOUTH ONCE DAILY ON AN EMPTY STOMACH. WAIT 30 MINUTES BEFORE TAKING OTHER MEDS.   Multiple Vitamins-Minerals (MULTIPLE VITAMINS/WOMENS PO) Take by mouth.   VENTOLIN HFA 108 (90 Base) MCG/ACT inhaler INHALE 2 PUFFS BY MOUTH  EVERY 6 HOURS ASNEEDED WHEEZING/ SHORTNESS OF BREATH   Zoledronic Acid (RECLAST IV) Inject into the vein.   [DISCONTINUED] cetirizine (ZYRTEC) 10 MG tablet Take 1 tablet (10 mg total) by mouth daily.   [DISCONTINUED] omeprazole (PRILOSEC) 20 MG capsule Take 1 capsule (20 mg total) by mouth daily.       10/16/2021    8:24 AM 07/10/2021   10:31 AM 06/11/2021    1:42 PM 12/17/2020   10:49 AM  GAD 7 : Generalized Anxiety Score  Nervous, Anxious, on Edge 0 0 0 0  Control/stop worrying 0 0 0 0  Worry too much - different things 0 0 0 0  Trouble relaxing 0 0 0 0  Restless 0 0 0 0  Easily annoyed or irritable 0 0 0 0  Afraid - awful might happen 0 0 0 0  Total GAD 7 Score 0 0 0 0  Anxiety Difficulty Not difficult at all  Not difficult at all Not difficult at all       10/16/2021    8:24 AM 07/10/2021   10:31 AM 06/11/2021  1:42 PM  Depression screen PHQ 2/9  Decreased Interest 0 0 0  Down, Depressed, Hopeless 0 0 0  PHQ - 2 Score 0 0 0  Altered sleeping 0 0 0  Tired, decreased energy 0 0 0  Change in appetite 0 0 0  Feeling bad or failure about yourself  0 0 0  Trouble concentrating 0 0 0  Moving slowly or fidgety/restless 0 0 0  Suicidal thoughts 0 0 0  PHQ-9 Score 0 0 0  Difficult doing work/chores Not difficult at all Not difficult at all Not difficult at all    BP Readings from Last 3 Encounters:  12/11/21 108/62  10/16/21 136/80  07/10/21 124/80    Physical Exam Vitals and nursing note reviewed.  Constitutional:      General: She is not in acute distress.    Appearance: She is well-developed.  HENT:     Head: Normocephalic and atraumatic.     Right Ear: Tympanic membrane and ear canal normal.     Left Ear: Tympanic membrane and ear canal normal.     Nose:     Right Sinus: No maxillary sinus tenderness.     Left Sinus: No maxillary sinus tenderness.  Eyes:     General: No scleral icterus.       Right eye: No discharge.        Left eye: No discharge.      Conjunctiva/sclera: Conjunctivae normal.  Neck:     Thyroid: No thyromegaly.     Vascular: No carotid bruit.  Cardiovascular:     Rate and Rhythm: Normal rate and regular rhythm.     Pulses: Normal pulses.     Heart sounds: Normal heart sounds.  Pulmonary:     Effort: Pulmonary effort is normal. No respiratory distress.     Breath sounds: No wheezing.  Chest:  Breasts:    Right: No mass, nipple discharge, skin change or tenderness.     Left: No mass, nipple discharge, skin change or tenderness.  Abdominal:     General: Bowel sounds are normal.     Palpations: Abdomen is soft.     Tenderness: There is no abdominal tenderness.  Musculoskeletal:     Cervical back: Normal range of motion. No erythema.     Right lower leg: No edema.     Left lower leg: No edema.     Comments: Atrophy of instrinic muscles of both hands noted  Lymphadenopathy:     Cervical: No cervical adenopathy.  Skin:    General: Skin is warm and dry.     Findings: No rash.  Neurological:     Mental Status: She is alert and oriented to person, place, and time.     Cranial Nerves: No cranial nerve deficit.     Sensory: No sensory deficit.     Deep Tendon Reflexes: Reflexes are normal and symmetric.  Psychiatric:        Attention and Perception: Attention normal.        Mood and Affect: Mood normal.     Wt Readings from Last 3 Encounters:  12/11/21 116 lb (52.6 kg)  10/16/21 114 lb 3.2 oz (51.8 kg)  07/10/21 117 lb (53.1 kg)    BP 108/62   Pulse 78   Ht 5' 4"  (1.626 m)   Wt 116 lb (52.6 kg)   SpO2 96%   BMI 19.91 kg/m   Assessment and Plan: 1. Mild intermittent asthma, unspecified whether complicated Much improved with Trelegly -  Comprehensive metabolic panel  2. GERD without esophagitis Symptoms well controlled on daily Pepeid No red flag signs such as weight loss, n/v, melena Will continue same therapy. - CBC with Differential/Platelet  3. Hypothyroidism due to acquired atrophy of  thyroid supplemented - TSH + free T4  4. Age-related osteoporosis without current pathological fracture Continue calcium and vitamin D along with Reclast infusions  5. Mixed hyperlipidemia Check labs and advise - Lipid panel  6. Need for vaccination for pneumococcus - Pneumococcal conjugate vaccine 20-valent  7. Tingling in extremities Chronic stable symptoms  Continue gabapentin - gabapentin (NEURONTIN) 100 MG capsule; Take 1 capsule (100 mg total) by mouth 4 (four) times daily.  Dispense: 360 capsule; Refill: 1   Partially dictated using Editor, commissioning. Any errors are unintentional.  Halina Maidens, MD [facility]  12/11/2021

## 2021-12-12 LAB — TSH+FREE T4
Free T4: 1.32 ng/dL (ref 0.82–1.77)
TSH: 0.328 u[IU]/mL — ABNORMAL LOW (ref 0.450–4.500)

## 2021-12-12 LAB — CBC WITH DIFFERENTIAL/PLATELET
Basophils Absolute: 0.1 10*3/uL (ref 0.0–0.2)
Basos: 1 %
EOS (ABSOLUTE): 0 10*3/uL (ref 0.0–0.4)
Eos: 0 %
Hematocrit: 41 % (ref 34.0–46.6)
Hemoglobin: 13.6 g/dL (ref 11.1–15.9)
Immature Grans (Abs): 0 10*3/uL (ref 0.0–0.1)
Immature Granulocytes: 0 %
Lymphocytes Absolute: 2.3 10*3/uL (ref 0.7–3.1)
Lymphs: 24 %
MCH: 32.9 pg (ref 26.6–33.0)
MCHC: 33.2 g/dL (ref 31.5–35.7)
MCV: 99 fL — ABNORMAL HIGH (ref 79–97)
Monocytes Absolute: 0.6 10*3/uL (ref 0.1–0.9)
Monocytes: 6 %
Neutrophils Absolute: 6.8 10*3/uL (ref 1.4–7.0)
Neutrophils: 69 %
Platelets: 220 10*3/uL (ref 150–450)
RBC: 4.14 x10E6/uL (ref 3.77–5.28)
RDW: 11.2 % — ABNORMAL LOW (ref 11.7–15.4)
WBC: 9.9 10*3/uL (ref 3.4–10.8)

## 2021-12-12 LAB — LIPID PANEL
Chol/HDL Ratio: 2.2 ratio (ref 0.0–4.4)
Cholesterol, Total: 199 mg/dL (ref 100–199)
HDL: 91 mg/dL (ref >39)
LDL Chol Calc (NIH): 95 mg/dL (ref 0–99)
Triglycerides: 73 mg/dL (ref 0–149)
VLDL Cholesterol Cal: 13 mg/dL (ref 5–40)

## 2021-12-12 LAB — COMPREHENSIVE METABOLIC PANEL
ALT: 10 IU/L (ref 0–32)
AST: 24 IU/L (ref 0–40)
Albumin/Globulin Ratio: 1.9 (ref 1.2–2.2)
Albumin: 4.4 g/dL (ref 3.7–4.7)
Alkaline Phosphatase: 59 IU/L (ref 44–121)
BUN/Creatinine Ratio: 16 (ref 12–28)
BUN: 11 mg/dL (ref 8–27)
Bilirubin Total: 0.5 mg/dL (ref 0.0–1.2)
CO2: 22 mmol/L (ref 20–29)
Calcium: 9.1 mg/dL (ref 8.7–10.3)
Chloride: 105 mmol/L (ref 96–106)
Creatinine, Ser: 0.68 mg/dL (ref 0.57–1.00)
Globulin, Total: 2.3 g/dL (ref 1.5–4.5)
Glucose: 102 mg/dL — ABNORMAL HIGH (ref 70–99)
Potassium: 4.3 mmol/L (ref 3.5–5.2)
Sodium: 141 mmol/L (ref 134–144)
Total Protein: 6.7 g/dL (ref 6.0–8.5)
eGFR: 91 mL/min/{1.73_m2} (ref >59)

## 2021-12-26 ENCOUNTER — Other Ambulatory Visit: Payer: Self-pay | Admitting: Internal Medicine

## 2021-12-26 DIAGNOSIS — J4520 Mild intermittent asthma, uncomplicated: Secondary | ICD-10-CM

## 2021-12-27 NOTE — Telephone Encounter (Signed)
Requested Prescriptions  Pending Prescriptions Disp Refills  . VENTOLIN HFA 108 (90 Base) MCG/ACT inhaler [Pharmacy Med Name: VENTOLIN HFA 108 (90 BASE) MCG/ACT] 18 g 2    Sig: INHALE 2 PUFFS BY MOUTH EVERY 6 HOURS ASNEEDED WHEEZING/ SHORTNESS OF BREATH     Pulmonology:  Beta Agonists 2 Passed - 12/26/2021 10:08 AM      Passed - Last BP in normal range    BP Readings from Last 1 Encounters:  12/11/21 108/62         Passed - Last Heart Rate in normal range    Pulse Readings from Last 1 Encounters:  12/11/21 78         Passed - Valid encounter within last 12 months    Recent Outpatient Visits          2 weeks ago Mild intermittent asthma, unspecified whether complicated   [facility] Glean Hess, MD   2 months ago Mild intermittent asthma, unspecified whether complicated   [facility] Glean Hess, MD   5 months ago Abdominal wall pain   [facility] Glean Hess, MD   6 months ago Mild intermittent asthma, unspecified whether complicated   [facility] Glean Hess, MD   1 year ago Hypothyroidism due to acquired atrophy of thyroid   [facility] Glean Hess, MD      Future Appointments            In 2 weeks [name], MD [place]   In 11 months Army Melia, Jesse Sans, MD [facility], [facility]

## 2022-01-07 ENCOUNTER — Other Ambulatory Visit: Payer: Self-pay | Admitting: Internal Medicine

## 2022-01-07 DIAGNOSIS — E034 Atrophy of thyroid (acquired): Secondary | ICD-10-CM

## 2022-01-08 NOTE — Telephone Encounter (Signed)
Requested Prescriptions  Pending Prescriptions Disp Refills  . levothyroxine (SYNTHROID) 75 MCG tablet [Pharmacy Med Name: LEVOTHYROXINE SODIUM 75 MCG TAB] 60 tablet 0    Sig: TAKE 1 TABLET BY MOUTH ONCE DAILY ON AN EMPTY STOMACH. WAIT 30 MINUTES BEFORE TAKING OTHER MEDS.     Endocrinology:  Hypothyroid Agents Failed - 01/07/2022  8:48 AM      Failed - TSH in normal range and within 360 days    TSH  Date Value Ref Range Status  12/11/2021 0.328 (L) 0.450 - 4.500 uIU/mL Final         Passed - Valid encounter within last 12 months    Recent Outpatient Visits          4 weeks ago Mild intermittent asthma, unspecified whether complicated   [facility] Glean Hess, MD   2 months ago Mild intermittent asthma, unspecified whether complicated   [facility] Glean Hess, MD   6 months ago Abdominal wall pain   [facility] Glean Hess, MD   7 months ago Mild intermittent asthma, unspecified whether complicated   [facility] Glean Hess, MD   1 year ago Hypothyroidism due to acquired atrophy of thyroid   [place], Laura H, MD      Future Appointments            In 6 days [name], MD [place]   In 11 months Army Melia, Jesse Sans, MD [facility], [facility]

## 2022-01-14 ENCOUNTER — Ambulatory Visit (INDEPENDENT_AMBULATORY_CARE_PROVIDER_SITE_OTHER): Payer: Medicare Other | Admitting: Gastroenterology

## 2022-01-14 ENCOUNTER — Encounter: Payer: Self-pay | Admitting: Gastroenterology

## 2022-01-14 VITALS — BP 132/82 | HR 68 | Temp 98.1°F | Wt 117.0 lb

## 2022-01-14 DIAGNOSIS — R1013 Epigastric pain: Secondary | ICD-10-CM | POA: Diagnosis not present

## 2022-01-14 DIAGNOSIS — Z86010 Personal history of colonic polyps: Secondary | ICD-10-CM | POA: Diagnosis not present

## 2022-01-14 MED ORDER — NA SULFATE-K SULFATE-MG SULF 17.5-3.13-1.6 GM/177ML PO SOLN: 1.0000 | Freq: Once | ORAL | 0 refills | Status: AC

## 2022-01-14 NOTE — Progress Notes (Signed)
Primary Care Physician: Glean Hess, MD  Primary Gastroenterologist:  Dr. [name]  Chief Complaint  Patient presents with   Follow-up    HPI: Lindsey Golden is a 76 y.o. female here with a history of dyspepsia.  The patient states that she has long-standing indigestion.  She also has a history of colon polyps.  The patient denies any unexplained weight loss fevers chills nausea vomiting black stools or bloody stools.  The patient is due for a colonoscopy and is requesting to also be set up for an upper endoscopy due to her dyspepsia.   Past Medical History:  Diagnosis Date   Allergy    Asthma    mild intermittent due to allergies.   Osteoarthritis    Thyroid disease     Current Outpatient Medications  Medication Sig Dispense Refill   CALCIUM-VITAMIN D PO Take by mouth daily.     famotidine (PEPCID) 10 MG tablet Take 10 mg by mouth daily.     ferrous sulfate 325 (65 FE) MG tablet Take 325 mg by mouth daily with breakfast.     Fluticasone-Umeclidin-Vilant (TRELEGY ELLIPTA) 100-62.5-25 MCG/ACT AEPB Inhale 1 puff into the lungs daily. 1 each 11   furosemide (LASIX) 20 MG tablet Take 1 tablet (20 mg total) by mouth daily as needed. 30 tablet 0   gabapentin (NEURONTIN) 100 MG capsule Take 1 capsule (100 mg total) by mouth 4 (four) times daily. 360 capsule 1   levothyroxine (SYNTHROID) 75 MCG tablet TAKE 1 TABLET BY MOUTH ONCE DAILY ON AN EMPTY STOMACH. WAIT 30 MINUTES BEFORE TAKING OTHER MEDS. 90 tablet 3   Multiple Vitamins-Minerals (MULTIPLE VITAMINS/WOMENS PO) Take by mouth.     VENTOLIN HFA 108 (90 Base) MCG/ACT inhaler INHALE 2 PUFFS BY MOUTH EVERY 6 HOURS ASNEEDED WHEEZING/ SHORTNESS OF BREATH 18 g 2   Zoledronic Acid (RECLAST IV) Inject into the vein.     No current facility-administered medications for this visit.    Allergies as of 01/14/2022 - Review Complete 01/14/2022  Allergen Reaction Noted   Aspirin Hives 11/28/2014   Penicillins Other (See  Comments) 11/28/2014   Dairy aid [tilactase] Diarrhea 04/02/2017    ROS:  General: Negative for anorexia, weight loss, fever, chills, fatigue, weakness. ENT: Negative for hoarseness, difficulty swallowing , nasal congestion. CV: Negative for chest pain, angina, palpitations, dyspnea on exertion, peripheral edema.  Respiratory: Negative for dyspnea at rest, dyspnea on exertion, cough, sputum, wheezing.  GI: See history of present illness. GU:  Negative for dysuria, hematuria, urinary incontinence, urinary frequency, nocturnal urination.  Endo: Negative for unusual weight change.    Physical Examination:   BP 132/82   Pulse 68   Temp 98.1 F (36.7 C) (Oral)   Wt 117 lb (53.1 kg)   BMI 20.08 kg/m   General: Well-nourished, well-developed in no acute distress.  Eyes: No icterus. Conjunctivae pink. Lungs: Clear to auscultation bilaterally. Non-labored. Heart: Regular rate and rhythm, no murmurs rubs or gallops.  Abdomen: Bowel sounds are normal, nontender, nondistended, no hepatosplenomegaly or masses, no abdominal bruits or hernia , no rebound or guarding.   Extremities: No lower extremity edema. No clubbing or deformities. Neuro: Alert and oriented x 3.  Grossly intact. Skin: Warm and dry, no jaundice.   Psych: Alert and cooperative, normal mood and affect.  Labs:    Imaging Studies: No results found.  Assessment and Plan:   Lindsey Golden is a 76 y.o. y/o female Who comes in today with  a history of colon polyps and is due for a colonoscopy.  The patient also has dyspepsia and will have a upper endoscopy at the same time.  The patient has been exposed the plan and agrees with it.     [name], MD. Marval Regal    Note: This dictation was prepared with Dragon dictation along with smaller phrase technology. Any transcriptional errors that result from this process are unintentional.

## 2022-02-17 ENCOUNTER — Encounter: Payer: Self-pay | Admitting: Gastroenterology

## 2022-02-20 ENCOUNTER — Ambulatory Visit (AMBULATORY_SURGERY_CENTER): Payer: Medicare Other | Admitting: General Practice

## 2022-02-20 ENCOUNTER — Encounter
Admission: RE | Disposition: A | Discharge status: Home / Self Care | Payer: Self-pay | Source: Home / Self Care | Attending: Gastroenterology

## 2022-02-20 ENCOUNTER — Other Ambulatory Visit: Payer: Self-pay

## 2022-02-20 ENCOUNTER — Ambulatory Visit: Payer: Medicare Other | Admitting: General Practice

## 2022-02-20 ENCOUNTER — Ambulatory Visit
Admission: RE | Admit: 2022-02-20 | Discharge: 2022-02-20 | Disposition: A | Discharge status: Home / Self Care | Payer: Medicare Other | Attending: Gastroenterology | Admitting: Gastroenterology

## 2022-02-20 ENCOUNTER — Encounter: Payer: Self-pay | Admitting: Gastroenterology

## 2022-02-20 DIAGNOSIS — K5730 Diverticulosis of large intestine without perforation or abscess without bleeding: Secondary | ICD-10-CM | POA: Diagnosis not present

## 2022-02-20 DIAGNOSIS — R1310 Dysphagia, unspecified: Secondary | ICD-10-CM | POA: Diagnosis not present

## 2022-02-20 DIAGNOSIS — R1013 Epigastric pain: Secondary | ICD-10-CM | POA: Diagnosis not present

## 2022-02-20 DIAGNOSIS — Z86010 Personal history of colonic polyps: Secondary | ICD-10-CM

## 2022-02-20 DIAGNOSIS — Z09 Encounter for follow-up examination after completed treatment for conditions other than malignant neoplasm: Secondary | ICD-10-CM | POA: Diagnosis not present

## 2022-02-20 DIAGNOSIS — K640 First degree hemorrhoids: Secondary | ICD-10-CM | POA: Insufficient documentation

## 2022-02-20 DIAGNOSIS — K219 Gastro-esophageal reflux disease without esophagitis: Secondary | ICD-10-CM | POA: Diagnosis not present

## 2022-02-20 DIAGNOSIS — F17210 Nicotine dependence, cigarettes, uncomplicated: Secondary | ICD-10-CM | POA: Diagnosis not present

## 2022-02-20 DIAGNOSIS — K2970 Gastritis, unspecified, without bleeding: Secondary | ICD-10-CM | POA: Diagnosis not present

## 2022-02-20 DIAGNOSIS — E039 Hypothyroidism, unspecified: Secondary | ICD-10-CM | POA: Diagnosis not present

## 2022-02-20 DIAGNOSIS — K2950 Unspecified chronic gastritis without bleeding: Secondary | ICD-10-CM | POA: Insufficient documentation

## 2022-02-20 HISTORY — PX: ESOPHAGOGASTRODUODENOSCOPY: SHX5428

## 2022-02-20 HISTORY — PX: COLONOSCOPY: SHX5424

## 2022-02-20 SURGERY — COLONOSCOPY
Anesthesia: General | Site: Rectum

## 2022-02-20 MED ORDER — LACTATED RINGERS IV SOLN: INTRAVENOUS | Status: DC

## 2022-02-20 MED ORDER — GLYCOPYRROLATE PF 0.2 MG/ML IJ SOSY
PREFILLED_SYRINGE | INTRAMUSCULAR | Status: DC | PRN
  Administered 2022-02-20: .1 mg via INTRAVENOUS

## 2022-02-20 MED ORDER — LIDOCAINE HCL (CARDIAC) PF 100 MG/5ML IV SOSY
PREFILLED_SYRINGE | INTRAVENOUS | Status: DC | PRN
  Administered 2022-02-20: 50 mg via INTRAVENOUS

## 2022-02-20 MED ORDER — STERILE WATER FOR IRRIGATION IR SOLN
Status: DC | PRN
  Administered 2022-02-20: 25 mL

## 2022-02-20 MED ORDER — PHENYLEPHRINE HCL (PRESSORS) 10 MG/ML IV SOLN
INTRAVENOUS | Status: DC | PRN
  Administered 2022-02-20: 100 ug via INTRAVENOUS

## 2022-02-20 MED ORDER — SODIUM CHLORIDE 0.9 % IV SOLN: INTRAVENOUS | Status: DC

## 2022-02-20 MED ORDER — PROPOFOL 10 MG/ML IV BOLUS
INTRAVENOUS | Status: DC | PRN
  Administered 2022-02-20 (×2): 50 mg via INTRAVENOUS
  Administered 2022-02-20 (×2): 25 mg via INTRAVENOUS
  Administered 2022-02-20 (×3): 50 mg via INTRAVENOUS

## 2022-02-20 SURGICAL SUPPLY — 8 items
BLOCK BITE 60FR ADLT L/F GRN (MISCELLANEOUS) ×2 IMPLANT
FORCEPS BIOP RAD 4 LRG CAP 4 (CUTTING FORCEPS) IMPLANT
GOWN CVR UNV OPN BCK APRN NK (MISCELLANEOUS) ×8 IMPLANT
GOWN ISOL THUMB LOOP REG UNIV (MISCELLANEOUS) ×8
KIT PRC NS LF DISP ENDO (KITS) ×4 IMPLANT
KIT PROCEDURE OLYMPUS (KITS) ×4
MANIFOLD NEPTUNE II (INSTRUMENTS) ×4 IMPLANT
WATER STERILE IRR 250ML POUR (IV SOLUTION) ×4 IMPLANT

## 2022-02-20 NOTE — Anesthesia Postprocedure Evaluation (Signed)
Anesthesia Post Note  Patient: Lindsey Golden  Procedure(s) Performed: COLONOSCOPY (Rectum) ESOPHAGOGASTRODUODENOSCOPY (EGD) WITH BIOPSY (Mouth)     Patient location during evaluation: PACU Anesthesia Type: General Level of consciousness: awake and alert Pain management: pain level controlled Vital Signs Assessment: post-procedure vital signs reviewed and stable Respiratory status: spontaneous breathing, nonlabored ventilation, respiratory function stable and patient connected to nasal cannula oxygen Cardiovascular status: blood pressure returned to baseline and stable Postop Assessment: no apparent nausea or vomiting Anesthetic complications: no   There were no known notable events for this encounter.  Martha Clan

## 2022-02-20 NOTE — Op Note (Signed)
[facility] Gastroenterology Patient Name: Lindsey Golden Procedure Date: 02/20/2022 8:59 AM MRN: [id number] Account #: [id number] Date of Birth: 11/07/1945 Admit Type: Outpatient Age: 76 Room: [facility] OR ROOM 1 Gender: Female Note Status: Finalized Instrument Name: [id number] Procedure:             Upper GI endoscopy Indications:           Dyspepsia Providers:             [name] MD, MD Referring MD:          Halina Maidens, MD (Referring MD) Medicines:             Propofol per Anesthesia Complications:         No immediate complications. Procedure:             Pre-Anesthesia Assessment:                        - Prior to the procedure, a History and Physical was                         performed, and patient medications and allergies were                         reviewed. The patient's tolerance of previous                         anesthesia was also reviewed. The risks and benefits                         of the procedure and the sedation options and risks                         were discussed with the patient. All questions were                         answered, and informed consent was obtained. Prior                         Anticoagulants: The patient has taken no previous                         anticoagulant or antiplatelet agents. ASA Grade                         Assessment: II - A patient with mild systemic disease.                         After reviewing the risks and benefits, the patient                         was deemed in satisfactory condition to undergo the                         procedure.                        After obtaining informed consent, the endoscope was  passed under direct vision. Throughout the procedure,                         the patient's blood pressure, pulse, and oxygen                         saturations were monitored continuously. The Endoscope                         was introduced through the  mouth, and advanced to the                         second part of duodenum. The upper GI endoscopy was                         accomplished without difficulty. The patient tolerated                         the procedure well. Findings:      The Z-line was irregular and was found at the gastroesophageal junction.      Localized mild inflammation characterized by erythema was found in the       gastric antrum. Biopsies were taken with a cold forceps for histology.      The examined duodenum was normal. Impression:            - Z-line irregular, at the gastroesophageal junction.                        - Gastritis. Biopsied.                        - Normal examined duodenum. Recommendation:        - Discharge patient to home.                        - Resume previous diet.                        - Continue present medications.                        - Await pathology results.                        - Perform a colonoscopy today. Procedure Code(s):     --- Professional ---                        [phone], Esophagogastroduodenoscopy, flexible,                         transoral; with biopsy, single or multiple Diagnosis Code(s):     --- Professional ---                        R10.13, Epigastric pain                        K29.70, Gastritis, unspecified, without bleeding CPT copyright 2019 American Medical Association. All rights reserved. The codes documented in this report are preliminary and upon coder review may  be revised to meet  current compliance requirements. [name] MD, MD 02/20/2022 9:11:59 AM This report has been signed electronically. Number of Addenda: 0 Note Initiated On: 02/20/2022 8:59 AM Total Procedure Duration: 0 hours 2 minutes 45 seconds  Estimated Blood Loss:  Estimated blood loss: none.      [facility]

## 2022-02-20 NOTE — Transfer of Care (Signed)
Immediate Anesthesia Transfer of Care Note  Patient: Lindsey Golden  Procedure(s) Performed: COLONOSCOPY (Rectum) ESOPHAGOGASTRODUODENOSCOPY (EGD) WITH BIOPSY (Mouth)  Patient Location: PACU  Anesthesia Type: General  Level of Consciousness: awake, alert  and patient cooperative  Airway and Oxygen Therapy: Patient Spontanous Breathing and Patient connected to supplemental oxygen  Post-op Assessment: Post-op Vital signs reviewed, Patient's Cardiovascular Status Stable, Respiratory Function Stable, Patent Airway and No signs of Nausea or vomiting  Post-op Vital Signs: Reviewed and stable  Complications: There were no known notable events for this encounter.

## 2022-02-20 NOTE — H&P (Signed)
Lindsey Lame, MD [facility] [address] [place], [place] [address] Phone:[phone] Fax : [phone]  Primary Care Physician:  Glean Hess, MD Primary Gastroenterologist:  Dr. Allen Norris  Pre-Procedure History & Physical: HPI:  Lindsey Golden is a 76 y.o. female is here for an endoscopy and colonoscopy.   Past Medical History:  Diagnosis Date   Allergy    Asthma    mild intermittent due to allergies.   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 Vira Agar   KNEE SURGERY     five different surgery    Prior to Admission medications   Medication Sig Start Date End Date Taking? Authorizing Provider  CALCIUM-VITAMIN D PO Take by mouth daily.   Yes [provider]  famotidine (PEPCID) 10 MG tablet Take 10 mg by mouth daily.   Yes [provider]  ferrous sulfate 325 (65 FE) MG tablet Take 325 mg by mouth daily with breakfast.   Yes [provider]  Fluticasone-Umeclidin-Vilant (TRELEGY ELLIPTA) 100-62.5-25 MCG/ACT AEPB Inhale 1 puff into the lungs daily. 10/16/21  Yes Glean Hess, MD  gabapentin (NEURONTIN) 100 MG capsule Take 1 capsule (100 mg total) by mouth 4 (four) times daily. Patient taking differently: Take 100 mg by mouth 4 (four) times daily. Takes 200 mg BID 12/11/21  Yes Glean Hess, MD  levothyroxine (SYNTHROID) 75 MCG tablet TAKE 1 TABLET BY MOUTH ONCE DAILY ON AN EMPTY STOMACH. WAIT 30 MINUTES BEFORE TAKING OTHER MEDS. 01/08/22  Yes Glean Hess, MD  Multiple Vitamins-Minerals (MULTIPLE VITAMINS/WOMENS PO) Take by mouth.   Yes [provider]  VENTOLIN HFA 108 (90 Base) MCG/ACT inhaler INHALE 2 PUFFS BY MOUTH EVERY 6 HOURS ASNEEDED WHEEZING/ SHORTNESS OF BREATH 12/27/21  Yes Glean Hess, MD  Zoledronic Acid (RECLAST IV) Inject into the vein. April   Yes [provider]  furosemide (LASIX) 20 MG tablet Take 1 tablet (20 mg total) by mouth daily as needed. Patient not taking: Reported on 02/17/2022 12/17/20   Glean Hess, MD    Allergies as of 01/14/2022 - Review Complete 01/14/2022  Allergen Reaction Noted   Aspirin Hives 11/28/2014   Penicillins Other (See Comments) 11/28/2014   Dairy aid [tilactase] Diarrhea 04/02/2017    Family History  Problem Relation Age of Onset   Depression Mother    Hearing loss Mother    Mental illness Mother    Miscarriages / [place] Mother    Asthma Father    Kidney disease Father    Stroke Father    Varicose Veins Father    Breast cancer Neg Hx     Social History   Socioeconomic History   Marital status: Married    Spouse name: Not on file   Number of children: 0   Years of education: Not on file   Highest education level: Some college, no degree  Occupational History   Occupation: retired  Tobacco Use   Smoking status: Every Day    Packs/day: 0.20    Years: 40.00    Total pack years: 8.00    Types: Cigarettes   Smokeless tobacco: Never   Tobacco comments:    4-5 cigarettes daily.  Off and on since age 57  Vaping Use   Vaping Use: Never used  Substance and Sexual Activity   Alcohol use: Yes    Comment: Occasional   Drug use: No   Sexual activity: Not on file  Other Topics Concern   Not on file  Social History Narrative   Not on file   Social Determinants of Health   Financial Resource Strain: Low Risk  (10/16/2021)   Overall Financial Resource Strain (CARDIA)    Difficulty of Paying Living Expenses: Not hard at all  Food Insecurity: No Food Insecurity (10/16/2021)   Hunger Vital Sign    Worried About Running Out of Food in the Last Year: Never true    Ran Out of Food in the Last Year: Never true  Transportation Needs: No Transportation Needs (10/16/2021)   PRAPARE - Armed forces logistics/support/administrative officer (Medical): No    Lack of Transportation (Non-Medical): No  Physical Activity: Insufficiently Active (05/06/2021)   Exercise Vital Sign    Days of Exercise per Week: 7 days    Minutes of Exercise per Session: 10 min  Stress: No Stress Concern Present (10/16/2021)   [place]    Feeling of Stress : Not at all  Social Connections: Moderately Isolated (05/06/2021)   Social Connection and Isolation Panel [NHANES]    Frequency of Communication with Friends and Family: More than three times a week    Frequency of Social Gatherings with Friends and Family: More than three times a week    Attends Religious Services: Never    Marine scientist or Organizations: No    Attends Archivist Meetings: Never    Marital Status: Married  Human resources officer Violence: Not At Risk (10/16/2021)   Humiliation, Afraid, Rape, and Kick questionnaire    Fear of Current or Ex-Partner: No    Emotionally Abused: No    Physically Abused: No    Sexually Abused: No    Review of Systems: See HPI, otherwise negative ROS  Physical Exam: BP 120/70   Pulse 77   Temp 98.8 F (37.1 C) (Temporal)   Ht '5\' 4"'$  (1.626 m)   Wt 52.5 kg   SpO2 98%   BMI 19.88 kg/m  General:   Alert,  pleasant and cooperative in NAD Head:  Normocephalic and atraumatic. Neck:  Supple; no masses or thyromegaly. Lungs:  Clear throughout to auscultation.    Heart:  Regular rate and rhythm. Abdomen:  Soft, nontender and nondistended. Normal bowel sounds, without guarding, and without rebound.   Neurologic:  Alert and  oriented x4;  grossly normal neurologically.  Impression/Plan: [name] is here for an endoscopy and colonoscopy to be performed for dyspepsia and a history of adenomatous polyps on 2019   Risks, benefits, limitations, and alternatives regarding  endoscopy and colonoscopy have been reviewed with the patient.   Questions have been answered.  All parties agreeable.   Lindsey Lame, MD  02/20/2022, 8:26 AM

## 2022-02-20 NOTE — Anesthesia Preprocedure Evaluation (Signed)
Anesthesia Evaluation  Patient identified by MRN, date of birth, ID band Patient awake    Reviewed: Allergy & Precautions, H&P , NPO status , Patient's Chart, lab work & pertinent test results, reviewed documented beta blocker date and time   History of Anesthesia Complications Negative for: history of anesthetic complications  Airway Mallampati: II  TM Distance: >3 FB Neck ROM: full    Dental  (+) Caps, Dental Advidsory Given, Teeth Intact Permanent bridge:   Pulmonary neg shortness of breath, asthma , neg sleep apnea, neg COPD, neg recent URI, Current Smoker,    Pulmonary exam normal breath sounds clear to auscultation       Cardiovascular Exercise Tolerance: Good negative cardio ROS Normal cardiovascular exam Rhythm:regular Rate:Normal     Neuro/Psych negative neurological ROS  negative psych ROS   GI/Hepatic Neg liver ROS, GERD  ,  Endo/Other  neg diabetesHypothyroidism   Renal/GU negative Renal ROS  negative genitourinary   Musculoskeletal   Abdominal   Peds  Hematology negative hematology ROS (+)   Anesthesia Other Findings Past Medical History: No date: Allergy No date: Asthma     Comment:  mild intermittent due to allergies. No date: Osteoarthritis No date: Thyroid disease   Reproductive/Obstetrics negative OB ROS                             Anesthesia Physical Anesthesia Plan  ASA: 2  Anesthesia Plan: General   Post-op Pain Management:    Induction: Intravenous  PONV Risk Score and Plan: 2 and Propofol infusion and TIVA  Airway Management Planned: Natural Airway and Nasal Cannula  Additional Equipment:   Intra-op Plan:   Post-operative Plan:   Informed Consent: I have reviewed the patients History and Physical, chart, labs and discussed the procedure including the risks, benefits and alternatives for the proposed anesthesia with the patient or authorized  representative who has indicated his/her understanding and acceptance.     Dental Advisory Given  Plan Discussed with: Anesthesiologist, CRNA and Surgeon  Anesthesia Plan Comments:         Anesthesia Quick Evaluation

## 2022-02-20 NOTE — Op Note (Signed)
[facility] Gastroenterology Patient Name: Lindsey Golden Procedure Date: 02/20/2022 8:58 AM MRN: [id number] Account #: [id number] Date of Birth: Aug 26, 1945 Admit Type: Outpatient Age: 76 Room: [facility] OR ROOM 01 Gender: Female Note Status: Finalized Instrument Name: [id number] Procedure:             Colonoscopy Indications:           High risk colon cancer surveillance: Personal history                         of colonic polyps Providers:             [name] MD, MD Referring MD:          Halina Maidens, MD (Referring MD) Medicines:             Propofol per Anesthesia Complications:         No immediate complications. Procedure:             Pre-Anesthesia Assessment:                        - Prior to the procedure, a History and Physical was                         performed, and patient medications and allergies were                         reviewed. The patient's tolerance of previous                         anesthesia was also reviewed. The risks and benefits                         of the procedure and the sedation options and risks                         were discussed with the patient. All questions were                         answered, and informed consent was obtained. Prior                         Anticoagulants: The patient has taken no previous                         anticoagulant or antiplatelet agents. ASA Grade                         Assessment: II - A patient with mild systemic disease.                         After reviewing the risks and benefits, the patient                         was deemed in satisfactory condition to undergo the                         procedure.  After obtaining informed consent, the colonoscope was                         passed under direct vision. Throughout the procedure,                         the patient's blood pressure, pulse, and oxygen                         saturations were monitored  continuously. The                         Colonoscope was introduced through the anus and                         advanced to the the cecum, identified by appendiceal                         orifice and ileocecal valve. The colonoscopy was                         performed with ease. Findings:      The perianal and digital rectal examinations were normal.      A few small-mouthed diverticula were found in the sigmoid colon.      Non-bleeding internal hemorrhoids were found during retroflexion. The       hemorrhoids were Grade I (internal hemorrhoids that do not prolapse). Impression:            - Diverticulosis in the sigmoid colon.                        - Non-bleeding internal hemorrhoids.                        - No specimens collected. Recommendation:        - Discharge patient to home.                        - Resume previous diet.                        - Continue present medications.                        - Repeat colonoscopy is not recommended for                         surveillance. Procedure Code(s):     --- Professional ---                        [phone], Colonoscopy, flexible; diagnostic, including                         collection of specimen(s) by brushing or washing, when                         performed (separate procedure) Diagnosis Code(s):     --- Professional ---  Z86.010, Personal history of colonic polyps CPT copyright 2019 American Medical Association. All rights reserved. The codes documented in this report are preliminary and upon coder review may  be revised to meet current compliance requirements. [name] MD, MD 02/20/2022 9:27:21 AM This report has been signed electronically. Number of Addenda: 0 Note Initiated On: 02/20/2022 8:58 AM Scope Withdrawal Time: 0 hours 8 minutes 48 seconds  Total Procedure Duration: 0 hours 12 minutes 24 seconds  Estimated Blood Loss:  Estimated blood loss: none.      [facility]

## 2022-02-21 ENCOUNTER — Encounter: Payer: Self-pay | Admitting: Gastroenterology

## 2022-02-25 LAB — SURGICAL PATHOLOGY

## 2022-02-28 ENCOUNTER — Encounter: Payer: Self-pay | Admitting: Gastroenterology

## 2022-03-10 ENCOUNTER — Ambulatory Visit (INDEPENDENT_AMBULATORY_CARE_PROVIDER_SITE_OTHER): Payer: Medicare Other | Admitting: Gastroenterology

## 2022-03-10 ENCOUNTER — Ambulatory Visit: Payer: Medicare Other | Admitting: Gastroenterology

## 2022-03-10 ENCOUNTER — Encounter: Payer: Self-pay | Admitting: Gastroenterology

## 2022-03-10 VITALS — BP 133/83 | HR 76 | Temp 98.1°F | Wt 117.8 lb

## 2022-03-10 DIAGNOSIS — K31A0 Gastric intestinal metaplasia, unspecified: Secondary | ICD-10-CM

## 2022-03-10 NOTE — Progress Notes (Signed)
Primary Care Physician: Glean Hess, MD  Primary Gastroenterologist:  Dr. [name]  Chief Complaint  Patient presents with   Follow-up    HPI: Lindsey Golden is a 76 y.o. female here for follow-up after having an upper endoscopy and colonoscopy.  The patient's colonoscopy was normal and a repeat colonoscopy was not recommended.  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 pylori and the patient is now coming here today to follow-up with the results.  The patient reports that she has been doing well without any complaints except that she does report that she has a nervous stomach and whenever she is under a lot of stress will have diarrhea.  Past Medical History:  Diagnosis Date   Allergy    Asthma    mild intermittent due to allergies.   Osteoarthritis    Thyroid disease     Current Outpatient Medications  Medication Sig Dispense Refill   CALCIUM-VITAMIN D PO Take by mouth daily.     famotidine (PEPCID) 10 MG tablet Take 10 mg by mouth daily.     ferrous sulfate 325 (65 FE) MG tablet Take 325 mg by mouth daily with breakfast.     Fluticasone-Umeclidin-Vilant (TRELEGY ELLIPTA) 100-62.5-25 MCG/ACT AEPB Inhale 1 puff into the lungs daily. 1 each 11   gabapentin (NEURONTIN) 100 MG capsule Take 1 capsule (100 mg total) by mouth 4 (four) times daily. (Patient taking differently: Take 100 mg by mouth 4 (four) times daily. Takes 200 mg BID) 360 capsule 1   levothyroxine (SYNTHROID) 75 MCG tablet TAKE 1 TABLET BY MOUTH ONCE DAILY ON AN EMPTY STOMACH. WAIT 30 MINUTES BEFORE TAKING OTHER MEDS. 90 tablet 3   Multiple Vitamins-Minerals (MULTIPLE VITAMINS/WOMENS PO) Take by mouth.     VENTOLIN HFA 108 (90 Base) MCG/ACT inhaler INHALE 2 PUFFS BY MOUTH EVERY 6 HOURS ASNEEDED WHEEZING/ SHORTNESS OF BREATH 18 g 2   Zoledronic Acid (RECLAST IV) Inject  into the vein. April     No current facility-administered medications for this visit.    Allergies as of 03/10/2022 - Review Complete 03/10/2022  Allergen Reaction Noted   Aspirin Hives 11/28/2014   Penicillins Other (See Comments) 11/28/2014   Dairy aid [tilactase] Diarrhea 04/02/2017    ROS:  General: Negative for anorexia, weight loss, fever, chills, fatigue, weakness. ENT: Negative for hoarseness, difficulty swallowing , nasal congestion. CV: Negative for chest pain, angina, palpitations, dyspnea on exertion, peripheral edema.  Respiratory: Negative for dyspnea at rest, dyspnea on exertion, cough, sputum, wheezing.  GI: See history of present illness. GU:  Negative for dysuria, hematuria, urinary incontinence, urinary frequency, nocturnal urination.  Endo: Negative for unusual weight change.    Physical Examination:   BP 133/83   Pulse 76   Temp 98.1 F (36.7 C) (Oral)   Wt 117 lb 12.8 oz (53.4 kg)   BMI 20.22 kg/m   General: Well-nourished, well-developed in no acute distress.  Eyes: No icterus. Conjunctivae pink. Neuro: Alert and oriented x 3.  Grossly intact. Skin: Warm and dry, no jaundice.   Psych: Alert and cooperative, normal mood and affect.  Labs:    Imaging Studies: No results found.  Assessment and Plan:   Lindsey Golden is a 76 y.o. y/o female who comes in today for follow-up after having upper endoscopy with gastric intestinal metaplasia.  The patient was told that she needs a repeat  upper endoscopy in 1 year for gastric mapping.  The patient has also been told the risks of gastric cancer with gastric intestinal metaplasia and that it is a low risk but we should keep an eye on it.  The patient has been explained the plan and agrees with it.  She will be set up for repeat EGD in 1 year.     [name], MD. Marval Regal    Note: This dictation was prepared with Dragon dictation along with smaller phrase technology. Any transcriptional errors that  result from this process are unintentional.

## 2022-03-19 ENCOUNTER — Other Ambulatory Visit: Payer: Self-pay | Admitting: Internal Medicine

## 2022-03-19 DIAGNOSIS — Z23 Encounter for immunization: Secondary | ICD-10-CM | POA: Diagnosis not present

## 2022-03-19 DIAGNOSIS — R202 Paresthesia of skin: Secondary | ICD-10-CM

## 2022-03-19 NOTE — Telephone Encounter (Signed)
Requested Prescriptions  Pending Prescriptions Disp Refills  . gabapentin (NEURONTIN) 100 MG capsule [Pharmacy Med Name: GABAPENTIN 100 MG CAP] 360 capsule 1    Sig: TAKE 1 CAPSULE BY MOUTH 4 TIMES DAILY     Neurology: Anticonvulsants - gabapentin Passed - 03/19/2022  9:19 AM      Passed - Cr in normal range and within 360 days    Creat  Date Value Ref Range Status  09/02/2016 0.65 0.50 - 0.99 mg/dL Final    Comment:      For patients > or = 74 years of age: The upper reference limit for Creatinine is approximately 13% higher for people identified as African-American.      Creatinine, Ser  Date Value Ref Range Status  12/11/2021 0.68 0.57 - 1.00 mg/dL Final         Passed - Completed PHQ-2 or PHQ-9 in the last 360 days      Passed - Valid encounter within last 12 months    Recent Outpatient Visits          3 months ago Mild intermittent asthma, unspecified whether complicated   [place] [facility] at [facility], Jesse Sans, MD   5 months ago Mild intermittent asthma, unspecified whether complicated   [place] [facility] at [facility], Jesse Sans, MD   8 months ago Abdominal wall pain   [place] [facility] at [facility], Jesse Sans, MD   9 months ago Mild intermittent asthma, unspecified whether complicated   [place] [facility] at [facility], Jesse Sans, MD   1 year ago Hypothyroidism due to acquired atrophy of thyroid   [facility] at [facility], Jesse Sans, MD      Future Appointments            In 9 months Army Melia, Jesse Sans, MD [place] and Sports Medicine at [facility]

## 2022-04-21 ENCOUNTER — Other Ambulatory Visit: Payer: Self-pay | Admitting: Internal Medicine

## 2022-04-21 DIAGNOSIS — J4520 Mild intermittent asthma, uncomplicated: Secondary | ICD-10-CM

## 2022-04-21 DIAGNOSIS — Z23 Encounter for immunization: Secondary | ICD-10-CM | POA: Diagnosis not present

## 2022-04-22 NOTE — Telephone Encounter (Signed)
Requested Prescriptions  Pending Prescriptions Disp Refills   VENTOLIN HFA 108 (90 Base) MCG/ACT inhaler [Pharmacy Med Name: VENTOLIN HFA 108 (90 BASE) MCG/ACT] 18 g 2    Sig: INHALE 2 PUFFS BY MOUTH EVERY 6 HOURS ASNEEDED WHEEZING/ SHORTNESS OF BREATH     Pulmonology:  Beta Agonists 2 Passed - 04/21/2022  4:42 PM      Passed - Last BP in normal range    BP Readings from Last 1 Encounters:  03/10/22 133/83         Passed - Last Heart Rate in normal range    Pulse Readings from Last 1 Encounters:  03/10/22 76         Passed - Valid encounter within last 12 months    Recent Outpatient Visits           4 months ago Mild intermittent asthma, unspecified whether complicated   [place] [facility] at [facility], Jesse Sans, MD   6 months ago Mild intermittent asthma, unspecified whether complicated   [place] [facility] at [facility], Jesse Sans, MD   9 months ago Abdominal wall pain   [place] [facility] at [facility], Jesse Sans, MD   10 months ago Mild intermittent asthma, unspecified whether complicated   [place] [facility] at [facility], Jesse Sans, MD   1 year ago Hypothyroidism due to acquired atrophy of thyroid   [facility] at [facility], Jesse Sans, MD       Future Appointments             In 7 months Army Melia, Jesse Sans, MD [place] and Sports Medicine at [facility]

## 2022-05-07 ENCOUNTER — Ambulatory Visit (INDEPENDENT_AMBULATORY_CARE_PROVIDER_SITE_OTHER): Payer: Medicare Other

## 2022-05-07 VITALS — Ht 64.0 in | Wt 117.0 lb

## 2022-05-07 DIAGNOSIS — Z0000 Encounter for general adult medical examination without abnormal findings: Secondary | ICD-10-CM

## 2022-05-07 NOTE — Progress Notes (Signed)
Subjective:   Lindsey Golden is a 76 y.o. female who presents for Medicare Annual (Subsequent) preventive examination.  I connected with  Lindsey Golden on 05/07/22 by a audio enabled telemedicine application and verified that I am speaking with the correct person using two identifiers.  Patient Location: Home  Provider Location: Office/Clinic  I discussed the limitations of evaluation and management by telemedicine. The patient expressed understanding and agreed to proceed.   Review of Systems    Defer to PCP Cardiac Risk Factors include: advanced age (>48mn, >>24women)     Objective:    Today's Vitals   05/07/22 0928  Weight: 117 lb (53.1 kg)  Height: '5\' 4"'$  (1.626 m)   Body mass index is 20.08 kg/m.     05/07/2022    9:50 AM 02/20/2022    8:12 AM 05/06/2021   10:52 AM 02/22/2021    3:09 PM 10/12/2020   10:28 AM 04/25/2020    9:02 AM 04/25/2019    8:52 AM  Advanced Directives  Does Patient Have a Medical Advance Directive? Yes Yes Yes No No Yes No  Type of AParamedicof [place]ose[place]Living will [place]Living will   [place] will   Does patient want to make changes to medical advance directive?  No - Patient declined     No - Patient declined  Copy of [place] Chart? No - copy requested Yes - validated most recent copy scanned in chart (See row information) No - copy requested   No - copy requested     Current Medications (verified) Outpatient Encounter Medications as of 05/07/2022  Medication Sig   CALCIUM-VITAMIN D PO Take by mouth daily.   famotidine (PEPCID) 10 MG tablet Take 10 mg by mouth daily.   ferrous sulfate 325 (65 FE) MG tablet Take 325 mg by mouth daily with breakfast.   Fluticasone-Umeclidin-Vilant (TRELEGY ELLIPTA) 100-62.5-25 MCG/ACT AEPB Inhale 1 puff into the lungs daily.   gabapentin (NEURONTIN) 100 MG  capsule Take 1 capsule (100 mg total) by mouth 4 (four) times daily. (Patient taking differently: Take 100 mg by mouth 4 (four) times daily. Takes 200 mg BID)   levothyroxine (SYNTHROID) 75 MCG tablet TAKE 1 TABLET BY MOUTH ONCE DAILY ON AN EMPTY STOMACH. WAIT 30 MINUTES BEFORE TAKING OTHER MEDS.   Multiple Vitamins-Minerals (MULTIPLE VITAMINS/WOMENS PO) Take by mouth.   VENTOLIN HFA 108 (90 Base) MCG/ACT inhaler INHALE 2 PUFFS BY MOUTH EVERY 6 HOURS ASNEEDED WHEEZING/ SHORTNESS OF BREATH   Zoledronic Acid (RECLAST IV) Inject into the vein. April   No facility-administered encounter medications on file as of 05/07/2022.    Allergies (verified) Aspirin, Penicillins, and Dairy aid [tilactase]   History: Past Medical History:  Diagnosis Date   Allergy    Asthma    mild intermittent due to allergies.   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 EVira Agar  COLONOSCOPY N/A 02/20/2022   Procedure: COLONOSCOPY;  Surgeon: WLucilla Lame MD;  [place]  Service: Endoscopy;  Laterality: N/A;   ESOPHAGOGASTRODUODENOSCOPY N/A 02/20/2022   Procedure: ESOPHAGOGASTRODUODENOSCOPY (EGD) WITH BIOPSY;  Surgeon: WLucilla Lame  MD;  Location: [place];  Service: Endoscopy;  Laterality: N/A;   KNEE SURGERY     five different surgery   Family History  Problem Relation Age of Onset   Depression Mother    Hearing loss Mother    Mental illness Mother    Miscarriages / [place] Mother    Asthma Father    Kidney disease Father    Stroke Father    Varicose Veins Father    Breast cancer Neg Hx    Social History   Socioeconomic History   Marital status: Married    Spouse name: Not on file   Number of children: 0   Years of education: Not on file    Highest education level: Some college, no degree  Occupational History   Occupation: retired  Tobacco Use   Smoking status: Every Day    Packs/day: 0.20    Years: 40.00    Total pack years: 8.00    Types: Cigarettes   Smokeless tobacco: Never   Tobacco comments:    4-5 cigarettes daily.  Off and on since age 24  Vaping Use   Vaping Use: Never used  Substance and Sexual Activity   Alcohol use: Yes    Comment: Occasional   Drug use: No   Sexual activity: Not on file  Other Topics Concern   Not on file  Social History Narrative   Not on file   Social Determinants of Health   Financial Resource Strain: Low Risk  (05/07/2022)   Overall Financial Resource Strain (CARDIA)    Difficulty of Paying Living Expenses: Not hard at all  Food Insecurity: No Food Insecurity (05/07/2022)   Hunger Vital Sign    Worried About Running Out of Food in the Last Year: Never true    Ran Out of Food in the Last Year: Never true  Transportation Needs: No Transportation Needs (05/07/2022)   PRAPARE - Hydrologist (Medical): No    Lack of Transportation (Non-Medical): No  Physical Activity: Sufficiently Active (05/07/2022)   Exercise Vital Sign    Days of Exercise per Week: 7 days    Minutes of Exercise per Session: 30 min  Stress: No Stress Concern Present (05/07/2022)   [place]    Feeling of Stress : Not at all  Social Connections: Moderately Integrated (05/07/2022)   Social Connection and Isolation Panel [NHANES]    Frequency of Communication with Friends and Family: More than three times a week    Frequency of Social Gatherings with Friends and Family: Once a week    Attends Religious Services: More than 4 times per year    Active Member of [employer] or Organizations: No    Attends Archivist Meetings: Never    Marital Status: Married    Tobacco Counseling Ready to quit: Not  Answered Counseling given: Not Answered Tobacco comments: 4-5 cigarettes daily.  Off and on since age 43   Clinical Intake:  Pre-visit preparation completed: Yes  Pain : No/denies pain     BMI - recorded: 20.08 Nutritional Status: BMI of 19-24  Normal Nutritional Risks: None Diabetes: No     Diabetic? No.  Interpreter Needed?: No  Information entered by :: Wyatt Haste, [place]   Activities of Daily Living    05/07/2022    9:51 AM 02/20/2022    8:04 AM  In your present state of health, do you have any difficulty performing  the following activities:  Hearing? 0 0  Vision? 0 0  Difficulty concentrating or making decisions? 0 0  Walking or climbing stairs? 0 0  Dressing or bathing? 0 0  Doing errands, shopping? 0   Preparing Food and eating ? N   Using the Toilet? N   In the past six months, have you accidently leaked urine? N   Do you have problems with loss of bowel control? N   Managing your Medications? N   Managing your Finances? N   Housekeeping or managing your Housekeeping? N     Patient Care Team: Glean Hess, MD as PCP - General (Internal Medicine) Gabriel Carina Betsey Holiday, MD as Physician Assistant (Endocrinology)  Indicate any recent Medical Services you may have received from other than [facility] providers in the past year (date may be approximate).     Assessment:   This is a routine wellness examination for [place].  Hearing/Vision screen Hearing Screening - Comments:: No concerns. Vision Screening - Comments:: Wears glasses to drive.  Dietary issues and exercise activities discussed: Current Exercise Habits: Home exercise routine, Type of exercise: walking;stretching, Time (Minutes): 30, Frequency (Times/Week): >7, Weekly Exercise (Minutes/Week): 0, Intensity: Moderate   Goals Addressed   None   Depression Screen    05/07/2022    9:50 AM 12/11/2021   10:13 AM 10/16/2021    8:24 AM 07/10/2021   10:31 AM 06/11/2021    1:42 PM 05/06/2021   10:51 AM  12/17/2020   10:49 AM  PHQ 2/9 Scores  PHQ - 2 Score 0 0 0 0 0 0 0  PHQ- 9 Score 0 0 0 0 0  0    Fall Risk    05/07/2022    9:51 AM 12/11/2021   10:13 AM 10/16/2021    8:24 AM 07/10/2021   10:32 AM 06/11/2021    1:43 PM  Fall Risk   Falls in the past year? 0 0 0 0 0  Number falls in past yr: 0 0 0 0 0  Injury with Fall? 0 0 0 0 0  Risk for fall due to : No Fall Risks No Fall Risks No Fall Risks No Fall Risks No Fall Risks  Follow up Falls evaluation completed Falls evaluation completed Falls evaluation completed Falls evaluation completed Falls evaluation completed    FALL RISK PREVENTION PERTAINING TO THE HOME:  Any stairs in or around the home? Yes  If so, are there any without handrails? Yes  Home free of loose throw rugs in walkways, pet beds, electrical cords, etc? Yes  Adequate lighting in your home to reduce risk of falls? Yes   ASSISTIVE DEVICES UTILIZED TO PREVENT FALLS:  Life alert? No  Use of a cane, walker or w/c? No  Grab bars in the bathroom? Yes  Shower chair or bench in shower? No  Elevated toilet seat or a handicapped toilet? Yes   Cognitive Function:        05/07/2022    9:52 AM 04/25/2019    8:56 AM 04/21/2018    8:36 AM 04/02/2017    9:12 AM  6CIT Screen  What Year? 0 points 0 points 0 points 0 points  What month? 0 points 0 points 0 points 0 points  What time? 0 points 0 points 0 points 0 points  Count back from 20 0 points 0 points 0 points 0 points  Months in reverse 0 points 0 points 0 points 2 points  Repeat phrase 0 points 0 points 0  points 0 points  Total Score 0 points 0 points 0 points 2 points    Immunizations Immunization History  Administered Date(s) Administered   Influenza, High Dose Seasonal PF 04/05/2015, 03/18/2016, 03/06/2017, 03/18/2019, 04/16/2020, 04/04/2021   Influenza-Unspecified 03/24/2019, 04/06/2022   Moderna Covid-19 Vaccine Bivalent Booster 69yr & up 04/17/2021   PFIZER Comirnaty(Gray Top)Covid-19 Tri-Sucrose  Vaccine 07/30/2019, 08/21/2019   PFIZER(Purple Top)SARS-COV-2 Vaccination 03/27/2020, 09/11/2020   PNEUMOCOCCAL CONJUGATE-20 12/11/2021   Pneumococcal Conjugate-13 03/08/2014   Pneumococcal Polysaccharide-23 09/17/2012   Tdap 09/29/2014   Zoster Recombinat (Shingrix) 07/08/2018, 12/14/2018    TDAP status: Up to date  Flu Vaccine status: Up to date  Pneumococcal vaccine status: Up to date  Covid-19 vaccine status: Completed vaccines  Qualifies for Shingles Vaccine? Yes   Zostavax completed Yes   Shingrix Completed?: Yes  Screening Tests Health Maintenance  Topic Date Due   COVID-19 Vaccine (6 - 2023-24 season) 05/23/2022 (Originally 02/07/2022)   MAMMOGRAM  07/22/2022   Medicare Annual Wellness ([place]  05/08/2023   COLONOSCOPY (Pts 45-441yrInsurance coverage will need to be confirmed)  02/21/2027   Pneumonia Vaccine 6560Years old  Completed   INFLUENZA VACCINE  Completed   DEXA SCAN  Completed   Hepatitis C Screening  Completed   Zoster Vaccines- Shingrix  Completed   HPV VACCINES  Aged Out    Health Maintenance  There are no preventive care reminders to display for this patient.   Colorectal cancer screening: Type of screening: Colonoscopy. Completed 02/20/2022. Repeat every 5 years  Mammogram status: Completed 07/22/2021. Repeat every year  Bone Density status: Completed 05/30/2019. Results reflect: Bone density results: OSTEOPOROSIS. Repeat every 3 years.  Lung Cancer Screening: (Low Dose CT Chest recommended if Age 286-80ears, 30 pack-year currently smoking OR have quit w/in 15years.) does qualify.   Additional Screening:  Hepatitis C Screening: does qualify; Completed 05/08/2017  Vision Screening: Recommended annual ophthalmology exams for early detection of glaucoma and other disorders of the eye. Is the patient up to date with their annual eye exam?  Yes  Who is the provider or what is the name of the office in which the patient attends annual eye exams? Dr  ShCarolee RotaC If pt is not established with a provider, would they like to be referred to a provider to establish care?  N/A .   Dental Screening: Recommended annual dental exams for proper oral hygiene  Community Resource Referral / Chronic Care Management: CRR required this visit?  No   CCM required this visit?  No      Plan:     I have personally reviewed and noted the following in the patient's chart:   Medical and social history Use of alcohol, tobacco or illicit drugs  Current medications and supplements including opioid prescriptions. Patient is not currently taking opioid prescriptions. Functional ability and status Nutritional status Physical activity Advanced directives List of other physicians Hospitalizations, surgeries, and ER visits in previous 12 months Vitals Screenings to include cognitive, depression, and falls Referrals and appointments  In addition, I have reviewed and discussed with patient certain preventive protocols, quality metrics, and best practice recommendations. A written personalized care plan for preventive services as well as general preventive health recommendations were provided to patient.     ChClista BernhardtCMPinehurst 05/07/2022    Ms. Vuncannon , Thank you for taking time to come for your Medicare Wellness Visit. I appreciate your ongoing commitment to your health goals. Please review the following  plan we discussed and let me know if I can assist you in the future.   These are the goals we discussed:  Goals      Quit smoking / using tobacco     Recommend to attend smoking cessation classes or contact 1-800-QUIT-NOW        This is a list of the screening recommended for you and due dates:  Health Maintenance  Topic Date Due   COVID-19 Vaccine (6 - 2023-24 season) 05/23/2022*   Mammogram  07/22/2022   Medicare Annual Wellness Visit  05/08/2023   Colon Cancer Screening  02/21/2027   Pneumonia Vaccine  Completed    Flu Shot  Completed   DEXA scan (bone density measurement)  Completed   Hepatitis C Screening: USPSTF Recommendation to screen - Ages 8-79 yo.  Completed   Zoster (Shingles) Vaccine  Completed   HPV Vaccine  Aged Out  *Topic was postponed. The date shown is not the original due date.     Nurse Notes: None.

## 2022-06-19 ENCOUNTER — Ambulatory Visit: Payer: Medicare Other

## 2022-06-19 ENCOUNTER — Ambulatory Visit
Admission: EM | Admit: 2022-06-19 | Discharge: 2022-06-19 | Disposition: A | Discharge status: Home / Self Care | Payer: Medicare Other

## 2022-06-19 ENCOUNTER — Ambulatory Visit: Admission: RE | Admit: 2022-06-19 | Payer: Medicare Other | Source: Ambulatory Visit

## 2022-06-19 DIAGNOSIS — S61412A Laceration without foreign body of left hand, initial encounter: Secondary | ICD-10-CM

## 2022-06-19 NOTE — ED Provider Notes (Signed)
[facility]    CSN: [id number] Arrival date & time: 06/19/22  1608      History   Chief Complaint Chief Complaint  Patient presents with   skin tare    HPI Lindsey Golden is a 77 y.o. female presenting for laceration of the left hand.  Patient says she sustained a laceration a few hours ago.  She says that she tripped on her cat and her hand hit a wall.  She denies any significant pain in the hand or bruising.  No swelling of the hand.  Bleeding is controlled for the most part but sometimes when she uses her hand to start to rebleed.  She has not had any numbness or weakness.  She cleaned the area with hydrogen peroxide and has applied Neosporin.  She reports that she thinks it might need to be closed up and she is fearful of potential infection.  She did not fall all the way down or hit her head.  She denies any loss of consciousness.  No other injuries or complaints.  HPI  Past Medical History:  Diagnosis Date   Allergy    Asthma    mild intermittent due to allergies.   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 Vira Agar   COLONOSCOPY N/A 02/20/2022   Procedure: COLONOSCOPY;  Surgeon: [name], MD;  Location: [place];  Service: Endoscopy;  Laterality: N/A;   ESOPHAGOGASTRODUODENOSCOPY N/A 02/20/2022   Procedure: ESOPHAGOGASTRODUODENOSCOPY (EGD) WITH BIOPSY;  Surgeon: [name], MD;  Location: [place];  Service: Endoscopy;  Laterality: N/A;   KNEE SURGERY     five different surgery    OB History   No obstetric history on file.      Home Medications    Prior to Admission medications   Medication Sig Start Date End Date Taking? Authorizing Provider  CALCIUM-VITAMIN D PO Take by mouth daily.    [provider]  famotidine (PEPCID) 10 MG tablet Take 10 mg by mouth daily.    [provider]  ferrous sulfate 325 (65 FE) MG tablet Take 325 mg by mouth daily with breakfast.    [provider]  Fluticasone-Umeclidin-Vilant (TRELEGY ELLIPTA) 100-62.5-25 MCG/ACT AEPB Inhale 1 puff into the lungs daily. 10/16/21   Glean Hess, MD  gabapentin (NEURONTIN) 100 MG capsule Take 1 capsule (100 mg total) by mouth 4 (four) times daily. Patient taking differently: Take 100 mg by mouth 4 (four)  times daily. Takes 200 mg BID 12/11/21   Glean Hess, MD  levothyroxine (SYNTHROID) 75 MCG tablet TAKE 1 TABLET BY MOUTH ONCE DAILY ON AN EMPTY STOMACH. WAIT 30 MINUTES BEFORE TAKING OTHER MEDS. 01/08/22   Glean Hess, MD  Multiple Vitamins-Minerals (MULTIPLE VITAMINS/WOMENS PO) Take by mouth.    [provider]  VENTOLIN HFA 108 (90 Base) MCG/ACT inhaler INHALE 2 PUFFS BY MOUTH EVERY 6 HOURS ASNEEDED WHEEZING/ SHORTNESS OF BREATH 04/22/22   Glean Hess, MD  Zoledronic Acid (RECLAST IV) Inject into the vein. April    [provider]    Family History Family History  Problem Relation Age of Onset    Depression Mother    Hearing loss Mother    Mental illness Mother    Miscarriages / [place] Mother    Asthma Father    Kidney disease Father    Stroke Father    Varicose Veins Father    Breast cancer Neg Hx     Social History Social History   Tobacco Use   Smoking status: Every Day    Packs/day: 0.20    Years: 40.00    Total pack years: 8.00    Types: Cigarettes   Smokeless tobacco: Never   Tobacco comments:    4-5 cigarettes daily.  Off and on since age 15  Vaping Use   Vaping Use: Never used  Substance Use Topics   Alcohol use: Yes    Comment: Occasional   Drug use: No     Allergies   Aspirin, Penicillins, and Dairy aid [tilactase]   Review of Systems Review of Systems  Musculoskeletal:  Negative for arthralgias and joint swelling.  Skin:  Positive for color change and wound.  Neurological:  Negative for weakness and numbness.     Physical Exam Triage Vital Signs ED Triage Vitals  Enc Vitals Group     BP 06/19/22 1640 (!) 142/86     Pulse Rate 06/19/22 1640 73     Resp 06/19/22 1640 20     Temp 06/19/22 1640 98.2 F (36.8 C)     Temp Source 06/19/22 1640 Oral     SpO2 06/19/22 1640 94 %     Weight --      Height --      Head Circumference --      Peak Flow --      Pain Score 06/19/22 1641 2     Pain Loc --      Pain Edu? --      Excl. in [place]? --    No data found.  Updated Vital Signs BP (!) 142/86 (BP Location: Left Arm)   Pulse 73   Temp 98.2 F (36.8 C) (Oral)   Resp 20   SpO2 94%     Physical Exam Vitals and nursing note reviewed.  Constitutional:      General: She is not in acute distress.    Appearance: Normal appearance. She is not ill-appearing or toxic-appearing.  HENT:     Head: Normocephalic and atraumatic.  Eyes:     General: No scleral icterus.       Right eye: No discharge.        Left eye: No discharge.     Conjunctiva/sclera: Conjunctivae normal.  Cardiovascular:     Rate and Rhythm: Normal rate and regular  rhythm.     Pulses: Normal pulses.  Pulmonary:     Effort: Pulmonary effort is normal. No respiratory distress.  Musculoskeletal:  Cervical back: Neck supple.  Skin:    General: Skin is dry.     Comments: 3 cm curved laceration with ecchymosis left dorsal hand. Minimal bleeding  Neurological:     General: No focal deficit present.     Mental Status: She is alert. Mental status is at baseline.     Motor: No weakness.     Gait: Gait normal.  Psychiatric:        Mood and Affect: Mood normal.        Behavior: Behavior normal.        Thought Content: Thought content normal.      UC Treatments / Results  Labs (all labs ordered are listed, but only abnormal results are displayed) Labs Reviewed - No data to display  EKG   Radiology No results found.  Procedures Procedures (including critical care time)  Medications Ordered in UC Medications - No data to display  Initial Impression / Assessment and Plan / UC Course  I have reviewed the triage vital signs and the nursing notes.  Pertinent labs & imaging results that were available during my care of the patient were reviewed by me and considered in my medical decision making (see chart for details).   77 year old female presents for superficial laceration/skin tear of the left dorsal hand after falling against the wall and hitting her hand on the wall.  This occurred a few hours ago.  On examination, bleeding is controlled.  I cleaned the area with wound cleanser and sealed the wound with Dermabond.  Then, used nonadherent pad and Coban to cover the wound.  Patient did give consent for all of this.  We discussed wound care guidelines, supportive care, pain management, return and ER precautions.   Final Clinical Impressions(s) / UC Diagnoses   Final diagnoses:  Laceration of left hand without foreign body, initial encounter     Discharge Instructions      -I applied tissue adhesive to seal the wound. - Try to get the  area wet for the glue can come off. - You may apply ice to the area but make sure it does not get wet. Wrap something like a cloth around it. - Tylenol as needed for pain only. - You need to be seen if you have any increase in pain, fever or signs of infection.     ED Prescriptions   None    PDMP not reviewed this encounter.   Danton Clap, PA-C 06/19/22 1721

## 2022-06-19 NOTE — ED Triage Notes (Signed)
Pt reports she tripped and her hand hit the wall leaving a skin tare on her left hand since 12pm today. Pt put neosporin and peroxide on it.

## 2022-06-19 NOTE — Discharge Instructions (Addendum)
-  I applied tissue adhesive to seal the wound. - Try to get the area wet for the glue can come off. - You may apply ice to the area but make sure it does not get wet. Wrap something like a cloth around it. - Tylenol as needed for pain only. - You need to be seen if you have any increase in pain, fever or signs of infection.

## 2022-07-02 ENCOUNTER — Other Ambulatory Visit: Payer: Self-pay | Admitting: Internal Medicine

## 2022-07-02 ENCOUNTER — Telehealth: Payer: Self-pay | Admitting: Internal Medicine

## 2022-07-02 DIAGNOSIS — R202 Paresthesia of skin: Secondary | ICD-10-CM

## 2022-07-02 DIAGNOSIS — E034 Atrophy of thyroid (acquired): Secondary | ICD-10-CM

## 2022-07-02 MED ORDER — GABAPENTIN 100 MG PO CAPS: 100.0000 mg | ORAL_CAPSULE | Freq: Four times a day (QID) | ORAL | 1 refills | Status: DC

## 2022-07-02 MED ORDER — LEVOTHYROXINE SODIUM 75 MCG PO TABS: ORAL_TABLET | ORAL | 2 refills | Status: DC

## 2022-07-02 NOTE — Telephone Encounter (Signed)
Copied from [place] [id number]. Topic: General - Other >> Jul 02, 2022 11:39 AM Cyndi Bender wrote: Reason for CRM: Marcie Bal with [facility] reports that they are unable to get the levothyroxine (SYNTHROID) 75 MCG tablet from the same manufacturer that pt previously was getting from her previous pharmacy. Marcie Bal asked if the manufacturer could be changed to [employer] or Alvogen. Cb# [phone]

## 2022-07-02 NOTE — Telephone Encounter (Signed)
Copied from [place] [id number]. Topic: General - Other >> Jul 02, 2022 12:24 PM Everette C wrote: Reason for CRM: Medication Refill - Medication: gabapentin (NEURONTIN) 100 MG capsule [[id number]]   levothyroxine (SYNTHROID) 75 MCG tablet [[id number]]   Has the patient contacted their pharmacy? Yes.   (Agent: If no, request that the patient contact the pharmacy for the refill. If patient does not wish to contact the pharmacy document the reason why and proceed with request.) (Agent: If yes, when and what did the pharmacy advise?)  Preferred Pharmacy (with phone number or street name): [place], [place] - [address] [place] [address] Phone: [phone] Fax: [phone] Hours: Not open 24 hours   Has the patient been seen for an appointment in the last year OR does the patient have an upcoming appointment? Yes.    Agent: Please be advised that RX refills may take up to 3 business days. We ask that you follow-up with your pharmacy.

## 2022-07-02 NOTE — Telephone Encounter (Signed)
Future visit in 5 months. Requested Prescriptions  Pending Prescriptions Disp Refills   levothyroxine (SYNTHROID) 75 MCG tablet 90 tablet 2    Sig: TAKE 1 TABLET BY MOUTH ONCE DAILY ON AN EMPTY STOMACH. WAIT 30 MINUTES BEFORE TAKING OTHER MEDS.     Endocrinology:  Hypothyroid Agents Failed - 07/02/2022 12:34 PM      Failed - TSH in normal range and within 360 days    TSH  Date Value Ref Range Status  12/11/2021 0.328 (L) 0.450 - 4.500 uIU/mL Final         Passed - Valid encounter within last 12 months    Recent Outpatient Visits           6 months ago Mild intermittent asthma, unspecified whether complicated   [place] [facility] at [facility], Jesse Sans, MD   8 months ago Mild intermittent asthma, unspecified whether complicated   [place] at [facility], Jesse Sans, MD   11 months ago Abdominal wall pain   [place] [facility] at [facility], Jesse Sans, MD   1 year ago Mild intermittent asthma, unspecified whether complicated   [place] at [facility], Jesse Sans, MD   1 year ago Hypothyroidism due to acquired atrophy of thyroid   [place] at [facility], Jesse Sans, MD       Future Appointments             In 5 months Glean Hess, MD [place] at [facility]             gabapentin (NEURONTIN) 100 MG capsule 360 capsule 1    Sig: Take 1 capsule (100 mg total) by mouth 4 (four) times daily.     Neurology: Anticonvulsants - gabapentin Passed - 07/02/2022 12:34 PM      Passed - Cr in normal range and within 360 days    Creat  Date Value Ref Range Status  09/02/2016 0.65 0.50 - 0.99 mg/dL Final    Comment:      For patients > or = 75 years of age: The upper reference limit for Creatinine is approximately 13% higher for  people identified as African-American.      Creatinine, Ser  Date Value Ref Range Status  12/11/2021 0.68 0.57 - 1.00 mg/dL Final         Passed - Completed PHQ-2 or PHQ-9 in the last 360 days      Passed - Valid encounter within last 12 months    Recent Outpatient Visits           6 months ago Mild intermittent asthma, unspecified whether complicated   [place] [facility] at [facility], Jesse Sans, MD   8 months ago Mild intermittent asthma, unspecified whether complicated   [place] at [facility], Jesse Sans, MD   11 months ago Abdominal wall pain   [place] [facility] [place] [facility], Jesse Sans, MD   1 year ago Mild intermittent asthma, unspecified whether complicated   [place] at [facility], Jesse Sans, MD   1 year ago Hypothyroidism due to acquired atrophy of thyroid   [place]  [facility] at [facility], Jesse Sans, MD       Future Appointments             In 5 months Army Melia Jesse Sans, MD [place] at [facility], [facility]

## 2022-07-09 ENCOUNTER — Other Ambulatory Visit: Payer: Self-pay | Admitting: Internal Medicine

## 2022-07-09 DIAGNOSIS — J4520 Mild intermittent asthma, uncomplicated: Secondary | ICD-10-CM

## 2022-07-09 MED ORDER — ALBUTEROL SULFATE HFA 108 (90 BASE) MCG/ACT IN AERS: 2.0000 | INHALATION_SPRAY | Freq: Four times a day (QID) | RESPIRATORY_TRACT | 3 refills | Status: DC | PRN

## 2022-09-16 DIAGNOSIS — M810 Age-related osteoporosis without current pathological fracture: Secondary | ICD-10-CM | POA: Diagnosis not present

## 2022-09-22 ENCOUNTER — Other Ambulatory Visit: Payer: Self-pay | Admitting: Internal Medicine

## 2022-09-22 DIAGNOSIS — J4520 Mild intermittent asthma, uncomplicated: Secondary | ICD-10-CM

## 2022-09-23 DIAGNOSIS — E039 Hypothyroidism, unspecified: Secondary | ICD-10-CM | POA: Diagnosis not present

## 2022-09-23 DIAGNOSIS — M810 Age-related osteoporosis without current pathological fracture: Secondary | ICD-10-CM | POA: Diagnosis not present

## 2022-09-23 DIAGNOSIS — F17210 Nicotine dependence, cigarettes, uncomplicated: Secondary | ICD-10-CM | POA: Diagnosis not present

## 2022-10-15 DIAGNOSIS — M810 Age-related osteoporosis without current pathological fracture: Secondary | ICD-10-CM | POA: Diagnosis not present

## 2022-10-30 ENCOUNTER — Other Ambulatory Visit: Payer: Self-pay | Admitting: Internal Medicine

## 2022-10-30 DIAGNOSIS — J4520 Mild intermittent asthma, uncomplicated: Secondary | ICD-10-CM

## 2022-10-30 NOTE — Telephone Encounter (Signed)
Requested Prescriptions  Pending Prescriptions Disp Refills   VENTOLIN HFA 108 (90 Base) MCG/ACT inhaler [Pharmacy Med Name: Ventolin HFA 108 (90 Base) MCG/ACT Inhalation Aerosol Solution] 18 g 0    Sig: INHALE 2 PUFFS BY MOUTH EVERY 6 HOURS AS NEEDED FOR WHEEZING FOR SHORTNESS OF BREATH     Pulmonology:  Beta Agonists 2 Failed - 10/30/2022  6:53 AM      Failed - Last BP in normal range    BP Readings from Last 1 Encounters:  06/19/22 (!) 142/86         Passed - Last Heart Rate in normal range    Pulse Readings from Last 1 Encounters:  06/19/22 73         Passed - Valid encounter within last 12 months    Recent Outpatient Visits           10 months ago Mild intermittent asthma, unspecified whether complicated   [place] [facility] at [facility], Nyoka Cowden, MD   1 year ago Mild intermittent asthma, unspecified whether complicated   [place] [facility] at [facility], Nyoka Cowden, MD   1 year ago Abdominal wall pain   [place] [facility] at [facility], Nyoka Cowden, MD   1 year ago Mild intermittent asthma, unspecified whether complicated   [place] [facility] at [facility], Nyoka Cowden, MD   1 year ago Hypothyroidism due to acquired atrophy of thyroid   [facility] at [facility], Nyoka Cowden, MD       Future Appointments             In 1 month Judithann Graves, Nyoka Cowden, MD [facility] at [facility], [facility]

## 2022-11-21 ENCOUNTER — Other Ambulatory Visit: Payer: Self-pay | Admitting: Internal Medicine

## 2022-11-21 DIAGNOSIS — J4520 Mild intermittent asthma, uncomplicated: Secondary | ICD-10-CM

## 2022-12-15 ENCOUNTER — Encounter: Payer: Self-pay | Admitting: Internal Medicine

## 2022-12-15 ENCOUNTER — Ambulatory Visit (INDEPENDENT_AMBULATORY_CARE_PROVIDER_SITE_OTHER): Payer: Medicare Other | Admitting: Internal Medicine

## 2022-12-15 VITALS — BP 124/68 | HR 75 | Ht 64.0 in | Wt 113.2 lb

## 2022-12-15 DIAGNOSIS — G250 Essential tremor: Secondary | ICD-10-CM | POA: Insufficient documentation

## 2022-12-15 DIAGNOSIS — Z1231 Encounter for screening mammogram for malignant neoplasm of breast: Secondary | ICD-10-CM | POA: Diagnosis not present

## 2022-12-15 DIAGNOSIS — J4520 Mild intermittent asthma, uncomplicated: Secondary | ICD-10-CM

## 2022-12-15 DIAGNOSIS — R202 Paresthesia of skin: Secondary | ICD-10-CM

## 2022-12-15 DIAGNOSIS — E782 Mixed hyperlipidemia: Secondary | ICD-10-CM | POA: Diagnosis not present

## 2022-12-15 DIAGNOSIS — E034 Atrophy of thyroid (acquired): Secondary | ICD-10-CM | POA: Diagnosis not present

## 2022-12-15 DIAGNOSIS — M810 Age-related osteoporosis without current pathological fracture: Secondary | ICD-10-CM | POA: Diagnosis not present

## 2022-12-15 DIAGNOSIS — K219 Gastro-esophageal reflux disease without esophagitis: Secondary | ICD-10-CM

## 2022-12-15 MED ORDER — GABAPENTIN 100 MG PO CAPS: 100.0000 mg | ORAL_CAPSULE | Freq: Four times a day (QID) | ORAL | 1 refills | Status: DC

## 2022-12-15 NOTE — Patient Instructions (Signed)
Call [facility] to schedule your mammogram at [phone].  

## 2022-12-15 NOTE — Assessment & Plan Note (Addendum)
Cholesterol managed with diet alone. Lab Results  Component Value Date   LDLCALC 95 12/11/2021

## 2022-12-15 NOTE — Assessment & Plan Note (Signed)
Supplemented. Lab Results  Component Value Date   TSH 0.328 (L) 12/11/2021

## 2022-12-15 NOTE — Assessment & Plan Note (Signed)
Symptoms controlled on Trelegy and prn albuterol

## 2022-12-15 NOTE — Assessment & Plan Note (Signed)
Mild tremor improves with a glass of wine No symptoms of worsening

## 2022-12-15 NOTE — Progress Notes (Signed)
Date:  12/15/2022   Name:  Lindsey Golden   DOB:  Nov 07, 1945   MRN:  [id number]   Chief Complaint: Annual Exam Lindsey Golden is a 77 y.o. female who presents today for her Complete Annual Exam. She feels well. She reports exercising/ stretches. She reports she is sleeping well. Breast complaints - none.  Mammogram: 07/2021 DEXA: 05/2019 osteoporosis Colonoscopy:02/2022 repeat 5 yrs  Health Maintenance Due  Topic Date Due   COVID-19 Vaccine (6 - 2023-24 season) 02/07/2022   MAMMOGRAM  07/22/2022    Immunization History  Administered Date(s) Administered   Influenza, High Dose Seasonal PF 04/05/2015, 03/18/2016, 03/06/2017, 03/18/2019, 04/16/2020, 04/04/2021   Influenza-Unspecified 03/24/2019, 04/06/2022   Moderna Covid-19 Vaccine Bivalent Booster 10yrs & up 04/17/2021   PFIZER Comirnaty(Gray Top)Covid-19 Tri-Sucrose Vaccine 07/30/2019, 08/21/2019   PFIZER(Purple Top)SARS-COV-2 Vaccination 03/27/2020, 09/11/2020   PNEUMOCOCCAL CONJUGATE-20 12/11/2021   Pneumococcal Conjugate-13 03/08/2014   Pneumococcal Polysaccharide-23 09/17/2012   Tdap 09/29/2014   Zoster Recombinant(Shingrix) 07/08/2018, 12/14/2018    Gastroesophageal Reflux She complains of heartburn. She reports no abdominal pain, no chest pain, no coughing or no wheezing. This is a recurrent problem. The problem occurs rarely. Associated symptoms include weight loss (related to stress caring for her brother). Pertinent negatives include no fatigue. She has tried a PPI for the symptoms.  Thyroid Problem Presents for follow-up visit. Symptoms include tremors and weight loss (related to stress caring for her brother). Patient reports no anxiety, constipation, diarrhea, fatigue or palpitations. The symptoms have been stable.  COPD -shortness of breath is stable on Trelegy and prn albuterol.  No recent flares or URIs.  Albuterol causes transient tremors but otherwise no SE.  Lab Results  Component Value Date    NA 141 12/11/2021   K 4.3 12/11/2021   CO2 22 12/11/2021   GLUCOSE 102 (H) 12/11/2021   BUN 11 12/11/2021   CREATININE 0.68 12/11/2021   CALCIUM 9.1 12/11/2021   EGFR 91 12/11/2021   GFRNONAA 76 06/08/2019   Lab Results  Component Value Date   CHOL 199 12/11/2021   HDL 91 12/11/2021   LDLCALC 95 12/11/2021   TRIG 73 12/11/2021   CHOLHDL 2.2 12/11/2021   Lab Results  Component Value Date   TSH 0.328 (L) 12/11/2021   Lab Results  Component Value Date   HGBA1C 5.0 09/02/2016   Lab Results  Component Value Date   WBC 9.9 12/11/2021   HGB 13.6 12/11/2021   HCT 41.0 12/11/2021   MCV 99 (H) 12/11/2021   PLT 220 12/11/2021   Lab Results  Component Value Date   ALT 10 12/11/2021   AST 24 12/11/2021   ALKPHOS 59 12/11/2021   BILITOT 0.5 12/11/2021   Lab Results  Component Value Date   VD25OH 56 11/29/2019     Review of Systems  Constitutional:  Positive for unexpected weight change and weight loss (related to stress caring for her brother). Negative for chills, fatigue and fever.  HENT:  Negative for congestion, hearing loss, tinnitus, trouble swallowing and voice change.   Eyes:  Negative for visual disturbance.  Respiratory:  Negative for cough, chest tightness, shortness of breath and wheezing.   Cardiovascular:  Negative for chest pain, palpitations and leg swelling.  Gastrointestinal:  Positive for heartburn. Negative for abdominal pain, constipation, diarrhea and vomiting.  Endocrine: Negative for polydipsia and polyuria.  Genitourinary:  Negative for dysuria, frequency, genital sores, vaginal bleeding and vaginal discharge.  Musculoskeletal:  Negative for arthralgias, gait problem and  joint swelling.  Skin:  Negative for color change and rash.  Neurological:  Positive for tremors. Negative for dizziness, light-headedness and headaches.  Hematological:  Negative for adenopathy. Does not bruise/bleed easily.  Psychiatric/Behavioral:  Negative for dysphoric  mood and sleep disturbance. The patient is not nervous/anxious.     Patient Active Problem List   Diagnosis Date Noted   Benign essential tremor 12/15/2022   History of colonic polyps    Gastritis without bleeding    Mild intermittent asthma 07/10/2021   Lower leg edema 12/17/2020   Mixed hyperlipidemia 12/17/2020   History of Helicobacter pylori infection 07/01/2019   GERD without esophagitis 06/08/2019   Age-related osteoporosis without current pathological fracture 06/08/2019   Hypothyroidism due to acquired atrophy of thyroid 11/02/2018   Vitamin D deficiency 02/16/2018   Tingling in extremities 02/16/2018   Myalgia 02/02/2018   Adenomatous colon polyp 10/07/2017   AVM (arteriovenous malformation) of small bowel, 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 Mechele Collin   COLONOSCOPY N/A 02/20/2022   Procedure: COLONOSCOPY;  Surgeon: Midge Minium, MD;  Location: [facility];  Service: Endoscopy;  Laterality: N/A;   ESOPHAGOGASTRODUODENOSCOPY N/A 02/20/2022   Procedure: ESOPHAGOGASTRODUODENOSCOPY (EGD) WITH BIOPSY;  Surgeon: Midge Minium, MD;  Location: [facility];  Service: Endoscopy;  Laterality: N/A;   KNEE SURGERY     five different surgery    Social History   Tobacco Use   Smoking status: Every Day    Packs/day: 0.20    Years: 40.00    Additional pack years: 0.00    Total pack years: 8.00    Types: Cigarettes   Smokeless tobacco: Never   Tobacco comments:    4-5 cigarettes daily.  Off and on since age 43  Vaping Use   Vaping  Use: Never used  Substance Use Topics   Alcohol use: Yes    Comment: Occasional   Drug use: No     Medication list has been reviewed and updated.  Current Meds  Medication Sig   CALCIUM-VITAMIN D PO Take by mouth daily.   famotidine (PEPCID) 10 MG tablet Take 10 mg by mouth daily.   ferrous sulfate 325 (65 FE) MG tablet Take 325 mg by mouth daily with breakfast.   Fluticasone-Umeclidin-Vilant (TRELEGY ELLIPTA) 100-62.5-25 MCG/ACT AEPB INHALE 1 PUFF ONCE DAILY RINSE  MOUTH  AFTER  EACH  USE   levothyroxine (SYNTHROID) 75 MCG tablet TAKE 1 TABLET BY MOUTH ONCE DAILY ON AN EMPTY STOMACH. WAIT 30 MINUTES BEFORE TAKING OTHER MEDS.   Multiple Vitamins-Minerals (MULTIPLE VITAMINS/WOMENS PO) Take by mouth.   VENTOLIN HFA 108 (90 Base) MCG/ACT inhaler INHALE 2 PUFFS BY MOUTH EVERY 6 HOURS AS NEEDED FOR WHEEZING FOR SHORTNESS OF BREATH   Zoledronic Acid (RECLAST IV) Inject into the vein. April   [DISCONTINUED] gabapentin (NEURONTIN) 100 MG capsule Take 1 capsule (100 mg total) by mouth 4 (four) times daily.       12/15/2022    8:41 AM 12/11/2021   10:13 AM 10/16/2021  8:24 AM 07/10/2021   10:31 AM  GAD 7 : Generalized Anxiety Score  Nervous, Anxious, on Edge 0 0 0 0  Control/stop worrying 0 0 0 0  Worry too much - different things 0 0 0 0  Trouble relaxing 0 0 0 0  Restless 0 0 0 0  Easily annoyed or irritable 0 0 0 0  Afraid - awful might happen 0 0 0 0  Total GAD 7 Score 0 0 0 0  Anxiety Difficulty Not difficult at all Not difficult at all Not difficult at all        12/15/2022    8:41 AM 05/07/2022    9:50 AM 12/11/2021   10:13 AM  Depression screen PHQ 2/9  Decreased Interest 0 0 0  Down, Depressed, Hopeless 0 0 0  PHQ - 2 Score 0 0 0  Altered sleeping 0 0 0  Tired, decreased energy 0 0 0  Change in appetite 0 0 0  Feeling bad or failure about yourself  0 0 0  Trouble concentrating 0 0 0  Moving slowly or fidgety/restless 0 0 0  Suicidal thoughts 0 0 0  PHQ-9 Score 0 0 0   Difficult doing work/chores Not difficult at all Not difficult at all Not difficult at all    BP Readings from Last 3 Encounters:  12/15/22 124/68  06/19/22 (!) 142/86  03/10/22 133/83    Physical Exam Vitals and nursing note reviewed.  Constitutional:      General: She is not in acute distress.    Appearance: She is well-developed.  HENT:     Head: Normocephalic and atraumatic.     Right Ear: Tympanic membrane and ear canal normal.     Left Ear: Tympanic membrane and ear canal normal.     Nose:     Right Sinus: No maxillary sinus tenderness.     Left Sinus: No maxillary sinus tenderness.  Eyes:     General: No scleral icterus.       Right eye: No discharge.        Left eye: No discharge.     Conjunctiva/sclera: Conjunctivae normal.  Neck:     Thyroid: No thyromegaly.     Vascular: No carotid bruit.  Cardiovascular:     Rate and Rhythm: Normal rate and regular rhythm.     Pulses: Normal pulses.     Heart sounds: Normal heart sounds.  Pulmonary:     Effort: Pulmonary effort is normal. No respiratory distress.     Breath sounds: No wheezing.  Chest:  Breasts:    Right: No mass, nipple discharge, skin change or tenderness.     Left: No mass, nipple discharge, skin change or tenderness.  Abdominal:     General: Bowel sounds are normal.     Palpations: Abdomen is soft.     Tenderness: There is no abdominal tenderness.  Musculoskeletal:     Cervical back: Normal range of motion. No erythema.     Right lower leg: No edema.     Left lower leg: No edema.  Lymphadenopathy:     Cervical: No cervical adenopathy.  Skin:    General: Skin is warm and dry.     Findings: No rash.  Neurological:     Mental Status: She is alert and oriented to person, place, and time.     Cranial Nerves: No cranial nerve deficit.     Sensory: Sensation is intact. No sensory deficit.     Motor: Weakness (  of left hand) and tremor present.     Deep Tendon Reflexes: Reflexes are normal and  symmetric.  Psychiatric:        Attention and Perception: Attention normal.        Mood and Affect: Mood normal.     Wt Readings from Last 3 Encounters:  12/15/22 113 lb 3.2 oz (51.3 kg)  05/07/22 117 lb (53.1 kg)  03/10/22 117 lb 12.8 oz (53.4 kg)    BP 124/68   Pulse 75   Ht 5\' 4"  (1.626 m)   Wt 113 lb 3.2 oz (51.3 kg)   SpO2 97%   BMI 19.43 kg/m   Assessment and Plan:  Problem List Items Addressed This Visit     Tingling in extremities   Relevant Medications   gabapentin (NEURONTIN) 100 MG capsule   Mixed hyperlipidemia (Chronic)    Cholesterol managed with diet alone. 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 Lab Results  Component Value Date   TSH 0.328 (L) 12/11/2021        Relevant Orders   TSH + free T4   GERD without esophagitis (Chronic)    Reflux symptoms are minimal on current therapy - Pepcid. No red flag signs such as unintentional weight loss, n/v, melena       Relevant Orders   CBC with Differential/Platelet   Benign essential tremor    Mild tremor improves with a glass of wine No symptoms of worsening      Age-related osteoporosis without current pathological fracture (Chronic)    Followed by Endo On Reclast infusions and Calcium and vitamin D      Relevant Orders   Comprehensive metabolic panel   Other Visit Diagnoses     Encounter for screening mammogram for breast cancer       Relevant Orders   MM 3D SCREENING MAMMOGRAM BILATERAL BREAST       Return in about 6 months (around 06/17/2023) for thyroid.   Partially dictated using Dragon software, any errors are not intentional.  Reubin Milan,  MD [facility] Medicine [place], [place]

## 2022-12-15 NOTE — Assessment & Plan Note (Signed)
Followed by Endo On Reclast infusions and Calcium and vitamin D

## 2022-12-15 NOTE — Assessment & Plan Note (Addendum)
Reflux symptoms are minimal on current therapy - Pepcid. No red flag signs such as unintentional weight loss, n/v, melena

## 2022-12-16 ENCOUNTER — Other Ambulatory Visit: Payer: Self-pay

## 2022-12-16 DIAGNOSIS — E034 Atrophy of thyroid (acquired): Secondary | ICD-10-CM

## 2022-12-16 LAB — COMPREHENSIVE METABOLIC PANEL
ALT: 11 IU/L (ref 0–32)
AST: 25 IU/L (ref 0–40)
Albumin: 4.4 g/dL (ref 3.8–4.8)
Alkaline Phosphatase: 73 IU/L (ref 44–121)
BUN/Creatinine Ratio: 20 (ref 12–28)
BUN: 15 mg/dL (ref 8–27)
Bilirubin Total: 0.3 mg/dL (ref 0.0–1.2)
CO2: 24 mmol/L (ref 20–29)
Calcium: 9.9 mg/dL (ref 8.7–10.3)
Chloride: 103 mmol/L (ref 96–106)
Creatinine, Ser: 0.75 mg/dL (ref 0.57–1.00)
Globulin, Total: 2.5 g/dL (ref 1.5–4.5)
Glucose: 105 mg/dL — ABNORMAL HIGH (ref 70–99)
Potassium: 4.5 mmol/L (ref 3.5–5.2)
Sodium: 140 mmol/L (ref 134–144)
Total Protein: 6.9 g/dL (ref 6.0–8.5)
eGFR: 83 mL/min/{1.73_m2} (ref >59)

## 2022-12-16 LAB — CBC WITH DIFFERENTIAL/PLATELET
Basophils Absolute: 0.1 10*3/uL (ref 0.0–0.2)
Basos: 1 %
EOS (ABSOLUTE): 0.1 10*3/uL (ref 0.0–0.4)
Eos: 1 %
Hematocrit: 39.6 % (ref 34.0–46.6)
Hemoglobin: 13.2 g/dL (ref 11.1–15.9)
Immature Grans (Abs): 0 10*3/uL (ref 0.0–0.1)
Immature Granulocytes: 0 %
Lymphocytes Absolute: 2.1 10*3/uL (ref 0.7–3.1)
Lymphs: 20 %
MCH: 33.2 pg — ABNORMAL HIGH (ref 26.6–33.0)
MCHC: 33.3 g/dL (ref 31.5–35.7)
MCV: 100 fL — ABNORMAL HIGH (ref 79–97)
Monocytes Absolute: 0.6 10*3/uL (ref 0.1–0.9)
Monocytes: 6 %
Neutrophils Absolute: 7.8 10*3/uL — ABNORMAL HIGH (ref 1.4–7.0)
Neutrophils: 72 %
Platelets: 275 10*3/uL (ref 150–450)
RBC: 3.98 x10E6/uL (ref 3.77–5.28)
RDW: 11.1 % — ABNORMAL LOW (ref 11.7–15.4)
WBC: 10.7 10*3/uL (ref 3.4–10.8)

## 2022-12-16 LAB — LIPID PANEL
Chol/HDL Ratio: 2.3 ratio (ref 0.0–4.4)
Cholesterol, Total: 208 mg/dL — ABNORMAL HIGH (ref 100–199)
HDL: 91 mg/dL (ref >39)
LDL Chol Calc (NIH): 100 mg/dL — ABNORMAL HIGH (ref 0–99)
Triglycerides: 96 mg/dL (ref 0–149)
VLDL Cholesterol Cal: 17 mg/dL (ref 5–40)

## 2022-12-16 LAB — TSH+FREE T4
Free T4: 1.48 ng/dL (ref 0.82–1.77)
TSH: 0.698 u[IU]/mL (ref 0.450–4.500)

## 2022-12-16 MED ORDER — LEVOTHYROXINE SODIUM 75 MCG PO TABS: ORAL_TABLET | ORAL | 2 refills | Status: DC

## 2022-12-31 ENCOUNTER — Telehealth: Payer: Self-pay | Admitting: Internal Medicine

## 2022-12-31 NOTE — Telephone Encounter (Signed)
The patient called in stating she spoke with [facility] and they need a new script sent to them for her to get her Fluticasone-Umeclidin-Vilant (TRELEGY ELLIPTA) 100-62.5-25 MCG/ACT AEPB . It is just too expensive at her local pharmacy now. Please assist further by sending    [facility] - [place], [place] - [address] Phone: [phone]  Fax: [phone]

## 2023-01-01 ENCOUNTER — Other Ambulatory Visit: Payer: Self-pay

## 2023-01-01 ENCOUNTER — Other Ambulatory Visit: Payer: Self-pay | Admitting: Internal Medicine

## 2023-01-01 DIAGNOSIS — J4520 Mild intermittent asthma, uncomplicated: Secondary | ICD-10-CM

## 2023-01-01 MED ORDER — TRELEGY ELLIPTA 100-62.5-25 MCG/ACT IN AEPB: 1.0000 | INHALATION_SPRAY | Freq: Every day | RESPIRATORY_TRACT | 3 refills | Status: DC

## 2023-01-01 NOTE — Telephone Encounter (Signed)
Pt stated she needs the Rx for Fluticasone-Umeclidin-Vilant (TRELEGY ELLIPTA) 100-62.5-25 MCG/ACT AEPB to be sent to [facility] again because they informed her that they could not fill it because the Rx was sent another pharmacy. Pt stated she cancelled the Rx refill request at local pharmacy and request that the Rx be sent asap because she only has few pills remaining.   Medication Refill - Medication: Fluticasone-Umeclidin-Vilant (TRELEGY ELLIPTA) 100-62.5-25 MCG/ACT AEPB   Has the patient contacted their pharmacy? Yes.    Preferred Pharmacy (with phone number or street name):  [facility] - [place], [place] - [address] Phone: [phone]  Fax: [phone]     Has the patient been seen for an appointment in the last year OR does the patient have an upcoming appointment? Yes.    Agent: Please be advised that RX refills may take up to 3 business days. We ask that you follow-up with your pharmacy.

## 2023-01-01 NOTE — Telephone Encounter (Signed)
Refill sent to [facility].  KP

## 2023-01-02 NOTE — Telephone Encounter (Signed)
Requested medication (s) are due for refill today -no  Requested medication (s) are on the active medication list -yes  Future visit scheduled -yes  Last refill: 01/01/23  Notes to clinic: duplicate request- off protocol- sent by office yesterday   Requested Prescriptions  Pending Prescriptions Disp Refills   Fluticasone-Umeclidin-Vilant (TRELEGY ELLIPTA) 100-62.5-25 MCG/ACT AEPB 60 each 3    Sig: Inhale 1 Inhalation into the lungs daily.     Off-Protocol Failed - 01/01/2023  4:21 PM      Failed - Medication not assigned to a protocol, review manually.      Passed - Valid encounter within last 12 months    Recent Outpatient Visits           2 weeks ago Mild intermittent asthma, unspecified whether complicated   [place] [facility] at [facility], Nyoka Cowden, MD   1 year ago Mild intermittent asthma, unspecified whether complicated   [place] [facility] Medicine at [facility], Nyoka Cowden, MD   1 year ago Mild intermittent asthma, unspecified whether complicated   [place] [facility] Medicine at [facility], Nyoka Cowden, MD   1 year ago Abdominal wall pain   [place] [facility] Medicine at [facility], Nyoka Cowden, MD   1 year ago Mild intermittent asthma, unspecified whether complicated   [place] [facility] Medicine at [facility], Nyoka Cowden, MD       Future Appointments             In 5 months Judithann Graves Nyoka Cowden, MD [facility] Primary Care & Sports Medicine at [facility], [facility]   In 11 months Reubin Milan, MD [facility] Primary Care & Sports Medicine at [facility], [facility]               Requested Prescriptions  Pending Prescriptions Disp Refills   Fluticasone-Umeclidin-Vilant (TRELEGY ELLIPTA) 100-62.5-25 MCG/ACT AEPB 60 each 3    Sig: Inhale 1 Inhalation into the lungs daily.     Off-Protocol Failed - 01/01/2023  4:21  PM      Failed - Medication not assigned to a protocol, review manually.      Passed - Valid encounter within last 12 months    Recent Outpatient Visits           2 weeks ago Mild intermittent asthma, unspecified whether complicated   [place] [facility] at [facility], Nyoka Cowden, MD   1 year ago Mild intermittent asthma, unspecified whether complicated   [place] [facility] at [facility], Nyoka Cowden, MD   1 year ago Mild intermittent asthma, unspecified whether complicated   [place] [facility] Medicine at [facility], Nyoka Cowden, MD   1 year ago Abdominal wall pain    Primary Care & Sports Medicine at [facility], Nyoka Cowden, MD   1 year ago Mild intermittent asthma, unspecified whether complicated   [facility] Primary [facility] & Sports Medicine at [facility], Nyoka Cowden, MD       Future Appointments             In 5 months Judithann Graves, Nyoka Cowden, MD [facility] Primary Care & Sports Medicine at [facility], [facility]   In 11 months Judithann Graves, Nyoka Cowden, MD [facility] Primary Care & Sports Medicine at [facility], [facility]

## 2023-01-20 DIAGNOSIS — M79652 Pain in left thigh: Secondary | ICD-10-CM | POA: Diagnosis not present

## 2023-01-23 DIAGNOSIS — M79652 Pain in left thigh: Secondary | ICD-10-CM | POA: Diagnosis not present

## 2023-01-23 DIAGNOSIS — M85852 Other specified disorders of bone density and structure, left thigh: Secondary | ICD-10-CM | POA: Diagnosis not present

## 2023-01-23 DIAGNOSIS — R600 Localized edema: Secondary | ICD-10-CM | POA: Diagnosis not present

## 2023-01-23 DIAGNOSIS — M2242 Chondromalacia patellae, left knee: Secondary | ICD-10-CM | POA: Diagnosis not present

## 2023-01-23 DIAGNOSIS — S83282A Other tear of lateral meniscus, current injury, left knee, initial encounter: Secondary | ICD-10-CM | POA: Diagnosis not present

## 2023-01-23 DIAGNOSIS — M25562 Pain in left knee: Secondary | ICD-10-CM | POA: Diagnosis not present

## 2023-01-26 DIAGNOSIS — M25562 Pain in left knee: Secondary | ICD-10-CM | POA: Diagnosis not present

## 2023-01-28 ENCOUNTER — Ambulatory Visit
Admission: RE | Admit: 2023-01-28 | Discharge: 2023-01-28 | Disposition: A | Discharge status: Home / Self Care | Payer: Medicare Other | Source: Ambulatory Visit | Attending: Internal Medicine | Admitting: Internal Medicine

## 2023-01-28 DIAGNOSIS — Z1231 Encounter for screening mammogram for malignant neoplasm of breast: Secondary | ICD-10-CM | POA: Diagnosis not present

## 2023-03-05 ENCOUNTER — Other Ambulatory Visit: Payer: Self-pay | Admitting: Internal Medicine

## 2023-03-05 DIAGNOSIS — J4520 Mild intermittent asthma, uncomplicated: Secondary | ICD-10-CM

## 2023-04-03 ENCOUNTER — Other Ambulatory Visit: Payer: Self-pay | Admitting: Internal Medicine

## 2023-04-03 DIAGNOSIS — J4520 Mild intermittent asthma, uncomplicated: Secondary | ICD-10-CM

## 2023-04-03 NOTE — Telephone Encounter (Signed)
Requested Prescriptions  Pending Prescriptions Disp Refills   VENTOLIN HFA 108 (90 Base) MCG/ACT inhaler [Pharmacy Med Name: Ventolin HFA 108 (90 Base) MCG/ACT Inhalation Aerosol Solution] 18 g 2    Sig: INHALE 2 PUFFS BY MOUTH EVERY 6 HOURS AS NEEDED FOR WHEEZING OR SHORTNESS OF BREATH     Pulmonology:  Beta Agonists 2 Passed - 04/03/2023  9:18 AM      Passed - Last BP in normal range    BP Readings from Last 1 Encounters:  12/15/22 124/68         Passed - Last Heart Rate in normal range    Pulse Readings from Last 1 Encounters:  12/15/22 75         Passed - Valid encounter within last 12 months    Recent Outpatient Visits           3 months ago Mild intermittent asthma, unspecified whether complicated   [place] [facility] Medicine at [facility], Nyoka Cowden, MD   1 year ago Mild intermittent asthma, unspecified whether complicated   [place] [facility] & Sports Medicine at [facility], Nyoka Cowden, MD   1 year ago Mild intermittent asthma, unspecified whether complicated   [place] [facility] Medicine at [facility], Nyoka Cowden, MD   1 year ago Abdominal wall pain   [place] [facility] Care & Sports Medicine at [facility], Nyoka Cowden, MD   1 year ago Mild intermittent asthma, unspecified whether complicated   [facility] Primary Care & Sports Medicine at [facility], Nyoka Cowden, MD       Future Appointments             In 2 months Judithann Graves, Nyoka Cowden, MD [facility] Health Primary Care & Sports Medicine at [facility], [facility] [place]   In 8 months Judithann Graves, Nyoka Cowden, MD [facility] Health Primary Care & Sports Medicine at [facility], [facility]

## 2023-04-28 ENCOUNTER — Telehealth: Payer: Self-pay | Admitting: Internal Medicine

## 2023-04-28 NOTE — Telephone Encounter (Signed)
Copied from CRM [id number]. Topic: Medicare AWV >> Apr 28, 2023  3:06 PM Payton Doughty wrote: Reason for CRM: Called LVM 04/28/2023 to schedule Annual Wellness Visit  Verlee Rossetti; Care Guide Ambulatory Clinical Support Connell l [facility] Direct Dial: [phone]

## 2023-05-13 DIAGNOSIS — Z23 Encounter for immunization: Secondary | ICD-10-CM | POA: Diagnosis not present

## 2023-05-14 ENCOUNTER — Ambulatory Visit: Payer: Medicare Other | Admitting: Emergency Medicine

## 2023-05-14 VITALS — Ht 64.0 in | Wt 108.0 lb

## 2023-05-14 DIAGNOSIS — Z0000 Encounter for general adult medical examination without abnormal findings: Secondary | ICD-10-CM | POA: Diagnosis not present

## 2023-05-14 NOTE — Patient Instructions (Addendum)
Ms. Lindsey Golden , Thank you for taking time to come for your Medicare Wellness Visit. I appreciate your ongoing commitment to your health goals. Please review the following plan we discussed and let me know if I can assist you in the future.   Referrals/Orders/Follow-Ups/Clinician Recommendations: Keep up the good work!!  This is a list of the screening recommended for you and due dates:  Health Maintenance  Topic Date Due   COVID-19 Vaccine (6 - 2023-24 season) 02/08/2023   Mammogram  01/28/2024   Medicare Annual Wellness Visit  05/13/2024   DTaP/Tdap/Td vaccine (2 - Td or Tdap) 09/28/2024   Colon Cancer Screening  02/21/2027   Pneumonia Vaccine  Completed   Flu Shot  Completed   DEXA scan (bone density measurement)  Completed   Hepatitis C Screening  Completed   Zoster (Shingles) Vaccine  Completed   HPV Vaccine  Aged Out    Advanced directives: (Copy Requested) Please bring a copy of your health care power of attorney and living will to the office to be added to your chart at your convenience.  Next Medicare Annual Wellness Visit scheduled for next year: Yes, 06/16/24 @ 1:50pm

## 2023-05-14 NOTE — Progress Notes (Signed)
Subjective:   Lindsey Golden is a 77 y.o. female who presents for Medicare Annual (Subsequent) preventive examination.  Visit Complete: Virtual I connected with  Lindsey Golden on 05/14/23 by a audio enabled telemedicine application and verified that I am speaking with the correct person using two identifiers.  Patient Location: Home  Provider Location: Home Office  I discussed the limitations of evaluation and management by telemedicine. The patient expressed understanding and agreed to proceed.  Vital Signs: Because this visit was a virtual/telehealth visit, some criteria may be missing or patient reported. Any vitals not documented were not able to be obtained and vitals that have been documented are patient reported.   Cardiac Risk Factors include: advanced age (>83men, >29 women);dyslipidemia     Objective:    Today's Vitals   05/14/23 1415  Weight: 108 lb (49 kg)  Height: 5\' 4"  (1.626 m)   Body mass index is 18.54 kg/m.     05/14/2023    2:31 PM 05/07/2022    9:50 AM 02/20/2022    8:12 AM 05/06/2021   10:52 AM 02/22/2021    3:09 PM 10/12/2020   10:28 AM 04/25/2020    9:02 AM  Advanced Directives  Does Patient Have a Medical Advance Directive? Yes Yes Yes Yes No No Yes  Type of Estate agent of [place];Living will Healthcare Power of [employer] of [place];Living will Healthcare Power of [place];Living will   Healthcare Power of [place];Living will  Does patient want to make changes to medical advance directive? No - Patient declined  No - Patient declined      Copy of Healthcare Power of Attorney in Chart? No - copy requested No - copy requested Yes - validated most recent copy scanned in chart (See row information) No - copy requested   No - copy requested    Current Medications (verified) Outpatient Encounter Medications as of 05/14/2023  Medication Sig   CALCIUM-VITAMIN D PO Take by mouth daily.    famotidine (PEPCID) 10 MG tablet Take 10 mg by mouth daily.   ferrous sulfate 325 (65 FE) MG tablet Take 325 mg by mouth daily with breakfast.   gabapentin (NEURONTIN) 100 MG capsule Take 1 capsule (100 mg total) by mouth 4 (four) times daily.   levothyroxine (SYNTHROID) 75 MCG tablet TAKE 1 TABLET BY MOUTH ONCE DAILY ON AN EMPTY STOMACH. WAIT 30 MINUTES BEFORE TAKING OTHER MEDS.   Multiple Vitamins-Minerals (MULTIPLE VITAMINS/WOMENS PO) Take by mouth.   TRELEGY ELLIPTA 100-62.5-25 MCG/ACT AEPB USE 1 INHALATION BY MOUTH ONCE  DAILY AT THE SAME TIME EACH DAY   VENTOLIN HFA 108 (90 Base) MCG/ACT inhaler INHALE 2 PUFFS BY MOUTH EVERY 6 HOURS AS NEEDED FOR WHEEZING OR SHORTNESS OF BREATH   Zoledronic Acid (RECLAST IV) Inject into the vein. April   No facility-administered encounter medications on file as of 05/14/2023.    Allergies (verified) Aspirin, Penicillins, and Dairy aid [tilactase]   History: Past Medical History:  Diagnosis Date   Allergy    Asthma    mild intermittent due to allergies.   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 Mechele Collin   COLONOSCOPY N/A 02/20/2022   Procedure: COLONOSCOPY;  Surgeon: Midge Minium, MD;  Location: [facility];  Service: Endoscopy;  Laterality: N/A;   ESOPHAGOGASTRODUODENOSCOPY N/A 02/20/2022   Procedure: ESOPHAGOGASTRODUODENOSCOPY (EGD) WITH BIOPSY;  Surgeon: Midge Minium, MD;  Location: [facility];  Service: Endoscopy;  Laterality: N/A;   KNEE SURGERY     five different surgery   Family History  Problem Relation Age of Onset   Depression Mother    Hearing loss Mother    Mental illness Mother    Miscarriages / [place] Mother    Asthma Father    Kidney disease Father    Stroke Father    Varicose Veins  Father    Breast cancer Neg Hx    Social History   Socioeconomic History   Marital status: Married    Spouse name: Lollie Sails   Number of children: 0   Years of education: Not on file   Highest education level: Some college, no degree  Occupational History   Occupation: retired  Tobacco Use   Smoking status: Every Day    Current packs/day: 0.20    Average packs/day: 0.2 packs/day for 40.0 years (8.0 ttl pk-yrs)    Types: Cigarettes   Smokeless tobacco: Never   Tobacco comments:    4-5 cigarettes daily.  Off and on since age 56  Vaping Use   Vaping status: Never Used  Substance and Sexual Activity   Alcohol use: Yes    Comment: 1 glass of wine 1-2 times per month   Drug use: No   Sexual activity: Not on file  Other Topics Concern   Not on file  Social History Narrative   2 step children   Social Determinants of Health   Financial Resource Strain: Low Risk  (05/14/2023)   Overall Financial Resource Strain (CARDIA)    Difficulty of Paying Living Expenses: Not hard at all  Food Insecurity: No Food Insecurity (05/14/2023)   Hunger Vital Sign    Worried About Running Out of Food in the Last Year: Never true    Ran Out of Food in the Last Year: Never true  Transportation Needs: No Transportation Needs (05/14/2023)   PRAPARE - Administrator, Civil Service (Medical): No    Lack of Transportation (Non-Medical): No  Physical Activity: Insufficiently Active (05/14/2023)   Exercise Vital Sign    Days of Exercise per Week: 4 days    Minutes of Exercise per Session: 10 min  Stress: No Stress Concern Present (05/14/2023)   [employer] of Occupational Health - Occupational Stress Questionnaire    Feeling of Stress : Not at all  Social Connections: Moderately Isolated (05/14/2023)   Social Connection and Isolation Panel [NHANES]    Frequency of Communication with Friends and Family: More than three times a week    Frequency of Social Gatherings with Friends and Family:  Once a week    Attends Religious Services: Never    Database administrator or Organizations: No    Attends Banker Meetings: Never    Marital Status: Married    Tobacco Counseling Ready to quit: Not Answered Counseling given: Not Answered Tobacco comments: 4-5 cigarettes daily.  Off and on since age 89   Clinical Intake:  Pre-visit preparation completed: Yes  Pain : No/denies pain     BMI - recorded: 18.54 Nutritional Status: BMI <19  Underweight Nutritional Risks: None Diabetes: No  How often do you need to have someone help you when you read instructions, pamphlets, or other written materials  from your doctor or pharmacy?: 1 - Never  Interpreter Needed?: No  Information entered by :: Lindsey Golden, CMA   Activities of Daily Living    05/14/2023    2:17 PM  In your present state of health, do you have any difficulty performing the following activities:  Hearing? 0  Vision? 0  Difficulty concentrating or making decisions? 0  Walking or climbing stairs? 0  Dressing or bathing? 0  Doing errands, shopping? 0  Preparing Food and eating ? N  Using the Toilet? N  In the past six months, have you accidently leaked urine? N  Do you have problems with loss of bowel control? N  Managing your Medications? N  Managing your Finances? N  Housekeeping or managing your Housekeeping? N    Patient Care Team: Reubin Milan, MD as PCP - General (Internal Medicine) Tedd Sias Marlana Salvage, MD as Physician Assistant (Endocrinology)  Indicate any recent Medical Services you may have received from other than [facility] providers in the past year (date may be approximate).     Assessment:   This is a routine wellness examination for [place].  Hearing/Vision screen Hearing Screening - Comments:: Denies hearing loss Vision Screening - Comments:: Gets eye exams   Goals Addressed               This Visit's Progress     Patient Stated (pt-stated)        Quit smoking and  get through the grieving process after her brother's recent death      Depression Screen    05/14/2023    2:25 PM 12/15/2022    8:41 AM 05/07/2022    9:50 AM 12/11/2021   10:13 AM 10/16/2021    8:24 AM 07/10/2021   10:31 AM 06/11/2021    1:42 PM  PHQ 2/9 Scores  PHQ - 2 Score 2 0 0 0 0 0 0  PHQ- 9 Score 2 0 0 0 0 0 0    Fall Risk    05/14/2023    2:31 PM 12/15/2022    8:41 AM 05/07/2022    9:51 AM 12/11/2021   10:13 AM 10/16/2021    8:24 AM  Fall Risk   Falls in the past year? 0 0 0 0 0  Number falls in past yr: 0 0 0 0 0  Injury with Fall? 0 0 0 0 0  Risk for fall due to : No Fall Risks No Fall Risks No Fall Risks No Fall Risks No Fall Risks  Follow up Falls prevention discussed Falls evaluation completed Falls evaluation completed Falls evaluation completed Falls evaluation completed    MEDICARE RISK AT HOME: Medicare Risk at Home Any stairs in or around the home?: Yes If so, are there any without handrails?: No Home free of loose throw rugs in walkways, pet beds, electrical cords, etc?: Yes Adequate lighting in your home to reduce risk of falls?: Yes Life alert?: No Use of a cane, walker or w/c?: No Grab bars in the bathroom?: Yes Shower chair or bench in shower?: Yes Elevated toilet seat or a handicapped toilet?: No  TIMED UP AND GO:  Was the test performed?  No    Cognitive Function:        05/14/2023    2:33 PM 05/07/2022    9:52 AM 04/25/2019    8:56 AM 04/21/2018    8:36 AM 04/02/2017    9:12 AM  6CIT Screen  What Year? 0 points 0 points 0 points 0  points 0 points  What month? 0 points 0 points 0 points 0 points 0 points  What time? 0 points 0 points 0 points 0 points 0 points  Count back from 20 0 points 0 points 0 points 0 points 0 points  Months in reverse 0 points 0 points 0 points 0 points 2 points  Repeat phrase 0 points 0 points 0 points 0 points 0 points  Total Score 0 points 0 points 0 points 0 points 2 points    Immunizations Immunization  History  Administered Date(s) Administered   Influenza, High Dose Seasonal PF 04/05/2015, 03/18/2016, 03/06/2017, 03/18/2019, 04/16/2020, 04/04/2021   Influenza-Unspecified 03/24/2019, 04/06/2022   Moderna Covid-19 Vaccine Bivalent Booster 60yrs & up 04/17/2021   PFIZER Comirnaty(Gray Top)Covid-19 Tri-Sucrose Vaccine 07/30/2019, 08/21/2019   PFIZER(Purple Top)SARS-COV-2 Vaccination 03/27/2020, 09/11/2020   PNEUMOCOCCAL CONJUGATE-20 12/11/2021   Pneumococcal Conjugate-13 03/08/2014   Pneumococcal Polysaccharide-23 09/17/2012   Tdap 09/29/2014   Zoster Recombinant(Shingrix) 07/08/2018, 12/14/2018    TDAP status: Up to date  Flu Vaccine status: Up to date  Pneumococcal vaccine status: Up to date  Covid-19 vaccine status: Completed vaccines  Qualifies for Shingles Vaccine? Yes   Zostavax completed No   Shingrix Completed?: Yes  Screening Tests Health Maintenance  Topic Date Due   INFLUENZA VACCINE  01/08/2023   COVID-19 Vaccine (6 - 2023-24 season) 02/08/2023   MAMMOGRAM  01/28/2024   Medicare Annual Wellness (AWV)  05/13/2024   DTaP/Tdap/Td (2 - Td or Tdap) 09/28/2024   Colonoscopy  02/21/2027   Pneumonia Vaccine 51+ Years old  Completed   DEXA SCAN  Completed   Hepatitis C Screening  Completed   Zoster Vaccines- Shingrix  Completed   HPV VACCINES  Aged Out    Health Maintenance  Health Maintenance Due  Topic Date Due   INFLUENZA VACCINE  01/08/2023   COVID-19 Vaccine (6 - 2023-24 season) 02/08/2023    Colorectal cancer screening: No longer required.   Mammogram status: Completed 01/28/23. Repeat every year  Bone Density status: Completed 05/30/19. Results reflect: Bone density results: OSTEOPOROSIS. Repeat every 2 years. Followed by Dr. Carlena Sax endocrinology  Lung Cancer Screening: (Low Dose CT Chest recommended if Age 19-80 years, 20 pack-year currently smoking OR have quit w/in 15years.) does not qualify.   Lung Cancer Screening Referral: n/a  Additional  Screening:  Hepatitis C Screening: does not qualify; Completed 05/08/17  Vision Screening: Recommended annual ophthalmology exams for early detection of glaucoma and other disorders of the eye.  Dental Screening: Recommended annual dental exams for proper oral hygiene   Community Resource Referral / Chronic Care Management: CRR required this visit?  No   CCM required this visit?  No     Plan:     I have personally reviewed and noted the following in the patient's chart:   Medical and social history Use of alcohol, tobacco or illicit drugs  Current medications and supplements including opioid prescriptions. Patient is not currently taking opioid prescriptions. Functional ability and status Nutritional status Physical activity Advanced directives List of other physicians Hospitalizations, surgeries, and ER visits in previous 12 months Vitals Screenings to include cognitive, depression, and falls Referrals and appointments  In addition, I have reviewed and discussed with patient certain preventive protocols, quality metrics, and best practice recommendations. A written personalized care plan for preventive services as well as general preventive health recommendations were provided to patient.     Lindsey Golden, CMA   05/14/2023   After Visit Summary: (MyChart) Due  to this being a telephonic visit, the after visit summary with patients personalized plan was offered to patient via MyChart   Nurse Notes:  Patient sees endocrinology for osteoporosis. Will defer to them for ordering DEXA scan.

## 2023-06-12 ENCOUNTER — Ambulatory Visit (INDEPENDENT_AMBULATORY_CARE_PROVIDER_SITE_OTHER): Payer: Medicare Other | Admitting: Internal Medicine

## 2023-06-12 ENCOUNTER — Encounter: Payer: Self-pay | Admitting: Internal Medicine

## 2023-06-12 ENCOUNTER — Other Ambulatory Visit: Payer: Self-pay | Admitting: Internal Medicine

## 2023-06-12 VITALS — BP 120/70 | HR 88 | Ht 64.0 in | Wt 109.6 lb

## 2023-06-12 DIAGNOSIS — R202 Paresthesia of skin: Secondary | ICD-10-CM

## 2023-06-12 DIAGNOSIS — F4321 Adjustment disorder with depressed mood: Secondary | ICD-10-CM

## 2023-06-12 DIAGNOSIS — R1013 Epigastric pain: Secondary | ICD-10-CM

## 2023-06-12 DIAGNOSIS — E034 Atrophy of thyroid (acquired): Secondary | ICD-10-CM | POA: Diagnosis not present

## 2023-06-12 DIAGNOSIS — J4520 Mild intermittent asthma, uncomplicated: Secondary | ICD-10-CM

## 2023-06-12 MED ORDER — GABAPENTIN 100 MG PO CAPS: 100.0000 mg | ORAL_CAPSULE | Freq: Four times a day (QID) | ORAL | 1 refills | Status: DC

## 2023-06-12 MED ORDER — OMEPRAZOLE 20 MG PO CPDR: 20.0000 mg | DELAYED_RELEASE_CAPSULE | Freq: Every day | ORAL | 3 refills | Status: DC

## 2023-06-12 NOTE — Progress Notes (Signed)
 Date:  06/12/2023   Name:  Lindsey Golden   DOB:  [birth year]   MRN:  [id number]   Chief Complaint: Hypothyroidism  Thyroid  Problem Presents for follow-up visit. Symptoms include anxiety. Patient reports no diarrhea. The symptoms have been stable.  Depression        This is a new problem.  The current episode started more than 1 month ago.   The problem has been gradually worsening since onset.  Associated symptoms include sad.  Associated symptoms include no decreased concentration, no appetite change and no suicidal ideas.     The symptoms are aggravated by family issues (lost a sister, brother and cousin recently).  Past medical history includes thyroid  problem.   Abdominal Pain This is a new problem. The current episode started more than 1 month ago. The onset quality is undetermined. The problem has been unchanged. The pain is located in the epigastric region. The pain is mild. The quality of the pain is cramping. Pertinent negatives include no diarrhea, fever or vomiting. Nothing aggravates the pain.    Review of Systems  Constitutional:  Negative for appetite change, chills and fever.  HENT:  Negative for trouble swallowing.   Respiratory:  Negative for chest tightness, shortness of breath and wheezing.   Cardiovascular:  Negative for chest pain.  Gastrointestinal:  Positive for abdominal pain. Negative for diarrhea and vomiting.  Psychiatric/Behavioral:  Positive for depression and dysphoric mood. Negative for decreased concentration, sleep disturbance and suicidal ideas. The patient is nervous/anxious.      Lab Results  Component Value Date   NA 140 12/15/2022   K 4.5 12/15/2022   CO2 24 12/15/2022   GLUCOSE 105 (H) 12/15/2022   BUN 15 12/15/2022   CREATININE 0.75 12/15/2022   CALCIUM 9.9 12/15/2022   EGFR 83 12/15/2022   GFRNONAA 76 06/08/2019   Lab Results  Component Value Date   CHOL 208 (H) 12/15/2022   HDL 91 12/15/2022   LDLCALC 100 (H) 12/15/2022    TRIG 96 12/15/2022   CHOLHDL 2.3 12/15/2022   Lab Results  Component Value Date   TSH 0.698 12/15/2022   Lab Results  Component Value Date   HGBA1C 5.0 09/02/2016   Lab Results  Component Value Date   WBC 10.7 12/15/2022   HGB 13.2 12/15/2022   HCT 39.6 12/15/2022   MCV 100 (H) 12/15/2022   PLT 275 12/15/2022   Lab Results  Component Value Date   ALT 11 12/15/2022   AST 25 12/15/2022   ALKPHOS 73 12/15/2022   BILITOT 0.3 12/15/2022   Lab Results  Component Value Date   VD25OH 56 11/29/2019     Patient Active Problem List   Diagnosis Date Noted   Benign essential tremor 12/15/2022   History of colonic polyps    Gastritis without bleeding    Mild intermittent asthma 07/10/2021   Lower leg edema 12/17/2020   Mixed hyperlipidemia 12/17/2020   History of Helicobacter pylori infection 07/01/2019   GERD without esophagitis 06/08/2019   Age-related osteoporosis without current pathological fracture 06/08/2019   Hypothyroidism due to acquired atrophy of thyroid  11/02/2018   Vitamin D  deficiency 02/16/2018   Tingling in extremities 02/16/2018   Myalgia 02/02/2018   Adenomatous colon polyp 10/07/2017   AVM (arteriovenous malformation) of small bowel, 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 Viktoria   COLONOSCOPY N/A 02/20/2022   Procedure: COLONOSCOPY;  Surgeon: Jinny Carmine, MD;  Location: [facility];  Service: Endoscopy;  Laterality: N/A;   ESOPHAGOGASTRODUODENOSCOPY N/A 02/20/2022   Procedure: ESOPHAGOGASTRODUODENOSCOPY (EGD) WITH  BIOPSY;  Surgeon: Jinny Carmine, MD;  Location: [facility];  Service: Endoscopy;  Laterality: N/A;   KNEE SURGERY     five different surgery    Social History   Tobacco Use   Smoking status: Every Day    Current packs/day: 0.25    Average packs/day: 0.3 packs/day for 57.0 years (14.3 ttl pk-yrs)    Types: Cigarettes    Start date: 1968   Smokeless tobacco: Never   Tobacco comments:    4-5 cigarettes daily.  Off and on since age 38  Vaping Use   Vaping status: Never Used  Substance Use Topics   Alcohol use: Yes    Comment: 1 glass of wine 1-2 times per month   Drug use: No     Medication list has been reviewed and updated.  Current Meds  Medication Sig   CALCIUM-VITAMIN D  PO Take by mouth daily.   famotidine (PEPCID) 10 MG tablet Take 10 mg by mouth daily.   ferrous sulfate 325 (65 FE) MG tablet Take 325 mg by mouth daily with breakfast.   gabapentin  (NEURONTIN ) 100 MG capsule Take 1 capsule (100 mg total) by mouth 4 (four) times daily.   levothyroxine  (SYNTHROID ) 75 MCG tablet TAKE 1 TABLET BY MOUTH ONCE DAILY ON AN EMPTY STOMACH. WAIT 30 MINUTES BEFORE TAKING OTHER MEDS.   Multiple Vitamins-Minerals (MULTIPLE VITAMINS/WOMENS PO) Take by mouth.   omeprazole  (PRILOSEC) 20 MG capsule Take 1 capsule (20 mg total) by mouth daily.   TRELEGY ELLIPTA  100-62.5-25 MCG/ACT AEPB USE 1 INHALATION BY MOUTH ONCE  DAILY AT THE SAME TIME EACH DAY   VENTOLIN  HFA 108 (90 Base) MCG/ACT inhaler INHALE 2 PUFFS BY MOUTH EVERY 6 HOURS AS NEEDED FOR WHEEZING OR SHORTNESS OF BREATH   Zoledronic  Acid (RECLAST  IV) Inject into the vein. April       06/12/2023    8:08 AM 12/15/2022    8:41 AM 12/11/2021   10:13 AM 10/16/2021    8:24 AM  GAD 7 : Generalized Anxiety Score  Nervous, Anxious, on Edge 2 0 0 0  Control/stop worrying 2 0 0 0  Worry too much - different things 2 0 0 0  Trouble relaxing 0 0 0 0  Restless 0 0 0 0  Easily annoyed or irritable 0 0 0 0  Afraid - awful might happen 3 0 0  0  Total GAD 7 Score 9 0 0 0  Anxiety Difficulty Very difficult Not difficult at all Not difficult at all Not difficult at all       06/12/2023    8:07 AM 05/14/2023    2:25 PM 12/15/2022    8:41 AM  Depression screen PHQ 2/9  Decreased Interest 3 1 0  Down, Depressed, Hopeless 3 1 0  PHQ - 2 Score 6 2 0  Altered sleeping 0 0 0  Tired, decreased energy 0 0 0  Change in appetite 1  0 0  Feeling bad or failure about yourself  0 0 0  Trouble concentrating 0 0 0  Moving slowly or fidgety/restless 0 0 0  Suicidal thoughts 0 0 0  PHQ-9 Score 7 2 0  Difficult doing work/chores Somewhat difficult Somewhat difficult Not difficult at all    BP Readings from Last 3 Encounters:  06/12/23 120/70  12/15/22 124/68  06/19/22 (!) 142/86    Physical Exam Vitals and nursing note reviewed.  Constitutional:      General: She is not in acute distress.    Appearance: Normal appearance. She is well-developed.  HENT:     Head: Normocephalic and atraumatic.  Cardiovascular:     Rate and Rhythm: Normal rate and regular rhythm.  Pulmonary:     Effort: Pulmonary effort is normal. No respiratory distress.     Breath sounds: No wheezing or rhonchi.  Abdominal:     General: Abdomen is flat.     Palpations: Abdomen is soft. There is no mass.     Tenderness: There is no abdominal tenderness.  Skin:    General: Skin is warm and dry.     Findings: No rash.  Neurological:     Mental Status: She is alert and oriented to person, place, and time.  Psychiatric:        Attention and Perception: Attention normal.        Mood and Affect: Mood normal. Affect is tearful.        Behavior: Behavior normal.        Thought Content: Thought content does not include suicidal ideation.        Cognition and Memory: Cognition normal.     Wt Readings from Last 3 Encounters:  06/12/23 109 lb 9.6 oz (49.7 kg)  05/14/23 108 lb (49 kg)  12/15/22 113 lb 3.2 oz (51.3 kg)    BP 120/70   Pulse 88   Ht 5' 4 (1.626 m)    Wt 109 lb 9.6 oz (49.7 kg)   SpO2 95%   BMI 18.81 kg/m   Assessment and Plan:  Problem List Items Addressed This Visit       Unprioritized   Tingling in extremities   Relevant Medications   gabapentin  (NEURONTIN ) 100 MG capsule   Hypothyroidism due to acquired atrophy of thyroid  - Primary (Chronic)   Thyroid  supplemented. Lab Results  Component Value Date   TSH 0.698 12/15/2022         Relevant Orders   TSH + free T4   Other Visit Diagnoses       Grief reaction       has lost a close cousin, brother and sister in the past 3 months does not want meds at this time; will consider counseling     Dyspepsia       likely excess acid due to recent stress will stop Pepcid and start Omeprazole    Relevant Medications   omeprazole  (PRILOSEC) 20 MG capsule       No follow-ups on file.    Leita HILARIO Adie, MD [facility] [place]

## 2023-06-12 NOTE — Assessment & Plan Note (Signed)
 Thyroid supplemented. Lab Results  Component Value Date   TSH 0.698 12/15/2022

## 2023-06-13 LAB — TSH+FREE T4
Free T4: 1.44 ng/dL (ref 0.82–1.77)
TSH: 0.736 u[IU]/mL (ref 0.450–4.500)

## 2023-06-15 NOTE — Telephone Encounter (Signed)
 Requested Prescriptions  Pending Prescriptions Disp Refills   VENTOLIN  HFA 108 (90 Base) MCG/ACT inhaler [Pharmacy Med Name: Ventolin  HFA 108 (90 Base) MCG/ACT Inhalation Aerosol Solution] 18 g 3    Sig: INHALE 2 PUFFS BY MOUTH EVERY 6 HOURS AS NEEDED FOR WHEEZING FOR SHORTNESS OF BREATH     Pulmonology:  [facility] 2 Passed - 06/15/2023  5:46 PM      Passed - Last BP in normal range    BP Readings from Last 1 Encounters:  06/12/23 120/70         Passed - Last Heart Rate in normal range    Pulse Readings from Last 1 Encounters:  06/12/23 88         Passed - Valid encounter within last 12 months    Recent Outpatient Visits           3 days ago Hypothyroidism due to acquired atrophy of thyroid    [place] [facility] at [facility], Leita DEL, MD   6 months ago Mild intermittent asthma, unspecified whether complicated   [place] [facility] at [facility], Leita DEL, MD   1 year ago Mild intermittent asthma, unspecified whether complicated   [place] [facility] at [facility], Leita DEL, MD   1 year ago Mild intermittent asthma, unspecified whether complicated   [facility] at [facility], Leita DEL, MD   1 year ago Abdominal wall pain   [place] [facility] at [facility], Leita DEL, MD       Future Appointments             In 6 months Justus, Leita DEL, MD [facility] at [facility], [facility]

## 2023-07-04 ENCOUNTER — Other Ambulatory Visit: Payer: Self-pay | Admitting: Internal Medicine

## 2023-07-04 DIAGNOSIS — J4520 Mild intermittent asthma, uncomplicated: Secondary | ICD-10-CM

## 2023-07-06 ENCOUNTER — Other Ambulatory Visit: Payer: Self-pay | Admitting: Internal Medicine

## 2023-07-06 DIAGNOSIS — J4520 Mild intermittent asthma, uncomplicated: Secondary | ICD-10-CM

## 2023-07-06 NOTE — Telephone Encounter (Signed)
[facility] pharmacy, staff state that a refill request was sent to office because patient dose not have any refills left. Refilled needed.

## 2023-07-06 NOTE — Telephone Encounter (Signed)
Requested medication (s) are due for refill today: yes  Requested medication (s) are on the active medication list: yes  Last refill:  03/05/23  Future visit scheduled: yes  Notes to clinic:  Medication not assigned to a protocol, review manually.      Requested Prescriptions  Pending Prescriptions Disp Refills   Fluticasone-Umeclidin-Vilant (TRELEGY ELLIPTA) 100-62.5-25 MCG/ACT AEPB 180 each 3     Off-Protocol Failed - 07/06/2023 11:55 AM      Failed - Medication not assigned to a protocol, review manually.      Passed - Valid encounter within last 12 months    Recent Outpatient Visits           3 weeks ago Hypothyroidism due to acquired atrophy of thyroid   [place] [facility] at [facility], Nyoka Cowden, MD   6 months ago Mild intermittent asthma, unspecified whether complicated   [place] [facility] at [facility], Nyoka Cowden, MD   1 year ago Mild intermittent asthma, unspecified whether complicated   [place] [facility] at [facility], Nyoka Cowden, MD   1 year ago Mild intermittent asthma, unspecified whether complicated   [facility] at [facility], Nyoka Cowden, MD   1 year ago Abdominal wall pain   [place] [facility] at [facility], Nyoka Cowden, MD       Future Appointments             In 5 months Judithann Graves, Nyoka Cowden, MD [facility] Primary Care & Sports Medicine at [facility], [facility]

## 2023-07-06 NOTE — Telephone Encounter (Signed)
Requested medication (s) are due for refill today: yes  Requested medication (s) are on the active medication list: yes  Last refill:  03/03/23  Future visit scheduled: yes  Notes to clinic:  Medication not assigned to a protocol, review manually. Patient wants refills to go to [facility] pharmacy. Called [facility], no refills available. Please advise     Requested Prescriptions  Pending Prescriptions Disp Refills   TRELEGY ELLIPTA 100-62.5-25 MCG/ACT AEPB [Pharmacy Med Name: Trelegy Ellipta 100-62.5-25 MCG/INH Inhalation Aerosol Powder Breath Activated] 180 each 0    Sig: INHALE 1 PUFF ONCE DAILY RINSE MOUTH AFTER USE     Off-Protocol Failed - 07/06/2023 11:53 AM      Failed - Medication not assigned to a protocol, review manually.      Passed - Valid encounter within last 12 months    Recent Outpatient Visits           3 weeks ago Hypothyroidism due to acquired atrophy of thyroid   [place] [facility] at [facility], Nyoka Cowden, MD   6 months ago Mild intermittent asthma, unspecified whether complicated   [place] [facility] at [facility], Nyoka Cowden, MD   1 year ago Mild intermittent asthma, unspecified whether complicated   [place] [facility] at [facility], Nyoka Cowden, MD   1 year ago Mild intermittent asthma, unspecified whether complicated   [facility] Medicine at [facility], Nyoka Cowden, MD   1 year ago Abdominal wall pain   [place] [facility] at [facility] Nyoka Cowden, MD       Future Appointments             In 5 months Judithann Graves, Nyoka Cowden, MD [facility] & Sports Medicine at [facility], [facility]

## 2023-07-06 NOTE — Telephone Encounter (Signed)
Medication Refill -  Most Recent Primary Care Visit:  Provider: Reubin Milan  Department: [facility]  Visit Type: OFFICE VISIT  Date: 06/12/2023  Medication: TRELEGY ELLIPTA 100-62.5-25 MCG/ACT AEPB . Caller states she is out and would like this request expedited. She states she was seen by PCP and she was advise at last OV PCP would send in prescription refills.   Has the patient contacted their pharmacy? Yes   Is this the correct pharmacy for this prescription? Yes If no, delete pharmacy and type the correct one.  This is the patient's preferred pharmacy:  [facility] [address] [place] - [address] [place] [address] Phone: [phone] Fax: [phone]     Has the prescription been filled recently? Yes  Is the patient out of the medication? Yes  Has the patient been seen for an appointment in the last year OR does the patient have an upcoming appointment? Yes  Can we respond through MyChart? Yes  Agent: Please be advised that Rx refills may take up to 3 business days. We ask that you follow-up with your pharmacy.

## 2023-08-18 DIAGNOSIS — M545 Low back pain, unspecified: Secondary | ICD-10-CM | POA: Diagnosis not present

## 2023-09-12 ENCOUNTER — Other Ambulatory Visit: Payer: Self-pay | Admitting: Internal Medicine

## 2023-09-12 DIAGNOSIS — J4520 Mild intermittent asthma, uncomplicated: Secondary | ICD-10-CM

## 2023-09-14 NOTE — Telephone Encounter (Signed)
 Requested medications are due for refill today.  yes  Requested medications are on the active medications list.  yes  Last refill. 07/06/2023 180 0 rf  Future visit scheduled.   yes  Notes to clinic.  Medication not assigned to a protocol . Please review for refill.    Requested Prescriptions  Pending Prescriptions Disp Refills   TRELEGY ELLIPTA 100-62.5-25 MCG/ACT AEPB [Pharmacy Med Name: Trelegy Ellipta 100-62.5-25 MCG/INH Inhalation Aerosol Powder Breath Activated] 180 each 0    Sig: INHALE 1 PUFF INTO LUNGS ONCE DAILY,  RINSE MOUTH AFTER USE     Off-Protocol Failed - 09/14/2023  3:20 PM      Failed - Medication not assigned to a protocol, review manually.      Failed - Valid encounter within last 12 months    Recent Outpatient Visits   None     Future Appointments             In 3 months Judithann Graves, Nyoka Cowden, MD [facility] Health Primary Care & Sports Medicine at [facility], [facility]

## 2023-09-15 DIAGNOSIS — M810 Age-related osteoporosis without current pathological fracture: Secondary | ICD-10-CM | POA: Diagnosis not present

## 2023-09-17 ENCOUNTER — Other Ambulatory Visit: Payer: Self-pay | Admitting: Internal Medicine

## 2023-09-17 DIAGNOSIS — J4520 Mild intermittent asthma, uncomplicated: Secondary | ICD-10-CM

## 2023-09-17 NOTE — Telephone Encounter (Signed)
 Requested Prescriptions  Pending Prescriptions Disp Refills   VENTOLIN HFA 108 (90 Base) MCG/ACT inhaler [Pharmacy Med Name: Ventolin HFA 108 (90 Base) MCG/ACT Inhalation Aerosol Solution] 18 g 2    Sig: INHALE 2 PUFFS BY MOUTH EVERY 6 HOURS AS NEEDED FOR WHEEZING FOR SHORTNESS OF BREATH     Pulmonology:  Beta Agonists 2 Failed - 09/17/2023  3:52 PM      Failed - Valid encounter within last 12 months    Recent Outpatient Visits   None     Future Appointments             In 3 months Reubin Milan, MD [facility] Primary Care & Sports Medicine at [facility], [facility]            Passed - Last BP in normal range    BP Readings from Last 1 Encounters:  06/12/23 120/70         Passed - Last Heart Rate in normal range    Pulse Readings from Last 1 Encounters:  06/12/23 88

## 2023-09-22 DIAGNOSIS — E039 Hypothyroidism, unspecified: Secondary | ICD-10-CM | POA: Diagnosis not present

## 2023-09-22 DIAGNOSIS — M810 Age-related osteoporosis without current pathological fracture: Secondary | ICD-10-CM | POA: Diagnosis not present

## 2023-09-22 DIAGNOSIS — F17210 Nicotine dependence, cigarettes, uncomplicated: Secondary | ICD-10-CM | POA: Diagnosis not present

## 2023-10-08 ENCOUNTER — Other Ambulatory Visit: Payer: Self-pay | Admitting: Internal Medicine

## 2023-10-08 DIAGNOSIS — R1013 Epigastric pain: Secondary | ICD-10-CM

## 2023-10-11 NOTE — Telephone Encounter (Signed)
 Requested Prescriptions  Pending Prescriptions Disp Refills   omeprazole  (PRILOSEC) 20 MG capsule [Pharmacy Med Name: Omeprazole  20 MG Oral Capsule Delayed Release] 30 capsule 1    Sig: Take 1 capsule by mouth once daily     Gastroenterology: Proton Pump Inhibitors Failed - 10/11/2023 12:33 PM      Failed - Valid encounter within last 12 months    Recent Outpatient Visits   None     Future Appointments             In 2 months Gala Jubilee, Chales [place], MD [facility] Primary Care & Sports Medicine at [facility]: [facility], [facility]

## 2023-10-21 DIAGNOSIS — M810 Age-related osteoporosis without current pathological fracture: Secondary | ICD-10-CM | POA: Diagnosis not present

## 2023-11-19 ENCOUNTER — Other Ambulatory Visit: Payer: Self-pay | Admitting: Internal Medicine

## 2023-11-19 DIAGNOSIS — J4520 Mild intermittent asthma, uncomplicated: Secondary | ICD-10-CM

## 2023-11-20 NOTE — Telephone Encounter (Signed)
 Requested by interface sure scripts. Last OV 06/12/23 future visit in 3 weeks.  Requested Prescriptions  Pending Prescriptions Disp Refills   VENTOLIN  HFA 108 (90 Base) MCG/ACT inhaler [Pharmacy Med Name: Ventolin  HFA 108 (90 Base) MCG/ACT Inhalation Aerosol Solution] 18 g 0    Sig: INHALE 2 PUFFS BY MOUTH EVERY 6 HOURS AS NEEDED FOR WHEEZING OR SHORTNESS OF BREATH     Pulmonology:  Beta Agonists 2 Failed - 11/20/2023  3:35 PM      Failed - Valid encounter within last 12 months    Recent Outpatient Visits   None     Future Appointments             In 3 weeks Sheron Dixons, MD [facility] Health Primary Care & Sports Medicine at [facility], [facility]            Passed - Last BP in normal range    BP Readings from Last 1 Encounters:  06/12/23 120/70         Passed - Last Heart Rate in normal range    Pulse Readings from Last 1 Encounters:  06/12/23 88

## 2023-12-04 ENCOUNTER — Other Ambulatory Visit: Payer: Self-pay | Admitting: Internal Medicine

## 2023-12-04 DIAGNOSIS — R1013 Epigastric pain: Secondary | ICD-10-CM

## 2023-12-07 ENCOUNTER — Other Ambulatory Visit: Payer: Self-pay | Admitting: Internal Medicine

## 2023-12-07 DIAGNOSIS — E034 Atrophy of thyroid (acquired): Secondary | ICD-10-CM

## 2023-12-07 NOTE — Telephone Encounter (Signed)
 Requested Prescriptions  Pending Prescriptions Disp Refills   omeprazole  (PRILOSEC) 20 MG capsule [Pharmacy Med Name: Omeprazole  20 MG Oral Capsule Delayed Release] 30 capsule 1    Sig: Take 1 capsule by mouth once daily     Gastroenterology: Proton Pump Inhibitors Failed - 12/07/2023 10:38 AM      Failed - Valid encounter within last 12 months    Recent Outpatient Visits   None     Future Appointments             In 1 week Lindsey Golden, Lindsey Golden DEL, MD [facility] Primary Care & Sports Medicine at [facility], [facility]

## 2023-12-08 NOTE — Telephone Encounter (Signed)
 Requested Prescriptions  Pending Prescriptions Disp Refills   levothyroxine  (SYNTHROID ) 75 MCG tablet [Pharmacy Med Name: Levothyroxine  Sodium 75 MCG Oral Tablet] 90 tablet 0    Sig: TAKE 1 TABLET BY MOUTH ONCE DAILY ON AN EMPTY STOMACH. WAIT 30 MINUTES BEFORE TAKING OTHER MEDS     Endocrinology:  Hypothyroid Agents Failed - 12/08/2023  4:05 PM      Failed - Valid encounter within last 12 months    Recent Outpatient Visits   None     Future Appointments             In 1 week Justus Leita DEL, MD [facility] Primary Care & Sports Medicine at [facility], [facility]            Passed - TSH in normal range and within 360 days    TSH  Date Value Ref Range Status  06/12/2023 0.736 0.450 - 4.500 uIU/mL Final

## 2023-12-12 ENCOUNTER — Other Ambulatory Visit: Payer: Self-pay | Admitting: Internal Medicine

## 2023-12-12 DIAGNOSIS — R1013 Epigastric pain: Secondary | ICD-10-CM

## 2023-12-12 DIAGNOSIS — J4520 Mild intermittent asthma, uncomplicated: Secondary | ICD-10-CM

## 2023-12-15 NOTE — Telephone Encounter (Signed)
 Requested Prescriptions  Pending Prescriptions Disp Refills   TRELEGY ELLIPTA  100-62.5-25 MCG/ACT AEPB [Pharmacy Med Name: Trelegy Ellipta  100-62.5-25 MCG/INH Inhalation Aerosol Powder Breath Activated] 180 each     Sig: INHALE 1 PUFF INTO LUNGS ONCE DAILY RINSE MOUTH AFTER USE     Off-Protocol Failed - 12/15/2023  1:33 PM      Failed - Medication not assigned to a protocol, review manually.      Failed - Valid encounter within last 12 months    Recent Outpatient Visits   None     Future Appointments             In 2 days Justus Leita DEL, MD [facility] Primary Care & Sports Medicine at [facility], [facility]             omeprazole  (PRILOSEC) 20 MG capsule [Pharmacy Med Name: Omeprazole  20 MG Oral Capsule Delayed Release] 60 capsule     Sig: Take 1 capsule by mouth once daily     Gastroenterology: Proton Pump Inhibitors Failed - 12/15/2023  1:33 PM      Failed - Valid encounter within last 12 months    Recent Outpatient Visits   None     Future Appointments             In 2 days Justus Leita DEL, MD [facility] Health Primary Care & Sports Medicine at [facility], [facility]            Signed Prescriptions Disp Refills   VENTOLIN  HFA 108 (90 Base) MCG/ACT inhaler 18 g 0    Sig: INHALE 2 PUFFS BY MOUTH EVERY 6 HOURS AS NEEDED FOR WHEEZING FOR SHORTNESS OF BREATH     Pulmonology:  Beta Agonists 2 Failed - 12/15/2023  1:33 PM      Failed - Valid encounter within last 12 months    Recent Outpatient Visits   None     Future Appointments             In 2 days Justus Leita DEL, MD [facility] Primary Care & Sports Medicine at [facility], [facility]            Passed - Last BP in normal range    BP Readings from Last 1 Encounters:  06/12/23 120/70         Passed - Last Heart Rate in normal range    Pulse Readings from Last 1 Encounters:  06/12/23 88

## 2023-12-15 NOTE — Telephone Encounter (Signed)
 Requested Prescriptions  Pending Prescriptions Disp Refills   Fluticasone -Umeclidin-Vilant (TRELEGY ELLIPTA ) 100-62.5-25 MCG/ACT AEPB [Pharmacy Med Name: Trelegy Ellipta  100-62.5-25 MCG/INH Inhalation Aerosol Powder Breath Activated] 180 each     Sig: INHALE 1 PUFF INTO LUNGS ONCE DAILY RINSE MOUTH AFTER USE     Off-Protocol Failed - 12/15/2023  1:31 PM      Failed - Medication not assigned to a protocol, review manually.      Failed - Valid encounter within last 12 months    Recent Outpatient Visits   None     Future Appointments             In 2 days Justus, Leita DEL, MD [facility] Health Primary Care & Sports Medicine at [facility], [facility]             VENTOLIN  HFA [phone] [id number] Base) MCG/ACT inhaler [Pharmacy Med Name: Ventolin  HFA 108 (90 Base) MCG/ACT Inhalation Aerosol Solution] 18 g 0    Sig: INHALE 2 PUFFS BY MOUTH EVERY 6 HOURS AS NEEDED FOR WHEEZING FOR SHORTNESS OF BREATH     Pulmonology:  Beta Agonists 2 Failed - 12/15/2023  1:31 PM      Failed - Valid encounter within last 12 months    Recent Outpatient Visits   None     Future Appointments             In 2 days Justus Leita DEL, MD [facility] Health Primary Care & Sports Medicine at [facility], [facility]            Passed - Last BP in normal range    BP Readings from Last 1 Encounters:  06/12/23 120/70         Passed - Last Heart Rate in normal range    Pulse Readings from Last 1 Encounters:  06/12/23 88          omeprazole  (PRILOSEC) 20 MG capsule [Pharmacy Med Name: Omeprazole  20 MG Oral Capsule Delayed Release] 60 capsule     Sig: Take 1 capsule by mouth once daily     Gastroenterology: Proton Pump Inhibitors Failed - 12/15/2023  1:31 PM      Failed - Valid encounter within last 12 months    Recent Outpatient Visits   None     Future Appointments             In 2 days Justus Leita DEL, MD [facility] Health Primary Care & Sports Medicine at [facility], [facility]

## 2023-12-15 NOTE — Telephone Encounter (Signed)
 Requested medication (s) are due for refill today: yes  Requested medication (s) are on the active medication list: yes  Last refill:  09/14/23 180  Future visit scheduled: yes  Notes to clinic:  med not assigned to a protocol   Requested Prescriptions  Pending Prescriptions Disp Refills   TRELEGY ELLIPTA  100-62.5-25 MCG/ACT AEPB [Pharmacy Med Name: Trelegy Ellipta  100-62.5-25 MCG/INH Inhalation Aerosol Powder Breath Activated] 180 each     Sig: INHALE 1 PUFF INTO LUNGS ONCE DAILY RINSE MOUTH AFTER USE     Off-Protocol Failed - 12/15/2023  1:34 PM      Failed - Medication not assigned to a protocol, review manually.      Failed - Valid encounter within last 12 months    Recent Outpatient Visits   None     Future Appointments             In 2 days Justus, Leita DEL, MD [facility] Health Primary Care & Sports Medicine at [facility], [facility]            Signed Prescriptions Disp Refills   VENTOLIN  HFA 108 (90 Base) MCG/ACT inhaler 18 g 0    Sig: INHALE 2 PUFFS BY MOUTH EVERY 6 HOURS AS NEEDED FOR WHEEZING FOR SHORTNESS OF BREATH     Pulmonology:  Beta Agonists 2 Failed - 12/15/2023  1:34 PM      Failed - Valid encounter within last 12 months    Recent Outpatient Visits   None     Future Appointments             In 2 days Justus Leita DEL, MD [facility] Health Primary Care & Sports Medicine at [facility], [facility]            Passed - Last BP in normal range    BP Readings from Last 1 Encounters:  06/12/23 120/70         Passed - Last Heart Rate in normal range    Pulse Readings from Last 1 Encounters:  06/12/23 88         Refused Prescriptions Disp Refills   omeprazole  (PRILOSEC) 20 MG capsule [Pharmacy Med Name: Omeprazole  20 MG Oral Capsule Delayed Release] 60 capsule     Sig: Take 1 capsule by mouth once daily     Gastroenterology: Proton Pump Inhibitors Failed - 12/15/2023  1:34 PM      Failed - Valid encounter within last 12 months    Recent Outpatient Visits    None     Future Appointments             In 2 days Justus Leita DEL, MD [facility] Health Primary Care & Sports Medicine at [facility], [facility]

## 2023-12-17 ENCOUNTER — Encounter: Payer: Self-pay | Admitting: Internal Medicine

## 2023-12-17 ENCOUNTER — Ambulatory Visit (INDEPENDENT_AMBULATORY_CARE_PROVIDER_SITE_OTHER): Payer: Self-pay | Admitting: Internal Medicine

## 2023-12-17 VITALS — BP 112/68 | HR 99 | Ht 64.0 in | Wt 111.0 lb

## 2023-12-17 DIAGNOSIS — E782 Mixed hyperlipidemia: Secondary | ICD-10-CM

## 2023-12-17 DIAGNOSIS — R202 Paresthesia of skin: Secondary | ICD-10-CM

## 2023-12-17 DIAGNOSIS — Z1231 Encounter for screening mammogram for malignant neoplasm of breast: Secondary | ICD-10-CM

## 2023-12-17 DIAGNOSIS — G250 Essential tremor: Secondary | ICD-10-CM

## 2023-12-17 DIAGNOSIS — K219 Gastro-esophageal reflux disease without esophagitis: Secondary | ICD-10-CM | POA: Diagnosis not present

## 2023-12-17 DIAGNOSIS — E034 Atrophy of thyroid (acquired): Secondary | ICD-10-CM

## 2023-12-17 DIAGNOSIS — J4520 Mild intermittent asthma, uncomplicated: Secondary | ICD-10-CM | POA: Diagnosis not present

## 2023-12-17 DIAGNOSIS — E559 Vitamin D deficiency, unspecified: Secondary | ICD-10-CM | POA: Diagnosis not present

## 2023-12-17 DIAGNOSIS — M810 Age-related osteoporosis without current pathological fracture: Secondary | ICD-10-CM | POA: Diagnosis not present

## 2023-12-17 MED ORDER — GABAPENTIN 100 MG PO CAPS: 100.0000 mg | ORAL_CAPSULE | Freq: Four times a day (QID) | ORAL | 1 refills | Status: DC

## 2023-12-17 NOTE — Assessment & Plan Note (Addendum)
 Reflux symptoms are controlled on Pepcid daily with PRN omeprazole . Concerned about gastric polyps with chronic PPI use. Patient denies red flag symptoms - no melena, weight loss, dysphagia. Continue daily Pepcid and use omeprazole  prn.

## 2023-12-17 NOTE — Assessment & Plan Note (Signed)
 Tremor is unchanged - some days are worse than others. At this time she is not interested in Neurology consult She can call if this changes.

## 2023-12-17 NOTE — Progress Notes (Signed)
 Date:  12/17/2023   Name:  Calypso Hagarty   DOB:  31-May-1946   MRN:  [id number]   Chief Complaint: Annual Exam Anvi Mangal Leibensperger is a 78 y.o. female who presents today for her Complete Annual Exam. She feels well. She reports exercising. She reports she is sleeping well. Breast complaints - none.  Health Maintenance  Topic Date Due   COVID-19 Vaccine (7 - 2024-25 season) 11/11/2023   Flu Shot  01/08/2024   Mammogram  01/28/2024   Medicare Annual Wellness Visit  05/13/2024   DTaP/Tdap/Td vaccine (2 - Td or Tdap) 09/28/2024   Colon Cancer Screening  02/21/2027   Pneumococcal Vaccine for age over 51  Completed   DEXA scan (bone density measurement)  Completed   Hepatitis C Screening  Completed   Zoster (Shingles) Vaccine  Completed   Hepatitis B Vaccine  Aged Out   HPV Vaccine  Aged Out   Meningitis B Vaccine  Aged Out    Gastroesophageal Reflux She reports no abdominal pain, no chest pain, no coughing or no wheezing. Pertinent negatives include no fatigue.  Thyroid  Problem Patient reports no constipation, diarrhea, fatigue or palpitations. Her past medical history is significant for hyperlipidemia.  Hyperlipidemia This is a chronic problem. Recent lipid tests were reviewed and are normal. Pertinent negatives include no chest pain, myalgias or shortness of breath.  Osteoporosis - followed by Endo, last DEXA 2023, on Zoledronic  Acid - last infusion in May.   Review of Systems  Constitutional:  Negative for fatigue and unexpected weight change.  HENT:  Negative for trouble swallowing.   Eyes:  Negative for visual disturbance.  Respiratory:  Negative for cough, chest tightness, shortness of breath and wheezing.   Cardiovascular:  Negative for chest pain, palpitations and leg swelling.  Gastrointestinal:  Negative for abdominal pain, constipation and diarrhea.  Musculoskeletal:  Negative for arthralgias and myalgias.  Neurological:  Negative for dizziness,  weakness, light-headedness and headaches.     Lab Results  Component Value Date   NA 140 12/15/2022   K 4.5 12/15/2022   CO2 24 12/15/2022   GLUCOSE 105 (H) 12/15/2022   BUN 15 12/15/2022   CREATININE 0.75 12/15/2022   CALCIUM 9.9 12/15/2022   EGFR 83 12/15/2022   GFRNONAA 76 06/08/2019   Lab Results  Component Value Date   CHOL 208 (H) 12/15/2022   HDL 91 12/15/2022   LDLCALC 100 (H) 12/15/2022   TRIG 96 12/15/2022   CHOLHDL 2.3 12/15/2022   Lab Results  Component Value Date   TSH 0.736 06/12/2023   Lab Results  Component Value Date   HGBA1C 5.0 09/02/2016   Lab Results  Component Value Date   WBC 10.7 12/15/2022   HGB 13.2 12/15/2022   HCT 39.6 12/15/2022   MCV 100 (H) 12/15/2022   PLT 275 12/15/2022   Lab Results  Component Value Date   ALT 11 12/15/2022   AST 25 12/15/2022   ALKPHOS 73 12/15/2022   BILITOT 0.3 12/15/2022   Lab Results  Component Value Date   VD25OH 56 11/29/2019     Patient Active Problem List   Diagnosis Date Noted   Benign essential tremor 12/15/2022   History of colonic polyps    Gastritis without bleeding    Mild intermittent asthma 07/10/2021   Lower leg edema 12/17/2020   Mixed hyperlipidemia 12/17/2020   History of Helicobacter pylori infection 07/01/2019   GERD without esophagitis 06/08/2019   Age-related osteoporosis without current pathological fracture  06/08/2019   Hypothyroidism due to acquired atrophy of thyroid  11/02/2018   Vitamin D  deficiency 02/16/2018   Tingling in extremities 02/16/2018   Myalgia 02/02/2018   Adenomatous colon polyp 10/07/2017   AVM (arteriovenous malformation) of small bowel, acquired 03/20/2017   Environmental and seasonal allergies 05/13/2016   Degenerative arthritis of knee, bilateral 11/26/2015    Allergies  Allergen Reactions   Aspirin Hives   Penicillins Other (See Comments)   Dairy Aid [Tilactase] Diarrhea    Past Surgical History:  Procedure Laterality Date   arm surgery      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 Viktoria   COLONOSCOPY N/A 02/20/2022   Procedure: COLONOSCOPY;  Surgeon: Jinny Carmine, MD;  Location: [facility];  Service: Endoscopy;  Laterality: N/A;   ESOPHAGOGASTRODUODENOSCOPY N/A 02/20/2022   Procedure: ESOPHAGOGASTRODUODENOSCOPY (EGD) WITH BIOPSY;  Surgeon: Jinny Carmine, MD;  Location: [facility];  Service: Endoscopy;  Laterality: N/A;   KNEE SURGERY     five different surgery    Social History   Tobacco Use   Smoking status: Every Day    Current packs/day: 0.25    Average packs/day: 0.3 packs/day for 57.5 years (14.4 ttl pk-yrs)    Types: Cigarettes    Start date: 1968   Smokeless tobacco: Never   Tobacco comments:    4-5 cigarettes daily.  Off and on since age 25  Vaping Use   Vaping status: Never Used  Substance Use Topics   Alcohol use: Yes    Comment: 1 glass of wine 1-2 times per month   Drug use: No     Medication list has been reviewed and updated.  Current Meds  Medication Sig   CALCIUM-VITAMIN D  PO Take by mouth daily.   famotidine (PEPCID) 10 MG tablet Take 10 mg by mouth daily.   ferrous sulfate 325 (65 FE) MG tablet Take 325 mg by mouth daily with breakfast.   Fluticasone -Umeclidin-Vilant (TRELEGY ELLIPTA ) 100-62.5-25 MCG/ACT AEPB INHALE 1 PUFF INTO LUNGS ONCE DAILY RINSE MOUTH AFTER USE   levothyroxine  (SYNTHROID ) 75 MCG tablet TAKE 1 TABLET BY MOUTH ONCE DAILY ON AN EMPTY STOMACH. WAIT 30 MINUTES BEFORE TAKING OTHER MEDS   Multiple Vitamins-Minerals (MULTIPLE VITAMINS/WOMENS PO) Take by mouth.   omeprazole  (PRILOSEC) 20 MG capsule Take 1 capsule by mouth once daily   VENTOLIN  HFA 108 (90 Base) MCG/ACT inhaler INHALE 2 PUFFS BY MOUTH EVERY 6 HOURS AS NEEDED FOR WHEEZING FOR SHORTNESS OF BREATH   Zoledronic  Acid (RECLAST  IV) Inject into the vein. April   [DISCONTINUED] gabapentin   (NEURONTIN ) 100 MG capsule Take 1 capsule (100 mg total) by mouth 4 (four) times daily.       12/17/2023    8:47 AM 06/12/2023    8:08 AM 12/15/2022    8:41 AM 12/11/2021   10:13 AM  GAD 7 : Generalized Anxiety Score  Nervous, Anxious, on Edge 2 2 0 0  Control/stop worrying 2 2 0 0  Worry too much - different things 2 2 0 0  Trouble relaxing 0 0 0 0  Restless 0 0 0 0  Easily annoyed or irritable 0 0 0 0  Afraid - awful might happen 2 3 0 0  Total GAD 7 Score 8 9 0 0  Anxiety Difficulty Somewhat difficult Very difficult Not difficult at all Not difficult at  all       12/17/2023    8:46 AM 06/12/2023    8:07 AM 05/14/2023    2:25 PM  Depression screen PHQ 2/9  Decreased Interest 3 3 1   Down, Depressed, Hopeless 3 3 1   PHQ - 2 Score 6 6 2   Altered sleeping 0 0 0  Tired, decreased energy 0 0 0  Change in appetite 1 1 0  Feeling bad or failure about yourself  0 0 0  Trouble concentrating 0 0 0  Moving slowly or fidgety/restless 0 0 0  Suicidal thoughts 0 0 0  PHQ-9 Score 7 7 2   Difficult doing work/chores Somewhat difficult Somewhat difficult Somewhat difficult    BP Readings from Last 3 Encounters:  12/17/23 112/68  06/12/23 120/70  12/15/22 124/68    Physical Exam Vitals and nursing note reviewed.  Constitutional:      General: She is not in acute distress.    Appearance: Normal appearance. She is well-developed. She is not ill-appearing.  HENT:     Head: Normocephalic and atraumatic.     Right Ear: Tympanic membrane and ear canal normal.     Left Ear: Tympanic membrane and ear canal normal.     Nose:     Right Sinus: No maxillary sinus tenderness.     Left Sinus: No maxillary sinus tenderness.  Eyes:     General: No scleral icterus.       Right eye: No discharge.        Left eye: No discharge.     Conjunctiva/sclera: Conjunctivae normal.  Neck:     Thyroid : No thyromegaly.     Vascular: No carotid bruit.  Cardiovascular:     Rate and Rhythm: Normal rate and  regular rhythm.     Pulses: Normal pulses.     Heart sounds: Normal heart sounds.  Pulmonary:     Effort: Pulmonary effort is normal. No respiratory distress.     Breath sounds: Normal breath sounds and air entry.  Abdominal:     General: Bowel sounds are normal.     Palpations: Abdomen is soft.     Tenderness: There is no abdominal tenderness.  Musculoskeletal:        General: No swelling.     Right hand: No swelling or bony tenderness. Decreased range of motion.     Left hand: Bony tenderness present. No swelling. Decreased range of motion.     Cervical back: Normal range of motion. No erythema.     Right lower leg: No edema.     Left lower leg: No edema.  Lymphadenopathy:     Cervical: No cervical adenopathy.  Skin:    General: Skin is warm and dry.     Capillary Refill: Capillary refill takes less than 2 seconds.     Findings: No lesion or rash.  Neurological:     General: No focal deficit present.     Mental Status: She is alert and oriented to person, place, and time.     Cranial Nerves: No cranial nerve deficit.     Sensory: No sensory deficit.     Deep Tendon Reflexes: Reflexes are normal and symmetric.  Psychiatric:        Attention and Perception: Attention normal.        Mood and Affect: Mood normal.     Wt Readings from Last 3 Encounters:  12/17/23 111 lb (50.3 kg)  06/12/23 109 lb 9.6 oz (49.7 kg)  05/14/23 108 lb (49  kg)    BP 112/68   Pulse 99   Ht 5' 4 (1.626 m)   Wt 111 lb (50.3 kg)   SpO2 98%   BMI 19.05 kg/m   Assessment and Plan:  Problem List Items Addressed This Visit       Unprioritized   Hypothyroidism due to acquired atrophy of thyroid  - Primary (Chronic)   Supplemented. Lab Results  Component Value Date   TSH 0.736 06/12/2023         Relevant Orders   TSH + free T4   GERD without esophagitis (Chronic)   Reflux symptoms are controlled on Pepcid daily with PRN omeprazole . Concerned about gastric polyps with chronic PPI  use. Patient denies red flag symptoms - no melena, weight loss, dysphagia. Continue daily Pepcid and use omeprazole  prn.       Relevant Orders   CBC with Differential/Platelet   Age-related osteoporosis without current pathological fracture (Chronic)   Being followed and treated by Endo Last Reclast  infusion in May.       Relevant Orders   Comprehensive metabolic panel with GFR   VITAMIN D  25 Hydroxy (Vit-D Deficiency, Fractures)   Mixed hyperlipidemia (Chronic)   Managed with diet only. Statin would be appropriate due to elevated risk. Patient declines at this this.      Relevant Orders   Lipid panel   Mild intermittent asthma (Chronic)   Doing well on Trelegy with PRN albuterol . Few wheezed noted today - encourage use of Albuterol  more often for wheezing/sob.      Vitamin D  deficiency   Supplemented orally.       Tingling in extremities   Has been seen by Neurology - Dr. Lane and prescribed gabapentin .      Relevant Medications   gabapentin  (NEURONTIN ) 100 MG capsule   Benign essential tremor   Tremor is unchanged - some days are worse than others. At this time she is not interested in Neurology consult She can call if this changes.      Other Visit Diagnoses       Encounter for screening mammogram for breast cancer       Relevant Orders   MM 3D SCREENING MAMMOGRAM BILATERAL BREAST       Return in about 5 months (around 05/18/2024) for GERD, gen f/u.    Leita HILARIO Adie, MD [facility] [place]

## 2023-12-17 NOTE — Assessment & Plan Note (Signed)
 Has been seen by Neurology - Dr. Lane and prescribed gabapentin .

## 2023-12-17 NOTE — Assessment & Plan Note (Signed)
 Supplemented orally

## 2023-12-17 NOTE — Assessment & Plan Note (Addendum)
 Doing well on Trelegy with PRN albuterol . Few wheezed noted today - encourage use of Albuterol  more often for wheezing/sob.

## 2023-12-17 NOTE — Assessment & Plan Note (Signed)
 Being followed and treated by Endo Last Reclast  infusion in May.

## 2023-12-17 NOTE — Patient Instructions (Signed)
 Call [facility] to schedule your mammogram at [phone].

## 2023-12-17 NOTE — Assessment & Plan Note (Signed)
 Managed with diet only. Statin would be appropriate due to elevated risk. Patient declines at this this.

## 2023-12-17 NOTE — Assessment & Plan Note (Signed)
 Supplemented. Lab Results  Component Value Date   TSH 0.736 06/12/2023

## 2024-01-04 DIAGNOSIS — E782 Mixed hyperlipidemia: Secondary | ICD-10-CM | POA: Diagnosis not present

## 2024-01-04 DIAGNOSIS — M810 Age-related osteoporosis without current pathological fracture: Secondary | ICD-10-CM | POA: Diagnosis not present

## 2024-01-04 DIAGNOSIS — K219 Gastro-esophageal reflux disease without esophagitis: Secondary | ICD-10-CM | POA: Diagnosis not present

## 2024-01-04 DIAGNOSIS — E034 Atrophy of thyroid (acquired): Secondary | ICD-10-CM | POA: Diagnosis not present

## 2024-01-05 ENCOUNTER — Other Ambulatory Visit: Payer: Self-pay | Admitting: Internal Medicine

## 2024-01-05 ENCOUNTER — Ambulatory Visit: Payer: Self-pay | Admitting: Internal Medicine

## 2024-01-05 LAB — TSH+FREE T4
Free T4: 1.46 ng/dL (ref 0.82–1.77)
TSH: 0.625 u[IU]/mL (ref 0.450–4.500)

## 2024-01-05 LAB — COMPREHENSIVE METABOLIC PANEL WITH GFR
ALT: 11 IU/L (ref 0–32)
AST: 25 IU/L (ref 0–40)
Albumin: 4.2 g/dL (ref 3.8–4.8)
Alkaline Phosphatase: 70 IU/L (ref 44–121)
BUN/Creatinine Ratio: 17 (ref 12–28)
BUN: 13 mg/dL (ref 8–27)
Bilirubin Total: 0.4 mg/dL (ref 0.0–1.2)
CO2: 20 mmol/L (ref 20–29)
Calcium: 9.3 mg/dL (ref 8.7–10.3)
Chloride: 103 mmol/L (ref 96–106)
Creatinine, Ser: 0.77 mg/dL (ref 0.57–1.00)
Globulin, Total: 2.4 g/dL (ref 1.5–4.5)
Glucose: 125 mg/dL — ABNORMAL HIGH (ref 70–99)
Potassium: 4.7 mmol/L (ref 3.5–5.2)
Sodium: 139 mmol/L (ref 134–144)
Total Protein: 6.6 g/dL (ref 6.0–8.5)
eGFR: 80 mL/min/1.73 (ref >59)

## 2024-01-05 LAB — CBC WITH DIFFERENTIAL/PLATELET
Basophils Absolute: 0.1 x10E3/uL (ref 0.0–0.2)
Basos: 1 %
EOS (ABSOLUTE): 0.1 x10E3/uL (ref 0.0–0.4)
Eos: 1 %
Hematocrit: 41.2 % (ref 34.0–46.6)
Hemoglobin: 13.7 g/dL (ref 11.1–15.9)
Immature Grans (Abs): 0 x10E3/uL (ref 0.0–0.1)
Immature Granulocytes: 0 %
Lymphocytes Absolute: 1.9 x10E3/uL (ref 0.7–3.1)
Lymphs: 19 %
MCH: 33.2 pg — ABNORMAL HIGH (ref 26.6–33.0)
MCHC: 33.3 g/dL (ref 31.5–35.7)
MCV: 100 fL — ABNORMAL HIGH (ref 79–97)
Monocytes Absolute: 0.6 x10E3/uL (ref 0.1–0.9)
Monocytes: 7 %
Neutrophils Absolute: 6.9 x10E3/uL (ref 1.4–7.0)
Neutrophils: 72 %
Platelets: 245 x10E3/uL (ref 150–450)
RBC: 4.13 x10E6/uL (ref 3.77–5.28)
RDW: 11.3 % — ABNORMAL LOW (ref 11.7–15.4)
WBC: 9.5 x10E3/uL (ref 3.4–10.8)

## 2024-01-05 LAB — LIPID PANEL
Chol/HDL Ratio: 2.1 ratio (ref 0.0–4.4)
Cholesterol, Total: 182 mg/dL (ref 100–199)
HDL: 85 mg/dL (ref >39)
LDL Chol Calc (NIH): 80 mg/dL (ref 0–99)
Triglycerides: 93 mg/dL (ref 0–149)
VLDL Cholesterol Cal: 17 mg/dL (ref 5–40)

## 2024-01-05 LAB — VITAMIN D 25 HYDROXY (VIT D DEFICIENCY, FRACTURES): Vit D, 25-Hydroxy: 72.6 ng/mL (ref 30.0–100.0)

## 2024-01-06 NOTE — Telephone Encounter (Signed)
 Requested Prescriptions  Pending Prescriptions Disp Refills   VENTOLIN  HFA 108 (90 Base) MCG/ACT inhaler [Pharmacy Med Name: Ventolin  HFA 108 (90 Base) MCG/ACT Inhalation Aerosol Solution] 18 g 0    Sig: INHALE 2 PUFFS BY MOUTH EVERY 6 HOURS AS NEEDED FOR WHEEZING FOR SHORTNESS OF BREATH     Pulmonology:  Beta Agonists 2 Passed - 01/06/2024  1:05 PM      Passed - Last BP in normal range    BP Readings from Last 1 Encounters:  12/17/23 112/68         Passed - Last Heart Rate in normal range    Pulse Readings from Last 1 Encounters:  12/17/23 99         Passed - Valid encounter within last 12 months    Recent Outpatient Visits           2 weeks ago Hypothyroidism due to acquired atrophy of thyroid    [facility] at [facility], Leita DEL, MD

## 2024-01-25 ENCOUNTER — Other Ambulatory Visit: Payer: Self-pay

## 2024-01-25 ENCOUNTER — Encounter: Payer: Self-pay | Admitting: Internal Medicine

## 2024-01-25 MED ORDER — VENTOLIN HFA 108 (90 BASE) MCG/ACT IN AERS: 2.0000 | INHALATION_SPRAY | Freq: Four times a day (QID) | RESPIRATORY_TRACT | 2 refills | Status: DC | PRN

## 2024-01-30 ENCOUNTER — Other Ambulatory Visit: Payer: Self-pay | Admitting: Internal Medicine

## 2024-01-30 DIAGNOSIS — R1013 Epigastric pain: Secondary | ICD-10-CM

## 2024-02-01 NOTE — Telephone Encounter (Signed)
 Requested Prescriptions  Pending Prescriptions Disp Refills   omeprazole  (PRILOSEC) 20 MG capsule [Pharmacy Med Name: Omeprazole  20 MG Oral Capsule Delayed Release] 90 capsule 1    Sig: Take 1 capsule by mouth once daily     Gastroenterology: Proton Pump Inhibitors Passed - 02/01/2024  3:17 PM      Passed - Valid encounter within last 12 months    Recent Outpatient Visits           1 month ago Hypothyroidism due to acquired atrophy of thyroid    [facility] at [facility], Leita DEL, MD

## 2024-02-12 DIAGNOSIS — M170 Bilateral primary osteoarthritis of knee: Secondary | ICD-10-CM | POA: Diagnosis not present

## 2024-02-18 DIAGNOSIS — M25561 Pain in right knee: Secondary | ICD-10-CM | POA: Diagnosis not present

## 2024-02-18 DIAGNOSIS — R600 Localized edema: Secondary | ICD-10-CM | POA: Diagnosis not present

## 2024-02-18 DIAGNOSIS — M25562 Pain in left knee: Secondary | ICD-10-CM | POA: Diagnosis not present

## 2024-02-24 DIAGNOSIS — M25561 Pain in right knee: Secondary | ICD-10-CM | POA: Diagnosis not present

## 2024-02-24 DIAGNOSIS — M25562 Pain in left knee: Secondary | ICD-10-CM | POA: Diagnosis not present

## 2024-02-24 DIAGNOSIS — R600 Localized edema: Secondary | ICD-10-CM | POA: Diagnosis not present

## 2024-02-26 DIAGNOSIS — M25562 Pain in left knee: Secondary | ICD-10-CM | POA: Diagnosis not present

## 2024-02-26 DIAGNOSIS — M25561 Pain in right knee: Secondary | ICD-10-CM | POA: Diagnosis not present

## 2024-02-26 DIAGNOSIS — R600 Localized edema: Secondary | ICD-10-CM | POA: Diagnosis not present

## 2024-03-01 DIAGNOSIS — R600 Localized edema: Secondary | ICD-10-CM | POA: Diagnosis not present

## 2024-03-01 DIAGNOSIS — M25561 Pain in right knee: Secondary | ICD-10-CM | POA: Diagnosis not present

## 2024-03-01 DIAGNOSIS — M25562 Pain in left knee: Secondary | ICD-10-CM | POA: Diagnosis not present

## 2024-03-03 DIAGNOSIS — R600 Localized edema: Secondary | ICD-10-CM | POA: Diagnosis not present

## 2024-03-03 DIAGNOSIS — M25561 Pain in right knee: Secondary | ICD-10-CM | POA: Diagnosis not present

## 2024-03-03 DIAGNOSIS — M25562 Pain in left knee: Secondary | ICD-10-CM | POA: Diagnosis not present

## 2024-03-05 ENCOUNTER — Other Ambulatory Visit: Payer: Self-pay | Admitting: Internal Medicine

## 2024-03-05 DIAGNOSIS — E034 Atrophy of thyroid (acquired): Secondary | ICD-10-CM

## 2024-03-08 NOTE — Telephone Encounter (Signed)
 Requested Prescriptions  Pending Prescriptions Disp Refills   levothyroxine  (SYNTHROID ) 75 MCG tablet [Pharmacy Med Name: Levothyroxine  Sodium 75 MCG Oral Tablet] 90 tablet 2    Sig: TAKE 1 TABLET BY MOUTH ONCE DAILY ON  AN  EMPTY  STOMACH  -  WAIT  30  MINUTES  BEFORE  TAKING  OTHER  MEDICATIONS     Endocrinology:  Hypothyroid Agents Passed - 03/08/2024 11:39 AM      Passed - TSH in normal range and within 360 days    TSH  Date Value Ref Range Status  01/04/2024 0.625 0.450 - 4.500 uIU/mL Final         Passed - Valid encounter within last 12 months    Recent Outpatient Visits           2 months ago Hypothyroidism due to acquired atrophy of thyroid    [facility] at [facility], Leita DEL, MD

## 2024-03-10 ENCOUNTER — Other Ambulatory Visit: Payer: Self-pay | Admitting: Internal Medicine

## 2024-03-10 DIAGNOSIS — J4520 Mild intermittent asthma, uncomplicated: Secondary | ICD-10-CM

## 2024-03-11 ENCOUNTER — Other Ambulatory Visit: Payer: Self-pay

## 2024-03-11 NOTE — Telephone Encounter (Signed)
 Requested medications are due for refill today.  yes  Requested medications are on the active medications list.  yes  Last refill. 12/15/2023   Future visit scheduled.   yes  Notes to clinic.  Medication not assigned to a protocol. Please review for refill.    Requested Prescriptions  Pending Prescriptions Disp Refills   TRELEGY ELLIPTA  100-62.5-25 MCG/ACT AEPB [Pharmacy Med Name: Trelegy Ellipta  100-62.5-25 MCG/INH Inhalation Aerosol Powder Breath Activated] 180 each 0    Sig: INHALE 1 PUFF ONCE DAILY RINSE MOUTH AFTER USE     Off-Protocol Failed - 03/11/2024  1:10 PM      Failed - Medication not assigned to a protocol, review manually.      Passed - Valid encounter within last 12 months    Recent Outpatient Visits           2 months ago Hypothyroidism due to acquired atrophy of thyroid    [facility] at [facility], Leita DEL, MD

## 2024-03-17 DIAGNOSIS — M25562 Pain in left knee: Secondary | ICD-10-CM | POA: Diagnosis not present

## 2024-03-17 DIAGNOSIS — M25561 Pain in right knee: Secondary | ICD-10-CM | POA: Diagnosis not present

## 2024-03-17 DIAGNOSIS — R600 Localized edema: Secondary | ICD-10-CM | POA: Diagnosis not present

## 2024-03-22 DIAGNOSIS — M25562 Pain in left knee: Secondary | ICD-10-CM | POA: Diagnosis not present

## 2024-03-22 DIAGNOSIS — R600 Localized edema: Secondary | ICD-10-CM | POA: Diagnosis not present

## 2024-03-22 DIAGNOSIS — M25561 Pain in right knee: Secondary | ICD-10-CM | POA: Diagnosis not present

## 2024-03-28 DIAGNOSIS — M25561 Pain in right knee: Secondary | ICD-10-CM | POA: Diagnosis not present

## 2024-03-28 DIAGNOSIS — M25562 Pain in left knee: Secondary | ICD-10-CM | POA: Diagnosis not present

## 2024-03-28 DIAGNOSIS — R600 Localized edema: Secondary | ICD-10-CM | POA: Diagnosis not present

## 2024-03-30 DIAGNOSIS — R600 Localized edema: Secondary | ICD-10-CM | POA: Diagnosis not present

## 2024-03-30 DIAGNOSIS — M25562 Pain in left knee: Secondary | ICD-10-CM | POA: Diagnosis not present

## 2024-03-30 DIAGNOSIS — M25561 Pain in right knee: Secondary | ICD-10-CM | POA: Diagnosis not present

## 2024-04-01 ENCOUNTER — Other Ambulatory Visit: Payer: Self-pay | Admitting: Internal Medicine

## 2024-04-04 DIAGNOSIS — M25561 Pain in right knee: Secondary | ICD-10-CM | POA: Diagnosis not present

## 2024-04-04 DIAGNOSIS — R600 Localized edema: Secondary | ICD-10-CM | POA: Diagnosis not present

## 2024-04-04 DIAGNOSIS — M25562 Pain in left knee: Secondary | ICD-10-CM | POA: Diagnosis not present

## 2024-04-04 NOTE — Telephone Encounter (Signed)
 Requested Prescriptions  Pending Prescriptions Disp Refills   VENTOLIN  HFA 108 (90 Base) MCG/ACT inhaler [Pharmacy Med Name: Ventolin  HFA 108 (90 Base) MCG/ACT Inhalation Aerosol Solution] 18 g 0    Sig: INHALE 2 PUFFS BY MOUTH EVERY 6 HOURS AS NEEDED FOR WHEEZING OR SHORTNESS OF BREATH     Pulmonology:  Beta Agonists 2 Passed - 04/04/2024 11:27 AM      Passed - Last BP in normal range    BP Readings from Last 1 Encounters:  12/17/23 112/68         Passed - Last Heart Rate in normal range    Pulse Readings from Last 1 Encounters:  12/17/23 99         Passed - Valid encounter within last 12 months    Recent Outpatient Visits           3 months ago Hypothyroidism due to acquired atrophy of thyroid    [facility] at [facility], Leita DEL, MD

## 2024-04-19 DIAGNOSIS — M25562 Pain in left knee: Secondary | ICD-10-CM | POA: Diagnosis not present

## 2024-04-19 DIAGNOSIS — M25561 Pain in right knee: Secondary | ICD-10-CM | POA: Diagnosis not present

## 2024-04-19 DIAGNOSIS — R600 Localized edema: Secondary | ICD-10-CM | POA: Diagnosis not present

## 2024-04-21 ENCOUNTER — Other Ambulatory Visit: Payer: Self-pay | Admitting: Internal Medicine

## 2024-04-21 DIAGNOSIS — R600 Localized edema: Secondary | ICD-10-CM | POA: Diagnosis not present

## 2024-04-21 DIAGNOSIS — M25561 Pain in right knee: Secondary | ICD-10-CM | POA: Diagnosis not present

## 2024-04-21 DIAGNOSIS — M25562 Pain in left knee: Secondary | ICD-10-CM | POA: Diagnosis not present

## 2024-04-22 NOTE — Telephone Encounter (Signed)
 Requested medications are due for refill today.  unsure  Requested medications are on the active medications list.  yes  Last refill. 04/04/2024 18g 0 rf  Future visit scheduled.   yes  Notes to clinic.  Recently refilled. Please advise.    Requested Prescriptions  Pending Prescriptions Disp Refills   VENTOLIN  HFA 108 (90 Base) MCG/ACT inhaler [Pharmacy Med Name: Ventolin  HFA 108 (90 Base) MCG/ACT Inhalation Aerosol Solution] 18 g 0    Sig: INHALE 2 PUFFS BY MOUTH EVERY 6 HOURS AS NEEDED FOR WHEEZING FOR SHORTNESS OF BREATH     Pulmonology:  Beta Agonists 2 Passed - 04/22/2024  5:56 PM      Passed - Last BP in normal range    BP Readings from Last 1 Encounters:  12/17/23 112/68         Passed - Last Heart Rate in normal range    Pulse Readings from Last 1 Encounters:  12/17/23 99         Passed - Valid encounter within last 12 months    Recent Outpatient Visits           4 months ago Hypothyroidism due to acquired atrophy of thyroid    [facility] at [facility], Leita DEL, MD

## 2024-04-26 ENCOUNTER — Other Ambulatory Visit: Payer: Self-pay | Admitting: Internal Medicine

## 2024-04-27 DIAGNOSIS — Z23 Encounter for immunization: Secondary | ICD-10-CM | POA: Diagnosis not present

## 2024-04-28 NOTE — Telephone Encounter (Signed)
 Requested Prescriptions  Pending Prescriptions Disp Refills   VENTOLIN  HFA 108 (90 Base) MCG/ACT inhaler [Pharmacy Med Name: Ventolin  HFA 108 (90 Base) MCG/ACT Inhalation Aerosol Solution] 18 g 2    Sig: INHALE 2 PUFFS BY MOUTH EVERY 6 HOURS AS NEEDED FOR WHEEZING FOR SHORTNESS OF BREATH     Pulmonology:  Beta Agonists 2 Passed - 04/28/2024  2:12 PM      Passed - Last BP in normal range    BP Readings from Last 1 Encounters:  12/17/23 112/68         Passed - Last Heart Rate in normal range    Pulse Readings from Last 1 Encounters:  12/17/23 99         Passed - Valid encounter within last 12 months    Recent Outpatient Visits           4 months ago Hypothyroidism due to acquired atrophy of thyroid    [facility] at [facility], Leita DEL, MD

## 2024-04-29 DIAGNOSIS — M25562 Pain in left knee: Secondary | ICD-10-CM | POA: Diagnosis not present

## 2024-04-29 DIAGNOSIS — M25561 Pain in right knee: Secondary | ICD-10-CM | POA: Diagnosis not present

## 2024-04-29 DIAGNOSIS — R600 Localized edema: Secondary | ICD-10-CM | POA: Diagnosis not present

## 2024-05-06 DIAGNOSIS — Z23 Encounter for immunization: Secondary | ICD-10-CM | POA: Diagnosis not present

## 2024-05-09 DIAGNOSIS — M25561 Pain in right knee: Secondary | ICD-10-CM | POA: Diagnosis not present

## 2024-05-09 DIAGNOSIS — R600 Localized edema: Secondary | ICD-10-CM | POA: Diagnosis not present

## 2024-05-09 DIAGNOSIS — M25562 Pain in left knee: Secondary | ICD-10-CM | POA: Diagnosis not present

## 2024-05-11 DIAGNOSIS — M25561 Pain in right knee: Secondary | ICD-10-CM | POA: Diagnosis not present

## 2024-05-11 DIAGNOSIS — M25562 Pain in left knee: Secondary | ICD-10-CM | POA: Diagnosis not present

## 2024-05-11 DIAGNOSIS — R600 Localized edema: Secondary | ICD-10-CM | POA: Diagnosis not present

## 2024-05-12 ENCOUNTER — Ambulatory Visit (INDEPENDENT_AMBULATORY_CARE_PROVIDER_SITE_OTHER): Admitting: Internal Medicine

## 2024-05-12 ENCOUNTER — Encounter: Payer: Self-pay | Admitting: Internal Medicine

## 2024-05-12 VITALS — BP 122/80 | Ht 64.0 in | Wt 111.0 lb

## 2024-05-12 DIAGNOSIS — Z1231 Encounter for screening mammogram for malignant neoplasm of breast: Secondary | ICD-10-CM

## 2024-05-12 DIAGNOSIS — K219 Gastro-esophageal reflux disease without esophagitis: Secondary | ICD-10-CM | POA: Diagnosis not present

## 2024-05-12 DIAGNOSIS — E034 Atrophy of thyroid (acquired): Secondary | ICD-10-CM | POA: Diagnosis not present

## 2024-05-12 DIAGNOSIS — J4520 Mild intermittent asthma, uncomplicated: Secondary | ICD-10-CM

## 2024-05-12 MED ORDER — TRELEGY ELLIPTA 100-62.5-25 MCG/ACT IN AEPB: 1.0000 | INHALATION_SPRAY | Freq: Every day | RESPIRATORY_TRACT | 1 refills | Status: AC

## 2024-05-12 NOTE — Assessment & Plan Note (Signed)
 Stable mild symptoms but unfortunately she continues to smoke cigarettes. Continue Albuterol  PRN and Trelegy daily. Trelegy refilled.

## 2024-05-12 NOTE — Assessment & Plan Note (Signed)
 Currently taking Pepcid or omeprazole  as needed with minimal reflux symptoms. Patient denies red flag symptoms - no melena, weight loss, dysphagia. Will maintain current management.

## 2024-05-12 NOTE — Progress Notes (Signed)
 Date:  05/12/2024   Name:  Lindsey Golden   DOB:  1945-07-18   MRN:  [id number]   Chief Complaint: No chief complaint on file.  Gastroesophageal Reflux She complains of coughing, heartburn and wheezing. She reports no chest pain. This is a recurrent problem. The problem occurs occasionally. The heartburn is of mild intensity. The heartburn does not wake her from sleep. The heartburn does not limit her activity. The heartburn doesn't change with position. Pertinent negatives include no fatigue or weight loss. She has tried a PPI and a histamine-2 antagonist for the symptoms.  Thyroid  Problem Presents for follow-up visit. Patient reports no anxiety, depressed mood, fatigue, palpitations, weight gain or weight loss.  Asthma She complains of cough and wheezing. This is a recurrent problem. The problem occurs intermittently. Associated symptoms include heartburn. Pertinent negatives include no chest pain, fever, headaches or weight loss. Her symptoms are alleviated by beta-agonist and steroid inhaler. Her past medical history is significant for asthma.    Review of Systems  Constitutional:  Negative for chills, fatigue, fever, unexpected weight change, weight gain and weight loss.  Respiratory:  Positive for cough and wheezing.   Cardiovascular:  Negative for chest pain and palpitations.  Gastrointestinal:  Positive for heartburn.  Musculoskeletal:  Positive for arthralgias and back pain.  Neurological:  Negative for dizziness, light-headedness and headaches.  Psychiatric/Behavioral:  Negative for dysphoric mood and sleep disturbance. The patient is not nervous/anxious.      Lab Results  Component Value Date   NA 139 01/04/2024   K 4.7 01/04/2024   CO2 20 01/04/2024   GLUCOSE 125 (H) 01/04/2024   BUN 13 01/04/2024   CREATININE 0.77 01/04/2024   CALCIUM 9.3 01/04/2024   EGFR 80 01/04/2024   GFRNONAA 76 06/08/2019   Lab Results  Component Value Date   CHOL 182 01/04/2024    HDL 85 01/04/2024   LDLCALC 80 01/04/2024   TRIG 93 01/04/2024   CHOLHDL 2.1 01/04/2024   Lab Results  Component Value Date   TSH 0.625 01/04/2024   Lab Results  Component Value Date   HGBA1C 5.0 09/02/2016   Lab Results  Component Value Date   WBC 9.5 01/04/2024   HGB 13.7 01/04/2024   HCT 41.2 01/04/2024   MCV 100 (H) 01/04/2024   PLT 245 01/04/2024   Lab Results  Component Value Date   ALT 11 01/04/2024   AST 25 01/04/2024   ALKPHOS 70 01/04/2024   BILITOT 0.4 01/04/2024   Lab Results  Component Value Date   VD25OH 72.6 01/04/2024     Patient Active Problem List   Diagnosis Date Noted   Benign essential tremor 12/15/2022   History of colonic polyps    Gastritis without bleeding    Mild intermittent asthma 07/10/2021   Lower leg edema 12/17/2020   Mixed hyperlipidemia 12/17/2020   History of Helicobacter pylori infection 07/01/2019   GERD without esophagitis 06/08/2019   Age-related osteoporosis without current pathological fracture 06/08/2019   Hypothyroidism due to acquired atrophy of thyroid  11/02/2018   Vitamin D  deficiency 02/16/2018   Tingling in extremities 02/16/2018   Myalgia 02/02/2018   Adenomatous colon polyp 10/07/2017   AVM (arteriovenous malformation) of small bowel, 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 Viktoria   COLONOSCOPY N/A 02/20/2022   Procedure: COLONOSCOPY;  Surgeon: Jinny Carmine, MD;  Location: [facility];  Service: Endoscopy;  Laterality: N/A;   ESOPHAGOGASTRODUODENOSCOPY N/A 02/20/2022    Procedure: ESOPHAGOGASTRODUODENOSCOPY (EGD) WITH BIOPSY;  Surgeon: Jinny Carmine, MD;  Location: [facility];  Service: Endoscopy;  Laterality: N/A;   KNEE SURGERY     five different surgery    Social History   Tobacco Use   Smoking status: Every Day    Current packs/day: 0.25    Average packs/day: 0.3 packs/day for 57.9 years (14.5 ttl pk-yrs)    Types: Cigarettes    Start date: 1968   Smokeless tobacco: Never   Tobacco comments:    4-5 cigarettes daily.  Off and on since age 20  Vaping Use   Vaping status: Never Used  Substance Use Topics   Alcohol use: Yes    Comment: 1 glass of wine 1-2 times per month   Drug use: No     Medication list has been reviewed and updated.  Current Meds  Medication Sig   CALCIUM-VITAMIN D  PO Take by mouth daily.   famotidine (PEPCID) 10 MG tablet Take 10 mg by mouth daily.   ferrous sulfate 325 (65 FE) MG tablet Take 325 mg by mouth daily with breakfast.   gabapentin  (NEURONTIN ) 100 MG capsule Take 1 capsule (100 mg total) by mouth 4 (four) times daily.   levothyroxine  (SYNTHROID ) 75 MCG tablet TAKE 1 TABLET BY MOUTH ONCE DAILY ON  AN  EMPTY  STOMACH  -  WAIT  30  MINUTES  BEFORE  TAKING  OTHER  MEDICATIONS   Multiple Vitamins-Minerals (MULTIPLE VITAMINS/WOMENS PO) Take by mouth.   omeprazole  (PRILOSEC) 20 MG capsule Take 1 capsule by mouth once daily   VENTOLIN  HFA 108 (90 Base) MCG/ACT inhaler INHALE 2 PUFFS BY MOUTH EVERY 6 HOURS AS NEEDED FOR WHEEZING FOR SHORTNESS OF BREATH   Zoledronic  Acid (RECLAST  IV) Inject into the vein. April   [DISCONTINUED] TRELEGY ELLIPTA  100-62.5-25 MCG/ACT AEPB INHALE 1 PUFF ONCE DAILY RINSE MOUTH AFTER USE       05/12/2024    9:42 AM 12/17/2023    8:47 AM 06/12/2023    8:08 AM 12/15/2022    8:41 AM  GAD 7 : Generalized Anxiety Score  Nervous, Anxious, on Edge 0 2 2 0  Control/stop worrying 0 2 2 0  Worry too much - different things 0 2 2 0  Trouble relaxing 0 0 0 0  Restless 0 0 0 0  Easily annoyed  or irritable 0 0 0 0  Afraid - awful might happen 0 2 3 0  Total GAD 7 Score 0 8 9 0  Anxiety Difficulty Not difficult at all Somewhat difficult Very difficult Not difficult at all       05/12/2024    9:42 AM 12/17/2023    8:46 AM 06/12/2023    8:07 AM  Depression screen PHQ 2/9  Decreased Interest 0 3 3  Down, Depressed, Hopeless 0 3 3  PHQ - 2 Score 0 6 6  Altered sleeping 0 0 0  Tired, decreased energy 0 0 0  Change in appetite 0 1 1  Feeling bad or failure about yourself  0 0 0  Trouble concentrating 0 0 0  Moving slowly or fidgety/restless 0 0 0  Suicidal thoughts 0 0 0  PHQ-9 Score 0 7  7   Difficult doing work/chores  Somewhat difficult Somewhat difficult     Data saved with a previous flowsheet row definition    BP Readings from Last 3 Encounters:  05/12/24 122/80  12/17/23 112/68  06/12/23 120/70    Physical Exam Vitals and nursing note reviewed.  Constitutional:      General: She is not in acute distress.    Appearance: Normal appearance. She is well-developed.  HENT:     Head: Normocephalic and atraumatic.  Cardiovascular:     Rate and Rhythm: Normal rate and regular rhythm.  Pulmonary:     Effort: Pulmonary effort is normal. No respiratory distress.     Breath sounds: Decreased air movement present. Decreased breath sounds present. No wheezing or rhonchi.  Musculoskeletal:     Right lower leg: No edema.     Left lower leg: No edema.  Skin:    General: Skin is warm and dry.     Findings: No rash.  Neurological:     Mental Status: She is alert and oriented to person, place, and time.  Psychiatric:        Attention and Perception: Attention normal.        Mood and Affect: Mood normal.        Behavior: Behavior normal.     Wt Readings from Last 3 Encounters:  05/12/24 111 lb (50.3 kg)  12/17/23 111 lb (50.3 kg)  06/12/23 109 lb 9.6 oz (49.7 kg)    BP 122/80 (BP Location: Left Arm, Patient Position: Sitting, Cuff Size: Normal)   Ht 5' 4 (1.626  m)   Wt 111 lb (50.3 kg)   BMI 19.05 kg/m   Assessment and Plan:  Problem List Items Addressed This Visit       Unprioritized   Hypothyroidism due to acquired atrophy of thyroid  (Chronic)   Supplemented Lab Results  Component Value Date   TSH 0.625 01/04/2024         GERD without esophagitis - Primary (Chronic)   Currently taking Pepcid or omeprazole  as needed with minimal reflux symptoms. Patient denies red flag symptoms - no melena, weight loss, dysphagia. Will maintain current management.       Mild intermittent asthma (Chronic)   Stable mild symptoms but unfortunately she continues to smoke cigarettes. Continue Albuterol  PRN and Trelegy daily. Trelegy refilled.      Relevant Medications   Fluticasone -Umeclidin-Vilant (TRELEGY ELLIPTA ) 100-62.5-25 MCG/ACT AEPB   Other Visit Diagnoses       Encounter for screening mammogram for breast cancer       MM has been ordered; she is reminded to schedule.       No follow-ups on file.    Leita HILARIO Adie, MD [facility] [place]

## 2024-05-12 NOTE — Assessment & Plan Note (Signed)
 Supplemented Lab Results  Component Value Date   TSH 0.625 01/04/2024

## 2024-05-12 NOTE — Patient Instructions (Signed)
 Call [facility] to schedule your mammogram at [phone].

## 2024-05-17 DIAGNOSIS — M25562 Pain in left knee: Secondary | ICD-10-CM | POA: Diagnosis not present

## 2024-05-17 DIAGNOSIS — M25561 Pain in right knee: Secondary | ICD-10-CM | POA: Diagnosis not present

## 2024-05-17 DIAGNOSIS — R600 Localized edema: Secondary | ICD-10-CM | POA: Diagnosis not present

## 2024-06-13 ENCOUNTER — Telehealth: Payer: Self-pay

## 2024-06-13 NOTE — Telephone Encounter (Signed)
 Copied from CRM [id number]. Topic: Appointments - Scheduling Inquiry for Clinic >> Jun 13, 2024 12:42 PM Harlene ORN wrote: Reason for CRM: Patient called. Would like her AWV that is scheduled for 01/08 for a video visit. She would like to have this switched to telephone.

## 2024-06-14 NOTE — Telephone Encounter (Signed)
"  Great. Thank you for the update.   "

## 2024-06-16 ENCOUNTER — Ambulatory Visit: Payer: Self-pay

## 2024-06-16 VITALS — Ht 64.0 in | Wt 111.0 lb

## 2024-06-16 DIAGNOSIS — Z0000 Encounter for general adult medical examination without abnormal findings: Secondary | ICD-10-CM | POA: Diagnosis not present

## 2024-06-16 NOTE — Progress Notes (Addendum)
 " Please attest and cosign this visit due to patients primary care provider not being in the office at the time the visit was completed.    Chief Complaint  Patient presents with   Medicare Wellness     Subjective:   Lindsey Golden is a 79 y.o. female who presents for a Medicare Annual Wellness Visit.  Visit info / Clinical Intake: Medicare Wellness Visit Type:: Subsequent Annual Wellness Visit Persons participating in visit and providing information:: patient Medicare Wellness Visit Mode:: Telephone If telephone:: video declined Since this visit was completed virtually, some vitals may be partially provided or unavailable. Missing vitals are due to the limitations of the virtual format.: Unable to obtain vitals - no equipment If Telephone or Video please confirm:: I connected with patient using audio/video enable telemedicine. I verified patient identity with two identifiers, discussed telehealth limitations, and patient agreed to proceed. Patient Location:: home Provider Location:: office Interpreter Needed?: No Pre-visit prep was completed: yes AWV questionnaire completed by patient prior to visit?: yes Date:: 06/17/23 Living arrangements:: (Patient-Rptd) lives with spouse/significant other Patient's Overall Health Status Rating: (Patient-Rptd) very good Typical amount of pain: (Patient-Rptd) some Does pain affect daily life?: (Patient-Rptd) no Are you currently prescribed opioids?: no  Dietary Habits and Nutritional Risks How many meals a day?: (Patient-Rptd) 3 Eats fruit and vegetables daily?: (Patient-Rptd) yes Most meals are obtained by: (Patient-Rptd) preparing own meals In the last 2 weeks, have you had any of the following?: none Diabetic:: no  Functional Status Activities of Daily Living (to include ambulation/medication): (Patient-Rptd) Independent Ambulation: (Patient-Rptd) Independent Medication Administration: (Patient-Rptd) Independent Home Management  (perform basic housework or laundry): (Patient-Rptd) Independent Manage your own finances?: (Patient-Rptd) yes Primary transportation is: (Patient-Rptd) driving Concerns about vision?: no *vision screening is required for WTM* Concerns about hearing?: no  Fall Screening Falls in the past year?: (Patient-Rptd) 0 Number of falls in past year: 0 Was there an injury with Fall?: 0 Fall Risk Category Calculator: 0 Patient Fall Risk Level: Low Fall Risk  Fall Risk Patient at Risk for Falls Due to: No Fall Risks Fall risk Follow up: Falls prevention discussed; Education provided; Falls evaluation completed  Home and Transportation Safety: All rugs have non-skid backing?: (Patient-Rptd) yes All stairs or steps have railings?: (Patient-Rptd) yes Grab bars in the bathtub or shower?: (Patient-Rptd) yes Have non-skid surface in bathtub or shower?: (Patient-Rptd) yes Good home lighting?: (Patient-Rptd) yes Regular seat belt use?: (Patient-Rptd) yes Hospital stays in the last year:: (Patient-Rptd) no  Cognitive Assessment Difficulty concentrating, remembering, or making decisions? : (Patient-Rptd) no Will 6CIT or Mini Cog be Completed: yes What year is it?: 0 points What month is it?: 0 points Give patient an address phrase to remember (5 components): [address] [place]  About what time is it?: 0 points Count backwards from 20 to 1: 0 points Say the months of the year in reverse: 0 points Repeat the address phrase from earlier: 0 points 6 CIT Score: 0 points  Advance Directives (For Healthcare) Does Patient Have a Medical Advance Directive?: Yes Does patient want to make changes to medical advance directive?: No - Patient declined Type of Advance Directive: Healthcare Power of [place]; Living will Copy of Healthcare Power of Attorney in Chart?: No - copy requested Copy of Living Will in Chart?: No - copy requested  Reviewed/Updated  Reviewed/Updated: Reviewed All (Medical,  Surgical, Family, Medications, Allergies, Care Teams, Patient Goals)    Allergies (verified) Aspirin, Penicillins, and Dairy aid [tilactase]  Current Medications (verified) Outpatient Encounter Medications as of 06/16/2024  Medication Sig   CALCIUM-VITAMIN D  PO Take by mouth daily.   famotidine (PEPCID) 10 MG tablet Take 10 mg by mouth daily.   ferrous sulfate 325 (65 FE) MG tablet Take 325 mg by mouth daily with breakfast.   Fluticasone -Umeclidin-Vilant (TRELEGY ELLIPTA ) 100-62.5-25 MCG/ACT AEPB Inhale 1 puff into the lungs daily at 6 (six) AM.   gabapentin  (NEURONTIN ) 100 MG capsule Take 1 capsule (100 mg total) by mouth 4 (four) times daily.   levothyroxine  (SYNTHROID ) 75 MCG tablet TAKE 1 TABLET BY MOUTH ONCE DAILY ON  AN  EMPTY  STOMACH  -  WAIT  30  MINUTES  BEFORE  TAKING  OTHER  MEDICATIONS   Multiple Vitamins-Minerals (MULTIPLE VITAMINS/WOMENS PO) Take by mouth.   omeprazole  (PRILOSEC) 20 MG capsule Take 1 capsule by mouth once daily   VENTOLIN  HFA 108 (90 Base) MCG/ACT inhaler INHALE 2 PUFFS BY MOUTH EVERY 6 HOURS AS NEEDED FOR WHEEZING FOR SHORTNESS OF BREATH   Zoledronic  Acid (RECLAST  IV) Inject into the vein. April   No facility-administered encounter medications on file as of 06/16/2024.    History: Past Medical History:  Diagnosis Date   Allergy    Asthma    mild intermittent due to allergies.   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 Viktoria   COLONOSCOPY N/A 02/20/2022   Procedure: COLONOSCOPY;  Surgeon: Jinny Carmine, MD;  Location: [facility];  Service: Endoscopy;  Laterality: N/A;   ESOPHAGOGASTRODUODENOSCOPY N/A 02/20/2022   Procedure: ESOPHAGOGASTRODUODENOSCOPY (EGD) WITH BIOPSY;  Surgeon: Jinny Carmine, MD;  Location: [facility];  Service: Endoscopy;  Laterality: N/A;   KNEE SURGERY     five different surgery   Family History  Problem Relation Age of Onset   Depression Mother    Hearing loss Mother    Mental illness Mother    Miscarriages / Stillbirths Mother    Asthma Father    Kidney disease Father    Stroke Father    Varicose Veins Father    Breast cancer Neg Hx    Social History   Occupational History   Occupation: retired  Tobacco Use   Smoking status: Every Day    Current packs/day: 0.25    Average packs/day: 0.3 packs/day for 58.0 years (14.5 ttl pk-yrs)    Types: Cigarettes    Start date: 1968   Smokeless tobacco: Never   Tobacco comments:    4-5 cigarettes daily.  Off and on since age 40  Vaping Use   Vaping status: Never Used  Substance and Sexual Activity   Alcohol use: Yes    Comment: 1 glass of wine 1-2 times per month   Drug use: No   Sexual activity: Not on file   Tobacco Counseling Ready to quit: Not Answered Counseling given: Not Answered Tobacco comments: 4-5 cigarettes daily.  Off and on since age 27  SDOH Screenings   Food Insecurity: No Food Insecurity (06/16/2024)  Housing: Unknown (06/16/2024)  Transportation Needs: No Transportation Needs (06/16/2024)  Utilities: Not At Risk (06/16/2024)  Alcohol Screen: Low Risk (06/16/2024)  Depression (PHQ2-9): Low Risk (06/16/2024)  Financial Resource Strain: Low Risk (06/16/2024)  Physical Activity: Insufficiently Active (06/16/2024)  Social Connections: Moderately Integrated (06/16/2024)  Stress: No Stress Concern Present (06/16/2024)  Tobacco Use: High Risk (06/16/2024)  Health Literacy: Adequate Health Literacy (06/16/2024)   See flowsheets for full screening details  Depression Screen PHQ 2 & 9 Depression Scale- Over the past 2 weeks, how often have you been bothered by any of the following problems? Little interest or pleasure in doing things: 0 Feeling down, depressed, or hopeless (PHQ Adolescent also includes...irritable):  0 PHQ-2 Total Score: 0 Trouble falling or staying asleep, or sleeping too much: 0 Feeling tired or having little energy: 0 Poor appetite or overeating (PHQ Adolescent also includes...weight loss): 0 Feeling bad about yourself - or that you are a failure or have let yourself or your family down: 0 Trouble concentrating on things, such as reading the newspaper or watching television (PHQ Adolescent also includes...like school work): 0 Moving or speaking so slowly that other people could have noticed. Or the opposite - being so fidgety or restless that you have been moving around a lot more than usual: 0 Thoughts that you would be better off dead, or of hurting yourself in some way: 0 PHQ-9 Total Score: 0 If you checked off any problems, how difficult have these problems made it for you to do your work, take care of things at home, or get along with other people?: Somewhat difficult  Depression Treatment Depression Interventions/Treatment : Counseling     Goals Addressed               This Visit's Progress     I want to to exercise, eat fresh food        Patient Stated (pt-stated)   Not on track     Quit smoking and get through the grieving process after her brother's recent death      Quit smoking / using tobacco   Not on track     Recommend to attend smoking cessation classes or contact 1-800-QUIT-NOW             Objective:    Today's Vitals   06/16/24 1338  Weight: 111 lb (50.3 kg)  Height: 5' 4 (1.626 m)   Body mass index is 19.05 kg/m.  Hearing/Vision screen No results found. Immunizations and Health Maintenance Health Maintenance  Topic Date Due   Mammogram  01/28/2024   DTaP/Tdap/Td (2 - Td or Tdap) 09/28/2024   COVID-19 Vaccine (8 - 2025-26 season) 11/03/2024   Medicare Annual Wellness (AWV)  06/16/2025   Colonoscopy  02/21/2027   Pneumococcal Vaccine: 50+ Years  Completed   Influenza Vaccine  Completed   Bone Density Scan  Completed   Hepatitis C  Screening  Completed   Zoster Vaccines- Shingrix  Completed   Meningococcal B Vaccine  Aged Out        Assessment/Plan:  This is a routine wellness examination for [place].  Patient Care Team: Damian Therisa HERO, MD as Physician Assistant (Endocrinology) Sharlot Agent, OD [facility])  I have personally reviewed and noted the following in the patients chart:   Medical and social history Use of alcohol, tobacco or illicit drugs  Current medications and supplements including opioid prescriptions. Functional ability and status Nutritional status Physical activity Advanced directives List of other physicians Hospitalizations, surgeries, and ER visits in previous 12 months Vitals Screenings to include cognitive, depression, and falls Referrals and appointments  No orders of the defined types were placed in this encounter.  In addition, I have reviewed and discussed  with patient certain preventive protocols, quality metrics, and best practice recommendations. A written personalized care plan for preventive services as well as general preventive health recommendations were provided to patient.   Erminio LITTIE Saris, LPN   01/08/7972   Return in 1 year (on 06/16/2025) for annual wellness.  After Visit Summary: (MyChart) Due to this being a telephonic visit, the after visit summary with patients personalized plan was offered to patient via MyChart   Nurse Notes: No voiced or noted concerns at this time Patient advised to keep follow-up appointment with PCP (09/13/24) Appointment(s) made: (CPE July 2026) HM Addressed: none  "

## 2024-06-16 NOTE — Patient Instructions (Signed)
 Lindsey Golden,  Thank you for taking the time for your Medicare Wellness Visit. I appreciate your continued commitment to your health goals. Please review the care plan we discussed, and feel free to reach out if I can assist you further.  Please note that Annual Wellness Visits do not include a physical exam. Some assessments may be limited, especially if the visit was conducted virtually. If needed, we may recommend an in-person follow-up with your provider.  Ongoing Care Seeing your primary care provider every 3 to 6 months helps us  monitor your health and provide consistent, personalized care.   Referrals If a referral was made during today's visit and you haven't received any updates within two weeks, please contact the referred provider directly to check on the status.  Recommended Screenings:  Health Maintenance  Topic Date Due   Breast Cancer Screening  01/28/2024   Medicare Annual Wellness Visit  05/13/2024   DTaP/Tdap/Td vaccine (2 - Td or Tdap) 09/28/2024   COVID-19 Vaccine (8 - 2025-26 season) 11/03/2024   Colon Cancer Screening  02/21/2027   Pneumococcal Vaccine for age over 9  Completed   Flu Shot  Completed   Osteoporosis screening with Bone Density Scan  Completed   Hepatitis C Screening  Completed   Zoster (Shingles) Vaccine  Completed   Meningitis B Vaccine  Aged Out       06/16/2024   10:56 AM  Advanced Directives  Does Patient Have a Medical Advance Directive? Yes  Type of Estate Agent of [place];Living will  Does patient want to make changes to medical advance directive? No - Patient declined  Copy of Healthcare Power of Attorney in Chart? No - copy requested    Vision: Annual vision screenings are recommended for early detection of glaucoma, cataracts, and diabetic retinopathy. These exams can also reveal signs of chronic conditions such as diabetes and high blood pressure.  Dental: Annual dental screenings help detect early signs  of oral cancer, gum disease, and other conditions linked to overall health, including heart disease and diabetes.

## 2024-06-28 ENCOUNTER — Ambulatory Visit
Admission: RE | Admit: 2024-06-28 | Discharge: 2024-06-28 | Disposition: A | Discharge status: Home / Self Care | Source: Ambulatory Visit | Attending: Internal Medicine | Admitting: Internal Medicine

## 2024-06-28 DIAGNOSIS — Z1231 Encounter for screening mammogram for malignant neoplasm of breast: Secondary | ICD-10-CM | POA: Insufficient documentation

## 2024-07-01 ENCOUNTER — Other Ambulatory Visit: Payer: Self-pay | Admitting: Student

## 2024-07-01 DIAGNOSIS — R202 Paresthesia of skin: Secondary | ICD-10-CM

## 2024-07-01 MED ORDER — GABAPENTIN 100 MG PO CAPS: 100.0000 mg | ORAL_CAPSULE | Freq: Four times a day (QID) | ORAL | 1 refills | Status: AC

## 2024-07-01 NOTE — Telephone Encounter (Signed)
 Requested Prescriptions  Pending Prescriptions Disp Refills   gabapentin  (NEURONTIN ) 100 MG capsule 360 capsule 1    Sig: Take 1 capsule (100 mg total) by mouth 4 (four) times daily.     Neurology: Anticonvulsants - gabapentin  Passed - 07/01/2024  3:56 PM      Passed - Cr in normal range and within 360 days    Creat  Date Value Ref Range Status  09/02/2016 0.65 0.50 - 0.99 mg/dL Final    Comment:      For patients > or = 77 years of age: The upper reference limit for Creatinine is approximately 13% higher for people identified as African-American.      Creatinine, Ser  Date Value Ref Range Status  01/04/2024 0.77 0.57 - 1.00 mg/dL Final         Passed - Completed PHQ-2 or PHQ-9 in the last 360 days      Passed - Valid encounter within last 12 months    Recent Outpatient Visits           1 month ago GERD without esophagitis   [place] [facility], Leita DEL, MD   6 months ago Hypothyroidism due to acquired atrophy of thyroid    [facility] at [facility], Leita DEL, MD

## 2024-07-01 NOTE — Telephone Encounter (Signed)
 Copied from CRM [id number]. Topic: Clinical - Medication Refill >> Jul 01, 2024  3:42 PM Lindsey Golden wrote: Medication: gabapentin  (NEURONTIN ) 100 MG capsule  Has the patient contacted their pharmacy? Yes   This is the patient's preferred pharmacy:  [facility] [address], [place] - [address] [place] [address] Phone: [phone] Fax: [phone]  Is this the correct pharmacy for this prescription? Yes If no, delete pharmacy and type the correct one.   Has the prescription been filled recently? No  Is the patient out of the medication? No but she is running low  Has the patient been seen for an appointment in the last year OR does the patient have an upcoming appointment? Yes  Can we respond through MyChart? Yes but would prefer a call or text  Please assist patient further

## 2024-07-11 ENCOUNTER — Ambulatory Visit: Payer: Self-pay | Admitting: *Deleted

## 2024-07-11 NOTE — Telephone Encounter (Signed)
" °  FYI Only or Action Required?: FYI only for provider: appointment scheduled on 07/13/24.  Patient was last seen in primary care on 05/12/2024 by Justus Leita DEL, MD.  Called Nurse Triage reporting Abdominal Pain.  Symptoms began yesterday.  Interventions attempted: Prescription medications: prilosec and pepcid .  Symptoms are: unchanged.  Triage Disposition: See Physician Within 24 Hours  Patient/caregiver understands and will follow disposition?: Yes                 Reason for Disposition  [1] MILD pain (e.g., does not interfere with normal activities) AND [2] comes and goes (cramps) AND [3] present > 72 hours  (Exception: This same abdominal pain is a chronic symptom recurrent or ongoing AND present > 4 weeks.)  Answer Assessment - Initial Assessment Questions Patient requesting appt 07/13/24 due to inclement weather and unable to get out of her driveway until snow melts. Recommended if sx worsen call 911 and go to ED. Patient reports not that bad.       1. LOCATION: Where does it hurt?      Upper abdominal area right side and center of chest under right breast area 2. RADIATION: Does the pain shoot anywhere else? (e.g., chest, back)     No  3. ONSET: When did the pain begin? (e.g., minutes, hours or days ago)      Yesterday , woke up this am with sx  4. SUDDEN: Gradual or sudden onset?     Gradual  5. PATTERN Does the pain come and go, or is it constant?     Comes and goes for few hours  6. SEVERITY: How bad is the pain?  (e.g., Scale 1-10; mild, moderate, or severe)     Mild  7. RECURRENT SYMPTOM: Have you ever had this type of stomach pain before? If Yes, ask: When was the last time? and What happened that time?      Yes acid reflux, taking medications 8. AGGRAVATING FACTORS: Does anything seem to cause this pain? (e.g., foods, stress, alcohol)     Did not report 9. CARDIAC SYMPTOMS: Do you have any of the following symptoms: chest  pain, difficulty breathing, sweating, nausea?     Denies chest pain , reports chest burning center and right side at times. No difficulty breathing , no fever no sweating no nausea.  10. OTHER SYMPTOMS: Do you have any other symptoms? (e.g., back pain, diarrhea, fever, urination pain, vomiting)       Upper abdominal pain, chest burning right side and center of chest and under right breast at times. Hx acid reflux, taking prilosec, and pepcid. . No fever no V/D.  Protocols used: Abdominal Pain - Upper-A-AH  "

## 2024-07-12 NOTE — Telephone Encounter (Signed)
Noted  Pt has an appt  KP 

## 2024-07-13 ENCOUNTER — Ambulatory Visit: Admitting: Student

## 2024-07-13 ENCOUNTER — Encounter: Payer: Self-pay | Admitting: Student

## 2024-07-13 VITALS — BP 125/80 | HR 90 | Temp 97.7°F | Ht 64.0 in | Wt 114.0 lb

## 2024-07-13 DIAGNOSIS — R739 Hyperglycemia, unspecified: Secondary | ICD-10-CM

## 2024-07-13 DIAGNOSIS — R1013 Epigastric pain: Secondary | ICD-10-CM

## 2024-07-13 MED ORDER — VENTOLIN HFA 108 (90 BASE) MCG/ACT IN AERS: 2.0000 | INHALATION_SPRAY | Freq: Four times a day (QID) | RESPIRATORY_TRACT | 2 refills | Status: AC | PRN

## 2024-07-13 NOTE — Progress Notes (Unsigned)
 "  Established Patient Office Visit  Subjective   Patient ID: Lindsey Golden, female    DOB: 10-24-1945  Age: 79 y.o. MRN: [id number]  Chief Complaint  Patient presents with   Abdominal Pain    Right side abdominal pain under breast with heart burn, off and on since new years, every two or three days    Lindsey Golden is a 79 y.o. person with medical hx listed below who presents today for burning right sided substernal pain and RUQ pain, lasting a few hours. Usually occurs in the morning after waking up occurs 3 days a week. Takes omeprazole  20 mg once daily and taking pepcid when she has the pain with improvement. Denies fever, n/v/d, diaphoresis, dyspnea. Not associated with exertion, can improve with eating at times. No constipation of change in stool patterns  no weight loss or trouble swallowing   Patient Active Problem List   Diagnosis Date Noted   Benign essential tremor 12/15/2022   History of colonic polyps    Gastritis without bleeding    Mild intermittent asthma 07/10/2021   Lower leg edema 12/17/2020   Mixed hyperlipidemia 12/17/2020   History of Helicobacter pylori infection 07/01/2019   GERD without esophagitis 06/08/2019   Age-related osteoporosis without current pathological fracture 06/08/2019   Hypothyroidism due to acquired atrophy of thyroid  11/02/2018   Vitamin D  deficiency 02/16/2018   Tingling in extremities 02/16/2018   Myalgia 02/02/2018   Adenomatous colon polyp 10/07/2017   AVM (arteriovenous malformation) of small bowel, 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   famotidine (PEPCID) 10 MG tablet Take 10 mg by mouth daily.   ferrous sulfate 325 (65 FE) MG tablet Take 325 mg by mouth daily with  breakfast.   Fluticasone -Umeclidin-Vilant (TRELEGY ELLIPTA ) 100-62.5-25 MCG/ACT AEPB Inhale 1 puff into the lungs daily at 6 (six) AM.   gabapentin  (NEURONTIN ) 100 MG capsule Take 1 capsule (100 mg total) by mouth 4 (four) times daily.   levothyroxine  (SYNTHROID ) 75 MCG tablet TAKE 1 TABLET BY MOUTH ONCE DAILY ON  AN  EMPTY  STOMACH  -  WAIT  30  MINUTES  BEFORE  TAKING  OTHER  MEDICATIONS   Multiple Vitamins-Minerals (MULTIPLE VITAMINS/WOMENS PO) Take by mouth.   omeprazole  (PRILOSEC) 20 MG capsule Take 1 capsule by mouth once daily   VENTOLIN  HFA 108 (90 Base) MCG/ACT inhaler INHALE 2 PUFFS BY MOUTH EVERY 6 HOURS AS NEEDED FOR WHEEZING FOR SHORTNESS OF BREATH   Zoledronic  Acid (RECLAST  IV) Inject into the vein. April   No facility-administered encounter medications on file as of 07/13/2024.    BP 125/80   Pulse 90   Temp 97.7 F (36.5 C) (Oral)   Ht 5' 4 (1.626 m)   Wt 114 lb (51.7 kg)   SpO2 97%   BMI 19.57 kg/m  BP Readings from Last 3 Encounters:  07/13/24 125/80  05/12/24 122/80  12/17/23 112/68    Physical Exam     07/13/2024    1:33 PM 06/16/2024    1:46 PM 05/12/2024    9:42 AM  Depression screen PHQ 2/9  Decreased Interest 0 0 0  Down, Depressed, Hopeless 0 0 0  PHQ - 2 Score 0 0 0  Altered sleeping   0  Tired, decreased energy   0  Change in appetite   0  Feeling bad or failure about yourself    0  Trouble concentrating   0  Moving slowly or fidgety/restless   0  Suicidal thoughts   0  PHQ-9 Score   0       07/13/2024    1:33 PM 05/12/2024    9:42 AM 12/17/2023    8:47 AM 06/12/2023    8:08 AM  GAD 7 : Generalized Anxiety Score  Nervous, Anxious, on Edge 0 0  2  2   Control/stop worrying 0 0  2  2   Worry too much - different things  0  2  2   Trouble relaxing  0  0  0   Restless  0  0  0   Easily annoyed or irritable  0  0  0   Afraid - awful might happen  0  2  3   Total GAD 7 Score  0 8 9  Anxiety Difficulty  Not difficult at all Somewhat difficult Very  difficult     Data saved with a previous flowsheet row definition    No results found for any visits on 07/13/24.  Last CBC Lab Results  Component Value Date   WBC 11.1 (H) 07/14/2024   HGB 14.0 07/14/2024   HCT 42.5 07/14/2024   MCV 100 (H) 07/14/2024   MCH 32.8 07/14/2024   RDW 11.4 (L) 07/14/2024   PLT 241 07/14/2024   Last metabolic panel Lab Results  Component Value Date   GLUCOSE 100 (H) 07/14/2024   NA 142 07/14/2024   K 4.7 07/14/2024   CL 103 07/14/2024   CO2 23 07/14/2024   BUN 11 07/14/2024   CREATININE 0.77 07/14/2024   EGFR 80 07/14/2024   CALCIUM 9.6 07/14/2024   PROT 6.8 07/14/2024   ALBUMIN 4.3 07/14/2024   LABGLOB 2.5 07/14/2024   AGRATIO 1.9 12/11/2021   BILITOT 0.5 07/14/2024   ALKPHOS 72 07/14/2024   AST 24 07/14/2024   ALT 9 07/14/2024   ANIONGAP 8 05/05/2018   Last lipids Lab Results  Component Value Date   CHOL 182 01/04/2024   HDL 85 01/04/2024   LDLCALC 80 01/04/2024   TRIG 93 01/04/2024   CHOLHDL 2.1 01/04/2024   Last hemoglobin A1c Lab Results  Component Value Date   HGBA1C 5.3 07/14/2024      The 10-year ASCVD risk score (Arnett DK, et al., 2019) is: 24%    Assessment & Plan:  There are no diagnoses linked to this encounter.   No follow-ups on file.    Harlene Saddler, MD "

## 2024-07-13 NOTE — Patient Instructions (Signed)
 It was a pleasure meeting you today  Increase omeprazole  to twice daily Can use pepcid as needed We will check labs and RUQ US    If labs and imaging are normal I will refer you to GI   Please follow up on 4/9

## 2024-07-15 ENCOUNTER — Ambulatory Visit: Payer: Self-pay | Admitting: Student

## 2024-07-15 ENCOUNTER — Other Ambulatory Visit: Payer: Self-pay | Admitting: Student

## 2024-07-15 LAB — COMPREHENSIVE METABOLIC PANEL WITH GFR
ALT: 9 [IU]/L (ref 0–32)
AST: 24 [IU]/L (ref 0–40)
Albumin: 4.3 g/dL (ref 3.8–4.8)
Alkaline Phosphatase: 72 [IU]/L (ref 49–135)
BUN/Creatinine Ratio: 14 (ref 12–28)
BUN: 11 mg/dL (ref 8–27)
Bilirubin Total: 0.5 mg/dL (ref 0.0–1.2)
CO2: 23 mmol/L (ref 20–29)
Calcium: 9.6 mg/dL (ref 8.7–10.3)
Chloride: 103 mmol/L (ref 96–106)
Creatinine, Ser: 0.77 mg/dL (ref 0.57–1.00)
Globulin, Total: 2.5 g/dL (ref 1.5–4.5)
Glucose: 100 mg/dL — ABNORMAL HIGH (ref 70–99)
Potassium: 4.7 mmol/L (ref 3.5–5.2)
Sodium: 142 mmol/L (ref 134–144)
Total Protein: 6.8 g/dL (ref 6.0–8.5)
eGFR: 80 mL/min/{1.73_m2}

## 2024-07-15 LAB — CBC
Hematocrit: 42.5 % (ref 34.0–46.6)
Hemoglobin: 14 g/dL (ref 11.1–15.9)
MCH: 32.8 pg (ref 26.6–33.0)
MCHC: 32.9 g/dL (ref 31.5–35.7)
MCV: 100 fL — ABNORMAL HIGH (ref 79–97)
Platelets: 241 10*3/uL (ref 150–450)
RBC: 4.27 x10E6/uL (ref 3.77–5.28)
RDW: 11.4 % — ABNORMAL LOW (ref 11.7–15.4)
WBC: 11.1 10*3/uL — ABNORMAL HIGH (ref 3.4–10.8)

## 2024-07-15 LAB — HEMOGLOBIN A1C
Est. average glucose Bld gHb Est-mCnc: 105 mg/dL
Hgb A1c MFr Bld: 5.3 % (ref 4.8–5.6)

## 2024-07-15 NOTE — Telephone Encounter (Signed)
" ° ° °  Copied from CRM [id number]. Topic: Clinical - Prescription Issue >> Jul 15, 2024 11:13 AM [name] wrote: Reason for CRM:    ----------------------------------------------------------------------- From previous Reason for Contact - Call Back - No Documentation: Reason for CRM: Pt would like for Dr. Lemon to know- she saw he ron Wednessday and was supposed to get a prescription request for VENTOLIN  HFA 108 (90 Base) MCG/ACT inhaler. Patient is going to [place]  this weekend and needs to get it today.   [facility] [address], [place] - [address] [place] [address] Phone: [phone] Fax: [phone] Hours: Not open 24 hours >> Jul 15, 2024 11:55 AM [name] wrote: Please call patient back - said she rcvd a voicemail but wasn't sure who the person was and she also is concerned she won't get script in time since she is going out of town.  CAL said would relay msg too Reason for Disposition  [1] Prescription not at pharmacy AND [2] was prescribed by doctor (or NP/PA) recently  (Exception: [facility] has access to EMR and prescription is recorded there. Go to Home Care and confirm prescription for pharmacy.)  Answer Assessment - Initial Assessment Questions Returned call to patient to discuss questions about medication refill. Patient informed about elevated white count and denies any fever or worsening pain.   1. DRUG NAME: What medicine do you need to have refilled?     Albuterol  inhaler   4. PRESCRIBER: Who prescribed it? Note: The prescribing doctor or group is responsible for refill approvals.[name]     PCP  5. PHARMACY: Have you contacted your pharmacy (drugstore)? Note: Some pharmacies will contact the doctor (or NP/PA).      Contacted pharmacy this morning  6. SYMPTOMS: Do you have any symptoms?     Denies SOB, difficulty breathing or chest pain; also denies fever or worsening pain  Protocols used: Medication Refill and Renewal Call-A-AH  "

## 2024-07-15 NOTE — Telephone Encounter (Signed)
 Called [facility] pharmacy to get an update on medication. Pharmacist informed me that rx was received and they were working on it but it was not ready to be picked up.   Called patient to relay the information from the [facility] pharmacist, there was no answer to my phone call. Left patient a voicemail message with this information and to call our office if she had any questions.

## 2024-07-28 ENCOUNTER — Ambulatory Visit

## 2024-09-13 ENCOUNTER — Ambulatory Visit: Admitting: Student

## 2024-09-15 ENCOUNTER — Ambulatory Visit: Admitting: Student

## 2024-12-21 ENCOUNTER — Encounter: Admitting: Student
